# Patient Record
Sex: Male | Born: 1955
Health system: Southern US, Community
[De-identification: ages and names within clinical notes are randomized; demographics above are authoritative.]

## PROBLEM LIST (undated history)

## (undated) DIAGNOSIS — E119 Type 2 diabetes mellitus without complications: Secondary | ICD-10-CM

## (undated) DIAGNOSIS — F419 Anxiety disorder, unspecified: Secondary | ICD-10-CM

## (undated) DIAGNOSIS — Z860100 Personal history of colon polyps, unspecified: Secondary | ICD-10-CM

## (undated) DIAGNOSIS — M1990 Unspecified osteoarthritis, unspecified site: Secondary | ICD-10-CM

## (undated) HISTORY — DX: Personal history of colon polyps, unspecified: Z86.0100

## (undated) HISTORY — DX: Personal history of colonic polyps: Z86.010

---

## 2012-08-23 ENCOUNTER — Ambulatory Visit: Payer: Self-pay | Admitting: Family Medicine

## 2013-07-09 ENCOUNTER — Ambulatory Visit: Payer: Self-pay | Admitting: Family Medicine

## 2014-09-26 ENCOUNTER — Encounter: Payer: Self-pay | Admitting: *Deleted

## 2014-10-09 NOTE — Discharge Instructions (Signed)

## 2014-10-10 ENCOUNTER — Ambulatory Visit: Payer: BLUE CROSS/BLUE SHIELD | Admitting: Anesthesiology

## 2014-10-10 ENCOUNTER — Ambulatory Visit
Admission: RE | Admit: 2014-10-10 | Discharge: 2014-10-10 | Disposition: A | Discharge status: Home / Self Care | Payer: BLUE CROSS/BLUE SHIELD | Source: Ambulatory Visit | Attending: Gastroenterology | Admitting: Gastroenterology

## 2014-10-10 ENCOUNTER — Other Ambulatory Visit: Payer: Self-pay | Admitting: Gastroenterology

## 2014-10-10 ENCOUNTER — Encounter
Admission: RE | Disposition: A | Discharge status: Home / Self Care | Payer: Self-pay | Source: Ambulatory Visit | Attending: Gastroenterology

## 2014-10-10 DIAGNOSIS — F419 Anxiety disorder, unspecified: Secondary | ICD-10-CM | POA: Diagnosis not present

## 2014-10-10 DIAGNOSIS — M19049 Primary osteoarthritis, unspecified hand: Secondary | ICD-10-CM | POA: Insufficient documentation

## 2014-10-10 DIAGNOSIS — K640 First degree hemorrhoids: Secondary | ICD-10-CM | POA: Diagnosis not present

## 2014-10-10 DIAGNOSIS — K621 Rectal polyp: Secondary | ICD-10-CM | POA: Insufficient documentation

## 2014-10-10 DIAGNOSIS — F17200 Nicotine dependence, unspecified, uncomplicated: Secondary | ICD-10-CM | POA: Diagnosis not present

## 2014-10-10 DIAGNOSIS — Z79899 Other long term (current) drug therapy: Secondary | ICD-10-CM | POA: Insufficient documentation

## 2014-10-10 DIAGNOSIS — Z86010 Personal history of colonic polyps: Secondary | ICD-10-CM | POA: Diagnosis not present

## 2014-10-10 HISTORY — DX: Anxiety disorder, unspecified: F41.9

## 2014-10-10 HISTORY — PX: COLONOSCOPY: SHX5424

## 2014-10-10 HISTORY — DX: Unspecified osteoarthritis, unspecified site: M19.90

## 2014-10-10 HISTORY — PX: POLYPECTOMY: SHX5525

## 2014-10-10 SURGERY — COLONOSCOPY
Anesthesia: Monitor Anesthesia Care | Wound class: Contaminated

## 2014-10-10 MED ORDER — LIDOCAINE HCL (CARDIAC) 20 MG/ML IV SOLN
INTRAVENOUS | Status: DC | PRN
  Administered 2014-10-10: 50 mg via INTRAVENOUS

## 2014-10-10 MED ORDER — PROPOFOL 10 MG/ML IV BOLUS
INTRAVENOUS | Status: DC | PRN
  Administered 2014-10-10: 50 mg via INTRAVENOUS
  Administered 2014-10-10: 30 mg via INTRAVENOUS
  Administered 2014-10-10 (×2): 50 mg via INTRAVENOUS
  Administered 2014-10-10: 150 mg via INTRAVENOUS
  Administered 2014-10-10: 20 mg via INTRAVENOUS

## 2014-10-10 MED ORDER — LACTATED RINGERS IV SOLN
INTRAVENOUS | Status: DC
  Administered 2014-10-10 (×2): via INTRAVENOUS

## 2014-10-10 MED ORDER — STERILE WATER FOR IRRIGATION IR SOLN
Status: DC | PRN
  Administered 2014-10-10: 08:00:00

## 2014-10-10 SURGICAL SUPPLY — 28 items

## 2014-10-10 NOTE — Anesthesia Postprocedure Evaluation (Signed)
  Anesthesia Post-op Note  Patient: Daniel Snyder  Procedure(s) Performed: Procedure(s) with comments: COLONOSCOPY (N/A) - PT WANTS EARLY POLYPECTOMY  Anesthesia type:MAC  Patient location: PACU  Post pain: Pain level controlled  Post assessment: Post-op Vital signs reviewed, Patient's Cardiovascular Status Stable, Respiratory Function Stable, Patent Airway and No signs of Nausea or vomiting  Post vital signs: Reviewed and stable  Last Vitals:  Filed Vitals:   10/10/14 0830  BP: 122/82  Pulse: 82  Temp:   Resp: 21    Level of consciousness: awake, alert  and patient cooperative  Complications: No apparent anesthesia complications

## 2014-10-10 NOTE — Anesthesia Procedure Notes (Signed)
Procedure Name: MAC Performed by: Josanne Boerema Pre-anesthesia Checklist: Patient identified, Emergency Drugs available, Suction available, Patient being monitored and Timeout performed Patient Re-evaluated:Patient Re-evaluated prior to inductionOxygen Delivery Method: Nasal cannula Placement Confirmation: positive ETCO2 and breath sounds checked- equal and bilateral     

## 2014-10-10 NOTE — Anesthesia Preprocedure Evaluation (Signed)
Anesthesia Evaluation  Patient identified by MRN, date of birth, ID band Patient awake    Reviewed: Allergy & Precautions, NPO status , Patient's Chart, lab work & pertinent test results  Airway Mallampati: II  TM Distance: >3 FB Neck ROM: Full    Dental no notable dental hx.    Pulmonary neg pulmonary ROS, Current Smoker,  breath sounds clear to auscultation  Pulmonary exam normal       Cardiovascular negative cardio ROS Normal cardiovascular examRhythm:Regular Rate:Normal     Neuro/Psych Anxiety negative neurological ROS  negative psych ROS   GI/Hepatic negative GI ROS, Neg liver ROS,   Endo/Other  negative endocrine ROS  Renal/GU negative Renal ROS  negative genitourinary   Musculoskeletal negative musculoskeletal ROS (+) Arthritis -,   Abdominal   Peds negative pediatric ROS (+)  Hematology negative hematology ROS (+)   Anesthesia Other Findings   Reproductive/Obstetrics negative OB ROS                             Anesthesia Physical Anesthesia Plan  ASA: II  Anesthesia Plan: MAC   Post-op Pain Management:    Induction: Intravenous  Airway Management Planned: Natural Airway  Additional Equipment:   Intra-op Plan:   Post-operative Plan: Extubation in OR  Informed Consent: I have reviewed the patients History and Physical, chart, labs and discussed the procedure including the risks, benefits and alternatives for the proposed anesthesia with the patient or authorized representative who has indicated his/her understanding and acceptance.   Dental advisory given  Plan Discussed with: CRNA  Anesthesia Plan Comments:         Anesthesia Quick Evaluation

## 2014-10-10 NOTE — Op Note (Signed)
[facility] Patient Name: Daniel Snyder Procedure Date: 10/10/2014 7:54 AM MRN: [id number] Account #: [id number] Date of Birth: 12-02-1955 Admit Type: Outpatient Age: 59 Room: [facility] OR ROOM 01 Gender: Male Note Status: Finalized Procedure:         Colonoscopy Indications:       High risk colon cancer surveillance: Personal history of                     colonic polyps Providers:         [name], MD Referring MD:      Shelbie Ammons. Sherryll Burger (Referring MD) Medicines:         Propofol per Anesthesia Complications:     No immediate complications. Procedure:         Pre-Anesthesia Assessment:                    - Prior to the procedure, a History and Physical was                     performed, and patient medications and allergies were                     reviewed. The patient's tolerance of previous anesthesia                     was also reviewed. The risks and benefits of the procedure                     and the sedation options and risks were discussed with the                     patient. All questions were answered, and informed consent                     was obtained. Prior Anticoagulants: The patient has taken                     no previous anticoagulant or antiplatelet agents. ASA                     Grade Assessment: II - A patient with mild systemic                     disease. After reviewing the risks and benefits, the                     patient was deemed in satisfactory condition to undergo                     the procedure.                    After obtaining informed consent, the colonoscope was                     passed under direct vision. Throughout the procedure, the                     patient's blood pressure, pulse, and oxygen saturations                     were monitored continuously. The was introduced through  the anus and advanced to the the cecum, identified by                     appendiceal orifice and  ileocecal valve. The colonoscopy                     was performed without difficulty. The patient tolerated                     the procedure well. The quality of the bowel preparation                     was good. Findings:      The perianal and digital rectal examinations were normal.      Four sessile polyps were found in the rectum. The polyps were 2 to 3 mm       in size. These polyps were removed with a cold biopsy forceps. Resection       and retrieval were complete.      Non-bleeding internal hemorrhoids were found during retroflexion. The       hemorrhoids were Grade I (internal hemorrhoids that do not prolapse). Impression:        - Four 2 to 3 mm polyps in the rectum. Resected and                     retrieved.                    - Non-bleeding internal hemorrhoids. Recommendation:    - Await pathology results.                    - Repeat colonoscopy in 5 years for surveillance. Procedure Code(s): --- Professional ---                    [phone], Colonoscopy, flexible; with biopsy, single or                     multiple Diagnosis Code(s): --- Professional ---                    Z86.010, Personal history of colonic polyps                    K62.1, Rectal polyp CPT copyright 2014 American Medical Association. All rights reserved. The codes documented in this report are preliminary and upon coder review may  be revised to meet current compliance requirements. [name], MD 10/10/2014 8:15:27 AM This report has been signed electronically. Number of Addenda: 0 Note Initiated On: 10/10/2014 7:54 AM Scope Withdrawal Time: 0 hours 7 minutes 57 seconds  Total Procedure Duration: 0 hours 10 minutes 43 seconds       [facility]

## 2014-10-10 NOTE — H&P (Signed)
  [facility]  [address] [place], [place] [address] Phone: [phone] Fax : [phone]  Primary Care Physician:  No PCP Per Patient Primary Gastroenterologist:  Dr. Servando Snare  Pre-Procedure History & Physical: HPI:  Daniel Snyder is a 59 y.o. male is here for an colonoscopy.   Past Medical History  Diagnosis Date  . Arthritis     fingers  . Anxiety     History reviewed. No pertinent past surgical history.  Prior to Admission medications   Medication Sig Start Date End Date Taking? Authorizing Provider  ALPRAZolam Prudy Feeler) 0.5 MG tablet Take 0.5 mg by mouth as needed for anxiety.   Yes Historical Provider, MD  Cyanocobalamin (VITAMIN B12 PO) Take by mouth.   Yes Historical Provider, MD    Allergies as of 09/13/2014  . (Not on File)    History reviewed. No pertinent family history.  History   Social History  . Marital Status: Married    Spouse Name: N/A  . Number of Children: N/A  . Years of Education: N/A   Occupational History  . Not on file.   Social History Main Topics  . Smoking status: Current Every Day Smoker -- 0.50 packs/day for 20 years  . Smokeless tobacco: Not on file  . Alcohol Use: 2.4 oz/week    4 Cans of beer per week  . Drug Use: Not on file  . Sexual Activity: Not on file   Other Topics Concern  . Not on file   Social History Narrative  . No narrative on file    Review of Systems: See HPI, otherwise negative ROS  Physical Exam: BP 144/93 mmHg  Pulse 89  Temp(Src) 98.4 F (36.9 C) (Temporal)  Resp 16  Ht 5\' 9"  (1.753 m)  Wt 236 lb (107.049 kg)  BMI 34.84 kg/m2  SpO2 95% General:   Alert,  pleasant and cooperative in NAD Head:  Normocephalic and atraumatic. Neck:  Supple; no masses or thyromegaly. Lungs:  Clear throughout to auscultation.    Heart:  Regular rate and rhythm. Abdomen:  Soft, nontender and nondistended. Normal bowel sounds, without guarding, and without rebound.   Neurologic:  Alert and   oriented x4;  grossly normal neurologically.  Impression/Plan: Daniel Snyder is here for an colonoscopy to be performed for histroy of colon polyps  Risks, benefits, limitations, and alternatives regarding  colonoscopy have been reviewed with the patient.  Questions have been answered.  All parties agreeable.   Darlina Rumpf, MD  10/10/2014, 7:50 AM

## 2014-10-10 NOTE — Transfer of Care (Signed)
Immediate Anesthesia Transfer of Care Note  Patient: Daniel Snyder  Procedure(s) Performed: Procedure(s) with comments: COLONOSCOPY (N/A) - PT WANTS EARLY POLYPECTOMY  Patient Location: PACU  Anesthesia Type: MAC  Level of Consciousness: awake, alert  and patient cooperative  Airway and Oxygen Therapy: Patient Spontanous Breathing and Patient connected to supplemental oxygen  Post-op Assessment: Post-op Vital signs reviewed, Patient's Cardiovascular Status Stable, Respiratory Function Stable, Patent Airway and No signs of Nausea or vomiting  Post-op Vital Signs: Reviewed and stable  Complications: No apparent anesthesia complications

## 2014-10-11 ENCOUNTER — Encounter: Payer: Self-pay | Admitting: Gastroenterology

## 2014-10-22 ENCOUNTER — Encounter: Payer: Self-pay | Admitting: Gastroenterology

## 2015-08-27 ENCOUNTER — Ambulatory Visit (INDEPENDENT_AMBULATORY_CARE_PROVIDER_SITE_OTHER): Payer: BLUE CROSS/BLUE SHIELD | Admitting: Family Medicine

## 2015-08-27 ENCOUNTER — Encounter: Payer: Self-pay | Admitting: Family Medicine

## 2015-08-27 ENCOUNTER — Ambulatory Visit
Admission: RE | Admit: 2015-08-27 | Discharge: 2015-08-27 | Disposition: A | Discharge status: Home / Self Care | Payer: BLUE CROSS/BLUE SHIELD | Source: Ambulatory Visit | Attending: Family Medicine | Admitting: Family Medicine

## 2015-08-27 VITALS — BP 118/68 | HR 106 | Temp 99.4°F | Resp 18 | Ht 69.0 in | Wt 236.2 lb

## 2015-08-27 DIAGNOSIS — M25561 Pain in right knee: Secondary | ICD-10-CM | POA: Insufficient documentation

## 2015-08-27 DIAGNOSIS — M7989 Other specified soft tissue disorders: Secondary | ICD-10-CM | POA: Diagnosis not present

## 2015-08-27 DIAGNOSIS — F419 Anxiety disorder, unspecified: Secondary | ICD-10-CM

## 2015-08-27 DIAGNOSIS — M179 Osteoarthritis of knee, unspecified: Secondary | ICD-10-CM | POA: Diagnosis not present

## 2015-08-27 DIAGNOSIS — R208 Other disturbances of skin sensation: Secondary | ICD-10-CM | POA: Diagnosis not present

## 2015-08-27 DIAGNOSIS — R200 Anesthesia of skin: Secondary | ICD-10-CM

## 2015-08-27 DIAGNOSIS — M25421 Effusion, right elbow: Secondary | ICD-10-CM

## 2015-08-27 MED ORDER — ALPRAZOLAM 0.5 MG PO TABS: 0.5000 mg | ORAL_TABLET | Freq: Two times a day (BID) | ORAL | Status: DC | PRN

## 2015-08-27 MED ORDER — ALPRAZOLAM 0.5 MG PO TBDP: 0.5000 mg | ORAL_TABLET | Freq: Every day | ORAL | Status: DC | PRN

## 2015-08-27 NOTE — Progress Notes (Signed)
Name: Daniel FilaDavid K Snyder   MRN: [id number]    DOB: [birth year]   Date:08/27/2015       Progress Note  Subjective  Chief Complaint  Chief Complaint  Patient presents with  . Knee Pain    right knee and ankle  . Bursitis    in right elbow    Anxiety Presents for follow-up visit. The problem has been unchanged. Symptoms include excessive worry and insomnia. The quality of sleep is poor.   His past medical history is significant for anxiety/panic attacks. Past treatments include benzodiazephines.    Pt. Presents for right knee pain and swelling, intermittent, worse on certain days. Pain is worse with activity, relieved with rest and stretching. Feels his knee 'knots up' at times.  R. Elbow pain and swelling, started 2-3 months ago, pain went away but swelling persists, has become puffy. Patient wants it evaluated.  Past Medical History  Diagnosis Date  . Arthritis     fingers  . Anxiety     Past Surgical History  Procedure Laterality Date  . Colonoscopy N/A 10/10/2014    Procedure: COLONOSCOPY;  Surgeon: Midge Miniumarren Wohl, MD;  [facility];  Service: Gastroenterology;  Laterality: N/A;  PT WANTS EARLY  . Polypectomy  10/10/2014    Procedure: POLYPECTOMY;  Surgeon: Midge Miniumarren Wohl, MD;  [facility];  Service: Gastroenterology;;    No family history on file.  Social History   Social History  . Marital Status: Married    Spouse Name: N/A  . Number of Children: N/A  . Years of Education: N/A   Occupational History  . Not on file.   Social History Main Topics  . Smoking status: Current Every Day Smoker -- 0.50 packs/day for 20 years  . Smokeless tobacco: Not on file  . Alcohol Use: 2.4 oz/week    4 Cans of beer per week  . Drug Use: Not on file  . Sexual Activity: Not on file   Other Topics Concern  . Not on file   Social History Narrative    No current outpatient prescriptions on file.  No Known Allergies   Review of Systems   Constitutional: Negative for fever and chills.  Musculoskeletal: Positive for joint pain.  Psychiatric/Behavioral: The patient has insomnia.     Objective  Filed Vitals:   08/27/15 1521  BP: 118/68  Pulse: 106  Temp: 99.4 F (37.4 C)  Resp: 18  Height: 5\' 9"  (1.753 m)  Weight: 236 lb 4 oz (107.162 kg)  SpO2: 97%    Physical Exam  Constitutional: He is oriented to person, place, and time and well-developed, well-nourished, and in no distress.  Musculoskeletal:       Right elbow: He exhibits effusion. He exhibits normal range of motion. No tenderness found.       Right knee: He exhibits no swelling and no erythema. No tenderness found.       Right foot: There is no tenderness, no swelling, normal capillary refill and no deformity.  Boggy area of fluid accumulation in the right elbow, non tender to palpation, non erythematous, normal ROM of right elbow.  Neurological: He is alert and oriented to person, place, and time.  Nursing note and vitals reviewed.    Assessment & Plan  1. Swelling of right elbow joint  - DG Elbow Complete Right; Future - Ambulatory referral to Orthopedic Surgery  2. Right knee pain  - CBC with Differential - Comprehensive Metabolic Panel (CMET) - Uric acid -  DG Knee Complete 4 Views Right; Future - Ambulatory referral to Orthopedic Surgery  3. Numbness in feet We will rule out B12 deficiency has the cause for intermittent paresthesias in right foot. - B12  4. Anxiety Start on Xanax 0.5 mg 1 tablet twice daily for anxiety and insomnia. - ALPRAZolam (XANAX) 0.5 MG tablet; Take 1 tablet (0.5 mg total) by mouth 2 (two) times daily as needed for anxiety.  Dispense: 60 tablet; Refill: 0   Destyne Goodreau Asad A. Fay[facility] 08/27/2015 3:37 PM

## 2015-09-08 DIAGNOSIS — M179 Osteoarthritis of knee, unspecified: Secondary | ICD-10-CM | POA: Insufficient documentation

## 2015-09-08 DIAGNOSIS — M7020 Olecranon bursitis, unspecified elbow: Secondary | ICD-10-CM | POA: Insufficient documentation

## 2015-09-08 DIAGNOSIS — M1710 Unilateral primary osteoarthritis, unspecified knee: Secondary | ICD-10-CM | POA: Insufficient documentation

## 2015-09-08 DIAGNOSIS — M1711 Unilateral primary osteoarthritis, right knee: Secondary | ICD-10-CM | POA: Diagnosis not present

## 2015-09-30 DIAGNOSIS — M7021 Olecranon bursitis, right elbow: Secondary | ICD-10-CM | POA: Diagnosis not present

## 2016-03-09 ENCOUNTER — Encounter: Payer: Self-pay | Admitting: Family Medicine

## 2016-03-09 ENCOUNTER — Ambulatory Visit (INDEPENDENT_AMBULATORY_CARE_PROVIDER_SITE_OTHER): Payer: BLUE CROSS/BLUE SHIELD | Admitting: Family Medicine

## 2016-03-09 DIAGNOSIS — F419 Anxiety disorder, unspecified: Secondary | ICD-10-CM | POA: Diagnosis not present

## 2016-03-09 DIAGNOSIS — L03011 Cellulitis of right finger: Secondary | ICD-10-CM | POA: Diagnosis not present

## 2016-03-09 MED ORDER — MUPIROCIN CALCIUM 2 % EX CREA: 1.0000 "application " | TOPICAL_CREAM | Freq: Two times a day (BID) | CUTANEOUS | 0 refills | Status: DC

## 2016-03-09 MED ORDER — CEPHALEXIN 500 MG PO CAPS: 500.0000 mg | ORAL_CAPSULE | Freq: Three times a day (TID) | ORAL | 0 refills | Status: DC

## 2016-03-09 MED ORDER — ALPRAZOLAM 0.5 MG PO TABS: 0.5000 mg | ORAL_TABLET | Freq: Two times a day (BID) | ORAL | 0 refills | Status: DC | PRN

## 2016-03-09 NOTE — Progress Notes (Signed)
Name: Daniel FilaDavid K Sebo   MRN: [id number]    DOB: 1955/11/04   Date:03/09/2016       Progress Note  Subjective  Chief Complaint  Chief Complaint  Patient presents with  . Hand Injury    Cuts on right hand, index and middle finger pt states infection may have taken place on fingers x1 month    HPI  Pt. Presents for evaluation of two separate lesions on the right hand, first one on right hand index finger, started off a 'black spot' on the finger, slowly got worse and now has redness, peeling, some discharge, and is painful. He has been applying peroxide and Neosporin to the area. The second lesion is on the right middle finger, palmar aspect, started off at the same time, lesion has now turned black and is extending proximally down the finger. It is not draining but intensely painful. He has applied Peroxide and Neosporin to the lesion which did not help. No fever, chills, fatigue, or other constitutional symptoms  Past Medical History:  Diagnosis Date  . Anxiety   . Arthritis    fingers    Past Surgical History:  Procedure Laterality Date  . COLONOSCOPY N/A 10/10/2014   Procedure: COLONOSCOPY;  Surgeon: Midge Miniumarren Wohl, MD;  [facility];  Service: Gastroenterology;  Laterality: N/A;  PT WANTS EARLY  . POLYPECTOMY  10/10/2014   Procedure: POLYPECTOMY;  Surgeon: Midge Miniumarren Wohl, MD;  [facility];  Service: Gastroenterology;;    History reviewed. No pertinent family history.  Social History   Social History  . Marital status: Married    Spouse name: N/A  . Number of children: N/A  . Years of education: N/A   Occupational History  . Not on file.   Social History Main Topics  . Smoking status: Current Every Day Smoker    Packs/day: 0.50    Years: 20.00  . Smokeless tobacco: Never Used  . Alcohol use 2.4 oz/week    4 Cans of beer per week  . Drug use: Unknown  . Sexual activity: Not on file   Other Topics Concern  . Not on file   Social  History Narrative  . No narrative on file     Current Outpatient Prescriptions:  .  ALPRAZolam (XANAX) 0.5 MG tablet, Take 1 tablet (0.5 mg total) by mouth 2 (two) times daily as needed for anxiety., Disp: 60 tablet, Rfl: 0  No Known Allergies   Review of Systems  Constitutional: Negative for chills, fever and malaise/fatigue.    Objective  Vitals:   03/09/16 1403  BP: 120/72  Pulse: (!) 101  Resp: 18  Temp: 98.6 F (37 C)  TempSrc: Oral  SpO2: 96%  Weight: 240 lb 3.2 oz (109 kg)  Height: 5\' 9"  (1.753 m)    Physical Exam  Constitutional: He is oriented to person, place, and time and well-developed, well-nourished, and in no distress.  HENT:  Head: Normocephalic and atraumatic.  Musculoskeletal:       Hands: 1: erythematous tender fluid filled lesion on the right index finger, mildly tender to palpation.   2: dark black colored erythematous intensely tender lesion on the right middle finger, extending proximally down the digit, a small amount of purulent material was self-expressed by the patient.  Neurological: He is alert and oriented to person, place, and time.  Psychiatric: Mood, memory, affect and judgment normal.  Nursing note and vitals reviewed.    Assessment & Plan  1. Paronychia of finger  of right hand Advised to soak hand in chlorhexidine solution, started on oral and topical antimicrobial therapy - cephALEXin (KEFLEX) 500 MG capsule; Take 1 capsule (500 mg total) by mouth 3 (three) times daily.  Dispense: 30 capsule; Refill: 0 - mupirocin cream (BACTROBAN) 2 %; Apply 1 application topically 2 (two) times daily.  Dispense: 15 g; Refill: 0  2. Anxiety Stable and responsive to alprazolam when needed. Refills provided - ALPRAZolam (XANAX) 0.5 MG tablet; Take 1 tablet (0.5 mg total) by mouth 2 (two) times daily as needed for anxiety.  Dispense: 60 tablet; Refill: 0  Lucyann Romano Asad A. Faylene KurtzShah [facility] 03/09/2016 2:11  PM

## 2016-03-31 ENCOUNTER — Encounter: Payer: Self-pay | Admitting: Family Medicine

## 2016-03-31 ENCOUNTER — Ambulatory Visit (INDEPENDENT_AMBULATORY_CARE_PROVIDER_SITE_OTHER): Payer: BLUE CROSS/BLUE SHIELD | Admitting: Family Medicine

## 2016-03-31 VITALS — BP 140/70 | HR 97 | Temp 98.3°F | Resp 18 | Ht 69.0 in | Wt 241.1 lb

## 2016-03-31 DIAGNOSIS — J069 Acute upper respiratory infection, unspecified: Secondary | ICD-10-CM | POA: Diagnosis not present

## 2016-03-31 MED ORDER — HYDROCOD POLST-CPM POLST ER 10-8 MG/5ML PO SUER
5.0000 mL | Freq: Every evening | ORAL | 0 refills | Status: DC | PRN
Start: 2016-03-31 — End: 2016-05-05

## 2016-03-31 MED ORDER — AZITHROMYCIN 250 MG PO TABS: ORAL_TABLET | ORAL | 0 refills | Status: DC

## 2016-03-31 NOTE — Progress Notes (Signed)
Name: Daniel FilaDavid K Snyder   MRN: [id number]    DOB: [birth year]   Date:03/31/2016       Progress Note  Subjective  Chief Complaint  Chief Complaint  Patient presents with  . URI    cough, congested for 1 week  . Blister    on tongue for 1 week    URI   This is a new problem. The current episode started 1 to 4 weeks ago (10 days ago). The problem has been gradually improving. There has been no fever (initially had high fever with symptom onset, now resolved.). Associated symptoms include congestion and coughing. Pertinent negatives include no sinus pain. He has tried decongestant (Nyquil night time cough) for the symptoms. The treatment provided no relief.     Past Medical History:  Diagnosis Date  . Anxiety   . Arthritis    fingers    Past Surgical History:  Procedure Laterality Date  . COLONOSCOPY N/A 10/10/2014   Procedure: COLONOSCOPY;  Surgeon: Midge Miniumarren Wohl, MD;  [facility];  Service: Gastroenterology;  Laterality: N/A;  PT WANTS EARLY  . POLYPECTOMY  10/10/2014   Procedure: POLYPECTOMY;  Surgeon: Midge Miniumarren Wohl, MD;  [facility];  Service: Gastroenterology;;    No family history on file.  Social History   Social History  . Marital status: Married    Spouse name: N/A  . Number of children: N/A  . Years of education: N/A   Occupational History  . Not on file.   Social History Main Topics  . Smoking status: Current Every Day Smoker    Packs/day: 0.50    Years: 20.00  . Smokeless tobacco: Never Used  . Alcohol use 2.4 oz/week    4 Cans of beer per week  . Drug use: Unknown  . Sexual activity: Not on file   Other Topics Concern  . Not on file   Social History Narrative  . No narrative on file     Current Outpatient Prescriptions:  .  ALPRAZolam (XANAX) 0.5 MG tablet, Take 1 tablet (0.5 mg total) by mouth 2 (two) times daily as needed for anxiety., Disp: 60 tablet, Rfl: 0  No Known Allergies   Review of Systems  HENT:  Positive for congestion. Negative for sinus pain.   Respiratory: Positive for cough.    Please see history of present illness for complete discussion of review of systems  Objective  Vitals:   03/31/16 1119  BP: 140/70  Pulse: 97  Resp: 18  Temp: 98.3 F (36.8 C)  TempSrc: Oral  SpO2: 97%  Weight: 241 lb 1.6 oz (109.4 kg)  Height: 5\' 9"  (1.753 m)    Physical Exam  Constitutional: He is well-developed, well-nourished, and in no distress.  HENT:  Right Ear: Tympanic membrane and ear canal normal.  Left Ear: Tympanic membrane and ear canal normal.  Mouth/Throat: Posterior oropharyngeal erythema present. No oropharyngeal exudate or posterior oropharyngeal edema.    Ulcerated lesion on the left side of tongue, no drainage, appears to be a fever blister  Cardiovascular: Normal rate, regular rhythm and normal heart sounds.   No murmur heard. Pulmonary/Chest: Effort normal and breath sounds normal. He has no wheezes.  Nursing note and vitals reviewed.     Assessment & Plan  1. URI with cough and congestion Started on azithromycin and Tussionex for relief of coughing, oral lesion is likely a blister caused by the acute URI and should resolve. Reassured - azithromycin (ZITHROMAX) 250 MG tablet; 2  tabs po day 1, then 1 tab po q day x 4 days  Dispense: 6 tablet; Refill: 0 - chlorpheniramine-HYDROcodone (TUSSIONEX PENNKINETIC ER) 10-8 MG/5ML SUER; Take 5 mLs by mouth at bedtime as needed for cough.  Dispense: 50 mL; Refill: 0   Aslin Farinas Asad A. Faylene KurtzShah [facility] 03/31/2016 11:37 AM

## 2016-04-13 ENCOUNTER — Other Ambulatory Visit: Payer: Self-pay | Admitting: Family Medicine

## 2016-04-13 DIAGNOSIS — F419 Anxiety disorder, unspecified: Secondary | ICD-10-CM

## 2016-05-05 ENCOUNTER — Encounter: Payer: Self-pay | Admitting: Family Medicine

## 2016-05-05 ENCOUNTER — Ambulatory Visit (INDEPENDENT_AMBULATORY_CARE_PROVIDER_SITE_OTHER): Payer: BLUE CROSS/BLUE SHIELD | Admitting: Family Medicine

## 2016-05-05 DIAGNOSIS — Z0000 Encounter for general adult medical examination without abnormal findings: Secondary | ICD-10-CM | POA: Diagnosis not present

## 2016-05-05 DIAGNOSIS — E785 Hyperlipidemia, unspecified: Secondary | ICD-10-CM

## 2016-05-05 DIAGNOSIS — E1165 Type 2 diabetes mellitus with hyperglycemia: Secondary | ICD-10-CM | POA: Diagnosis not present

## 2016-05-05 DIAGNOSIS — E1169 Type 2 diabetes mellitus with other specified complication: Secondary | ICD-10-CM | POA: Insufficient documentation

## 2016-05-05 DIAGNOSIS — IMO0001 Reserved for inherently not codable concepts without codable children: Secondary | ICD-10-CM

## 2016-05-05 LAB — COMPLETE METABOLIC PANEL WITH GFR
ALT: 10 U/L (ref 9–46)
AST: 15 U/L (ref 10–35)
Albumin: 4.1 g/dL (ref 3.6–5.1)
Alkaline Phosphatase: 69 U/L (ref 40–115)
BUN: 7 mg/dL (ref 7–25)
CO2: 27 mmol/L (ref 20–31)
Calcium: 9.4 mg/dL (ref 8.6–10.3)
Chloride: 103 mmol/L (ref 98–110)
Creat: 0.85 mg/dL (ref 0.70–1.33)
GFR, Est African American: >89 mL/min (ref >=60)
GFR, Est Non African American: >89 mL/min (ref >=60)
Glucose, Bld: 201 mg/dL — ABNORMAL HIGH (ref 65–99)
Potassium: 4.5 mmol/L (ref 3.5–5.3)
Sodium: 136 mmol/L (ref 135–146)
Total Bilirubin: 0.8 mg/dL (ref 0.2–1.2)
Total Protein: 7.5 g/dL (ref 6.1–8.1)

## 2016-05-05 LAB — CBC WITH DIFFERENTIAL/PLATELET
Basophils Absolute: 0 cells/uL (ref 0–200)
Basophils Relative: 0 %
Eosinophils Absolute: 68 cells/uL (ref 15–500)
Eosinophils Relative: 2 %
HCT: 36.6 % — ABNORMAL LOW (ref 38.5–50.0)
Hemoglobin: 12.5 g/dL — ABNORMAL LOW (ref 13.2–17.1)
Lymphocytes Relative: 36 %
Lymphs Abs: 1224 cells/uL (ref 850–3900)
MCH: 30.4 pg (ref 27.0–33.0)
MCHC: 34.2 g/dL (ref 32.0–36.0)
MCV: 89.1 fL (ref 80.0–100.0)
MPV: 9.5 fL (ref 7.5–12.5)
Monocytes Absolute: 68 cells/uL — ABNORMAL LOW (ref 200–950)
Monocytes Relative: 2 %
Neutro Abs: 2040 cells/uL (ref 1500–7800)
Neutrophils Relative %: 60 %
Platelets: 234 10*3/uL (ref 140–400)
RBC: 4.11 MIL/uL — ABNORMAL LOW (ref 4.20–5.80)
RDW: 14.4 % (ref 11.0–15.0)
WBC: 3.4 10*3/uL — ABNORMAL LOW (ref 3.8–10.8)

## 2016-05-05 LAB — LIPID PANEL
Cholesterol: 234 mg/dL — ABNORMAL HIGH (ref <200)
HDL: 37 mg/dL — ABNORMAL LOW (ref >40)
LDL Cholesterol: 165 mg/dL — ABNORMAL HIGH (ref <100)
Total CHOL/HDL Ratio: 6.3 Ratio — ABNORMAL HIGH (ref <5.0)
Triglycerides: 161 mg/dL — ABNORMAL HIGH (ref <150)
VLDL: 32 mg/dL — ABNORMAL HIGH (ref <30)

## 2016-05-05 LAB — POCT GLYCOSYLATED HEMOGLOBIN (HGB A1C): Hemoglobin A1C: 7.7

## 2016-05-05 LAB — TSH: TSH: 1.37 mIU/L (ref 0.40–4.50)

## 2016-05-05 LAB — PSA: PSA: 0.1 ng/mL (ref <=4.0)

## 2016-05-05 MED ORDER — METFORMIN HCL 500 MG PO TABS: 500.0000 mg | ORAL_TABLET | Freq: Two times a day (BID) | ORAL | 0 refills | Status: DC

## 2016-05-05 NOTE — Progress Notes (Signed)
Name: Daniel FilaDavid K Snyder   MRN: [id number]    DOB: June 29, 1955   Date:05/05/2016       Progress Note  Subjective  Chief Complaint  Chief Complaint  Patient presents with  . Annual Exam    HPI  Pt. Presents for Complete Physical Exam. Last colonoscopy was June 2016, never had PSA or rectal exam for prostate.   Past Medical History:  Diagnosis Date  . Anxiety   . Arthritis    fingers    Past Surgical History:  Procedure Laterality Date  . COLONOSCOPY N/A 10/10/2014   Procedure: COLONOSCOPY;  Surgeon: Midge Miniumarren Wohl, MD;  [facility];  Service: Gastroenterology;  Laterality: N/A;  PT WANTS EARLY  . POLYPECTOMY  10/10/2014   Procedure: POLYPECTOMY;  Surgeon: Midge Miniumarren Wohl, MD;  [facility];  Service: Gastroenterology;;    No family history on file.  Social History   Social History  . Marital status: Married    Spouse name: N/A  . Number of children: N/A  . Years of education: N/A   Occupational History  . Not on file.   Social History Main Topics  . Smoking status: Current Every Day Smoker    Packs/day: 0.50    Years: 20.00  . Smokeless tobacco: Never Used  . Alcohol use 2.4 oz/week    4 Cans of beer per week  . Drug use: Unknown  . Sexual activity: Not on file   Other Topics Concern  . Not on file   Social History Narrative  . No narrative on file     Current Outpatient Prescriptions:  .  ALPRAZolam (XANAX) 0.5 MG tablet, TAKE 1 TABLET BY MOUTH TWICE A DAY AS NEEDED FOR ANXIETY, Disp: 60 tablet, Rfl: 0 .  meloxicam (MOBIC) 15 MG tablet, , Disp: , Rfl: 1  No Known Allergies   Review of Systems  Constitutional: Negative for chills, fever, malaise/fatigue and weight loss.  HENT: Positive for sore throat. Negative for congestion and nosebleeds.   Eyes: Negative for blurred vision and double vision.  Respiratory: Positive for cough (especially at night). Negative for sputum production and shortness of breath.   Cardiovascular:  Negative for chest pain and leg swelling.  Gastrointestinal: Negative for blood in stool, constipation and diarrhea.  Genitourinary: Negative for dysuria and hematuria.  Musculoskeletal: Positive for joint pain. Negative for back pain and neck pain.  Neurological: Negative for dizziness and headaches.  Psychiatric/Behavioral: Negative for depression. The patient is nervous/anxious and has insomnia.     Objective  Vitals:   05/05/16 0855  BP: 120/78  Pulse: 92  Resp: 18  Temp: 98.3 F (36.8 C)  TempSrc: Oral  SpO2: 97%  Weight: 238 lb 6.4 oz (108.1 kg)  Height: 5\' 9"  (1.753 m)    Physical Exam  Constitutional: He is oriented to person, place, and time and well-developed, well-nourished, and in no distress.  HENT:  Head: Normocephalic and atraumatic.  Cardiovascular: Normal rate, regular rhythm and normal heart sounds.   No murmur heard. Pulmonary/Chest: Effort normal and breath sounds normal. He has no wheezes.  Abdominal: Soft. Bowel sounds are normal. There is no tenderness. There is no rebound.  Genitourinary: Rectum normal. Rectal exam shows no external hemorrhoid. Prostate is not enlarged and not tender.  Neurological: He is alert and oriented to person, place, and time.  Psychiatric: Mood, memory, affect and judgment normal.  Nursing note and vitals reviewed.      Assessment & Plan  1. Annual physical  exam Obtain age-appropriate laboratory screenings, 12-lead EKG is normal sinus rhythm. - Lipid Profile - COMPLETE METABOLIC PANEL WITH GFR - CBC with Differential - TSH - Vitamin D (25 hydroxy) - PSA - POCT HgB A1C - EKG 12-Lead  2. Uncontrolled type 2 diabetes mellitus without complication, without long-term current use of insulin (HCC) A1c 7.7 percent, consistent with newly diagnosed uncontrolled diabetes, will start on metformin 500 mg twice a day, advised on dietary and lifestyle changes including decreasing concentrated sweets, trying to increase physical  activity, recheck A1c in 3 month - metFORMIN (GLUCOPHAGE) 500 MG tablet; Take 1 tablet (500 mg total) by mouth 2 (two) times daily with a meal.  Dispense: 180 tablet; Refill: 0   [name] Asad A[facility] 05/05/2016 8:59 AM

## 2016-05-06 LAB — VITAMIN D 25 HYDROXY (VIT D DEFICIENCY, FRACTURES): Vit D, 25-Hydroxy: 16 ng/mL — ABNORMAL LOW (ref 30–100)

## 2016-05-07 ENCOUNTER — Telehealth: Payer: Self-pay

## 2016-05-07 MED ORDER — ROSUVASTATIN CALCIUM 10 MG PO TABS: 10.0000 mg | ORAL_TABLET | Freq: Every day | ORAL | 0 refills | Status: DC

## 2016-05-07 MED ORDER — VITAMIN D (ERGOCALCIFEROL) 1.25 MG (50000 UNIT) PO CAPS: 50000.0000 [IU] | ORAL_CAPSULE | ORAL | 0 refills | Status: DC

## 2016-05-07 NOTE — Telephone Encounter (Signed)
Patient has been notified of lab results and prescriptions for vitamin D3 50,000 units take 1 capsule once a week for 12 weeks and rosuvastatin 10 mg daily at bedtime has been sent to [facility] [address] [address] per Dr. Sherryll BurgerShah, patient has been notified and verbalized understanding

## 2016-06-14 ENCOUNTER — Telehealth: Payer: Self-pay | Admitting: Family Medicine

## 2016-06-14 DIAGNOSIS — F419 Anxiety disorder, unspecified: Secondary | ICD-10-CM

## 2016-06-14 MED ORDER — ALPRAZOLAM 0.5 MG PO TABS: ORAL_TABLET | ORAL | 0 refills | Status: DC

## 2016-06-14 NOTE — Telephone Encounter (Signed)
Prescription for Alprazolam is printed and ready for pickup

## 2016-06-14 NOTE — Telephone Encounter (Signed)
Pt requesting refill on Alprazolam.

## 2016-06-14 NOTE — Telephone Encounter (Signed)
Pt informed

## 2016-07-16 ENCOUNTER — Encounter: Payer: Self-pay | Admitting: Family Medicine

## 2016-07-16 ENCOUNTER — Ambulatory Visit (INDEPENDENT_AMBULATORY_CARE_PROVIDER_SITE_OTHER): Payer: BLUE CROSS/BLUE SHIELD | Admitting: Family Medicine

## 2016-07-16 VITALS — BP 121/73 | HR 95 | Temp 97.8°F | Resp 17 | Ht 69.0 in | Wt 234.3 lb

## 2016-07-16 DIAGNOSIS — F419 Anxiety disorder, unspecified: Secondary | ICD-10-CM

## 2016-07-16 MED ORDER — ALPRAZOLAM 0.5 MG PO TABS: ORAL_TABLET | ORAL | 2 refills | Status: DC

## 2016-07-16 NOTE — Progress Notes (Signed)
Name: Daniel Snyder   MRN: [id number]    DOB: [birth year]   Date:07/16/2016       Progress Note  Subjective  Chief Complaint  Chief Complaint  Patient presents with  . Follow-up    3 mo DM  . Medication Refill    Anxiety  Presents for follow-up visit. The problem has been unchanged. Symptoms include depressed mood, excessive worry, insomnia and nervous/anxious behavior. Patient reports no decreased concentration or panic. The severity of symptoms is moderate and causing significant distress. The quality of sleep is poor.   His past medical history is significant for anxiety/panic attacks. Past treatments include benzodiazephines.     Past Medical History:  Diagnosis Date  . Anxiety   . Arthritis    fingers    Past Surgical History:  Procedure Laterality Date  . COLONOSCOPY N/A 10/10/2014   Procedure: COLONOSCOPY;  Surgeon: Midge Miniumarren Wohl, MD;  [facility];  Service: Gastroenterology;  Laterality: N/A;  PT WANTS EARLY  . POLYPECTOMY  10/10/2014   Procedure: POLYPECTOMY;  Surgeon: Midge Miniumarren Wohl, MD;  [facility];  Service: Gastroenterology;;    History reviewed. No pertinent family history.  Social History   Social History  . Marital status: Married    Spouse name: N/A  . Number of children: N/A  . Years of education: N/A   Occupational History  . Not on file.   Social History Main Topics  . Smoking status: Current Every Day Smoker    Packs/day: 0.50    Years: 20.00  . Smokeless tobacco: Never Used  . Alcohol use 2.4 oz/week    4 Cans of beer per week  . Drug use: Unknown  . Sexual activity: Not on file   Other Topics Concern  . Not on file   Social History Narrative  . No narrative on file     Current Outpatient Prescriptions:  .  ALPRAZolam (XANAX) 0.5 MG tablet, TAKE 1 TABLET BY MOUTH TWICE A DAY AS NEEDED FOR ANXIETY, Disp: 60 tablet, Rfl: 0 .  meloxicam (MOBIC) 15 MG tablet, , Disp: , Rfl: 1 .  metFORMIN (GLUCOPHAGE)  500 MG tablet, Take 1 tablet (500 mg total) by mouth 2 (two) times daily with a meal., Disp: 180 tablet, Rfl: 0 .  rosuvastatin (CRESTOR) 10 MG tablet, Take 1 tablet (10 mg total) by mouth at bedtime., Disp: 90 tablet, Rfl: 0 .  Vitamin D, Ergocalciferol, (DRISDOL) 50000 units CAPS capsule, Take 1 capsule (50,000 Units total) by mouth once a week. For 12 weeks, Disp: 12 capsule, Rfl: 0  No Known Allergies   Review of Systems  Psychiatric/Behavioral: Negative for decreased concentration. The patient is nervous/anxious and has insomnia.      Objective  Vitals:   07/16/16 1013  BP: 121/73  Pulse: 95  Resp: 17  Temp: 97.8 F (36.6 C)  TempSrc: Oral  SpO2: 96%  Weight: 234 lb 4.8 oz (106.3 kg)  Height: 5\' 9"  (1.753 m)    Physical Exam  Constitutional: He is oriented to person, place, and time and well-developed, well-nourished, and in no distress.  Cardiovascular: Normal rate, regular rhythm and normal heart sounds.   No murmur heard. Pulmonary/Chest: Effort normal and breath sounds normal. He has no wheezes.  Musculoskeletal: Normal range of motion.  Neurological: He is alert and oriented to person, place, and time.  Psychiatric: Mood, memory, affect and judgment normal.  Nursing note and vitals reviewed.      Assessment & Plan  1. Anxiety Symptoms of anxiety are stable and controlled on alprazolam taken twice daily as needed. Refills provided - ALPRAZolam (XANAX) 0.5 MG tablet; TAKE 1 TABLET BY MOUTH TWICE A DAY AS NEEDED FOR ANXIETY  Dispense: 60 tablet; Refill: 2   Jakota Manthei Asad A[facility] 07/16/2016 10:23 AM

## 2016-07-16 NOTE — Addendum Note (Signed)
Addended bySherryll Burger: Karol Skarzynski A A on: 07/16/2016 03:44 PM   Modules accepted: Level of Service

## 2016-07-28 ENCOUNTER — Other Ambulatory Visit: Payer: Self-pay | Admitting: Family Medicine

## 2016-07-31 ENCOUNTER — Other Ambulatory Visit: Payer: Self-pay | Admitting: Family Medicine

## 2016-07-31 DIAGNOSIS — E1165 Type 2 diabetes mellitus with hyperglycemia: Principal | ICD-10-CM

## 2016-07-31 DIAGNOSIS — IMO0001 Reserved for inherently not codable concepts without codable children: Secondary | ICD-10-CM

## 2016-08-02 ENCOUNTER — Other Ambulatory Visit: Payer: Self-pay | Admitting: Family Medicine

## 2016-08-03 ENCOUNTER — Other Ambulatory Visit: Payer: Self-pay | Admitting: Family Medicine

## 2016-08-03 ENCOUNTER — Ambulatory Visit: Payer: BLUE CROSS/BLUE SHIELD | Admitting: Family Medicine

## 2016-08-11 ENCOUNTER — Encounter: Payer: Self-pay | Admitting: Family Medicine

## 2016-08-11 ENCOUNTER — Ambulatory Visit (INDEPENDENT_AMBULATORY_CARE_PROVIDER_SITE_OTHER): Payer: BLUE CROSS/BLUE SHIELD | Admitting: Family Medicine

## 2016-08-11 DIAGNOSIS — E559 Vitamin D deficiency, unspecified: Secondary | ICD-10-CM | POA: Diagnosis not present

## 2016-08-11 DIAGNOSIS — E119 Type 2 diabetes mellitus without complications: Secondary | ICD-10-CM | POA: Diagnosis not present

## 2016-08-11 DIAGNOSIS — E785 Hyperlipidemia, unspecified: Secondary | ICD-10-CM | POA: Insufficient documentation

## 2016-08-11 DIAGNOSIS — E782 Mixed hyperlipidemia: Secondary | ICD-10-CM | POA: Diagnosis not present

## 2016-08-11 LAB — GLUCOSE, POCT (MANUAL RESULT ENTRY): POC Glucose: 134 mg/dl — AB (ref 70–99)

## 2016-08-11 LAB — POCT GLYCOSYLATED HEMOGLOBIN (HGB A1C): Hemoglobin A1C: 6.8

## 2016-08-11 MED ORDER — METFORMIN HCL 500 MG PO TABS: 500.0000 mg | ORAL_TABLET | Freq: Two times a day (BID) | ORAL | 0 refills | Status: DC

## 2016-08-11 NOTE — Progress Notes (Signed)
Name: Daniel Snyder   MRN: [id number]    DOB: 1955/10/09   Date:08/11/2016       Progress Note  Subjective  Chief Complaint  Chief Complaint  Patient presents with  . Follow-up    1 mo  . Diabetes  . Medication Refill    Diabetes  He presents for his follow-up diabetic visit. He has type 2 diabetes mellitus. His disease course has been stable. There are no hypoglycemic associated symptoms. Pertinent negatives for diabetes include no foot paresthesias, no polydipsia and no polyuria. His weight is stable. He is following a diabetic diet. Frequency home blood tests: does not check blood glucose at home. An ACE inhibitor/angiotensin II receptor blocker is not being taken.  Hyperlipidemia  This is a chronic problem. The problem is uncontrolled. Recent lipid tests were reviewed and are high. Pertinent negatives include no leg pain, myalgias or shortness of breath. Current antihyperlipidemic treatment includes statins.     Past Medical History:  Diagnosis Date  . Anxiety   . Arthritis    fingers    Past Surgical History:  Procedure Laterality Date  . COLONOSCOPY N/A 10/10/2014   Procedure: COLONOSCOPY;  Surgeon: [name], MD;  Location: [facility];  Service: Gastroenterology;  Laterality: N/A;  PT WANTS EARLY  . POLYPECTOMY  10/10/2014   Procedure: POLYPECTOMY;  Surgeon: [name], MD;  Location: [facility];  Service: Gastroenterology;;    History reviewed. No pertinent family history.  Social History   Social History  . Marital status: Married    Spouse name: N/A  . Number of children: N/A  . Years of education: N/A   Occupational History  . Not on file.   Social History Main Topics  . Smoking status: Current Every Day Smoker    Packs/day: 0.50    Years: 20.00  . Smokeless tobacco: Never Used  . Alcohol use 2.4 oz/week    4 Cans of beer per week  . Drug use: Unknown  . Sexual activity: Not on file   Other Topics Concern  . Not on file    Social History Narrative  . No narrative on file     Current Outpatient Prescriptions:  .  ALPRAZolam (XANAX) 0.5 MG tablet, TAKE 1 TABLET BY MOUTH TWICE A DAY AS NEEDED FOR ANXIETY, Disp: 60 tablet, Rfl: 2 .  meloxicam (MOBIC) 15 MG tablet, , Disp: , Rfl: 1 .  metFORMIN (GLUCOPHAGE) 500 MG tablet, TAKE 1 TABLET (500 MG TOTAL) BY MOUTH 2 (TWO) TIMES DAILY WITH A MEAL., Disp: 180 tablet, Rfl: 0 .  rosuvastatin (CRESTOR) 10 MG tablet, TAKE 1 TABLET (10 MG TOTAL) BY MOUTH AT BEDTIME., Disp: 90 tablet, Rfl: 0 .  Vitamin D, Ergocalciferol, (DRISDOL) 50000 units CAPS capsule, Take 1 capsule (50,000 Units total) by mouth once a week. For 12 weeks (Patient not taking: Reported on 08/11/2016), Disp: 12 capsule, Rfl: 0  No Known Allergies   Review of Systems  Respiratory: Negative for shortness of breath.   Musculoskeletal: Negative for myalgias.  Endo/Heme/Allergies: Negative for polydipsia.     Objective  Vitals:   08/11/16 0826  BP: 121/70  Pulse: (!) 105  Resp: 17  Temp: 99.1 F (37.3 C)  TempSrc: Oral  SpO2: 97%  Weight: 232 lb 6.4 oz (105.4 kg)  Height:  (1.753 m)    Physical Exam  Constitutional: He is oriented to person, place, and time and well-developed, well-nourished, and in no distress.  HENT:  Head: Normocephalic and  atraumatic.  Cardiovascular: Normal rate, regular rhythm and normal heart sounds.   No murmur heard. Pulmonary/Chest: Effort normal and breath sounds normal. He has no wheezes. He has no rhonchi.  Abdominal: Soft. Bowel sounds are normal. There is no tenderness. There is no rebound.  Neurological: He is alert and oriented to person, place, and time.  Psychiatric: Mood, memory, affect and judgment normal.  Nursing note and vitals reviewed.      Assessment & Plan  1. Controlled type 2 diabetes mellitus without complication, without long-term current use of insulin (HCC) A1c is well controlled at 6.8%, showing compliant with dietary  changes for diabetes, referred to ophthalmology for eye exam and obtain urine microalbumin. - metFORMIN (GLUCOPHAGE) 500 MG tablet; Take 1 tablet (500 mg total) by mouth 2 (two) times daily with a meal.  Dispense: 180 tablet; Refill: 0 - Urine Microalbumin w/creat. ratio - Ambulatory referral to Ophthalmology - POCT HgB A1C - POCT Glucose (CBG)  2. Mixed hyperlipidemia Started on statin 2 weeks ago, make dietary and lifestyle changes, recheck in 3 months  3. Vitamin D deficiency Finished taking 12 weeks of vitamin D 50,000 units. Recheck today - VITAMIN D 25 Hydroxy (Vit-D Deficiency, Fractures)   Parry Po Asad A[facility] 08/11/2016 8:31 AM

## 2016-08-12 LAB — MICROALBUMIN / CREATININE URINE RATIO
Creatinine, Urine: 351 mg/dL (ref 20–370)
Microalb Creat Ratio: 887 mcg/mg creat — ABNORMAL HIGH (ref <30)
Microalb, Ur: 311.2 mg/dL

## 2016-08-12 LAB — VITAMIN D 25 HYDROXY (VIT D DEFICIENCY, FRACTURES): Vit D, 25-Hydroxy: 33 ng/mL (ref 30–100)

## 2016-08-17 ENCOUNTER — Ambulatory Visit: Payer: BLUE CROSS/BLUE SHIELD | Admitting: Family Medicine

## 2016-08-18 ENCOUNTER — Telehealth: Payer: Self-pay

## 2016-08-18 MED ORDER — LISINOPRIL 5 MG PO TABS: 5.0000 mg | ORAL_TABLET | Freq: Every day | ORAL | 0 refills | Status: DC

## 2016-08-18 NOTE — Telephone Encounter (Signed)
Patient has been notified of lab results and a prescription for lisinopril 5 mg daily has been sent to [facility] per Dr. Sherryll Burger, patient has been notified and verbalized understanding

## 2016-09-06 ENCOUNTER — Ambulatory Visit (INDEPENDENT_AMBULATORY_CARE_PROVIDER_SITE_OTHER): Payer: BLUE CROSS/BLUE SHIELD | Admitting: Family Medicine

## 2016-09-06 ENCOUNTER — Encounter: Payer: Self-pay | Admitting: Family Medicine

## 2016-09-06 VITALS — BP 120/70 | HR 96 | Temp 98.4°F | Resp 16 | Ht 69.0 in | Wt 227.0 lb

## 2016-09-06 DIAGNOSIS — J159 Unspecified bacterial pneumonia: Secondary | ICD-10-CM

## 2016-09-06 DIAGNOSIS — J9801 Acute bronchospasm: Secondary | ICD-10-CM | POA: Diagnosis not present

## 2016-09-06 DIAGNOSIS — R05 Cough: Secondary | ICD-10-CM | POA: Diagnosis not present

## 2016-09-06 DIAGNOSIS — R059 Cough, unspecified: Secondary | ICD-10-CM

## 2016-09-06 MED ORDER — AZITHROMYCIN 250 MG PO TABS: ORAL_TABLET | ORAL | 0 refills | Status: DC

## 2016-09-06 MED ORDER — HYDROCOD POLST-CPM POLST ER 10-8 MG/5ML PO SUER
5.0000 mL | Freq: Every day | ORAL | 0 refills | Status: DC
Start: 2016-09-06 — End: 2016-11-10

## 2016-09-06 MED ORDER — BENZONATATE 100 MG PO CAPS: 100.0000 mg | ORAL_CAPSULE | Freq: Three times a day (TID) | ORAL | 0 refills | Status: DC | PRN

## 2016-09-06 MED ORDER — ALBUTEROL SULFATE HFA 108 (90 BASE) MCG/ACT IN AERS: 2.0000 | INHALATION_SPRAY | Freq: Four times a day (QID) | RESPIRATORY_TRACT | 0 refills | Status: DC | PRN

## 2016-09-06 NOTE — Patient Instructions (Addendum)
Community-Acquired Pneumonia, Adult °Pneumonia is an infection of the lungs. One type of pneumonia can happen while a person is in a hospital. A different type can happen when a person is not in a hospital (community-acquired pneumonia). It is easy for this kind to spread from person to person. It can spread to you if you breathe near an infected person who coughs or sneezes. Some symptoms include: °· A dry cough. °· A wet (productive) cough. °· Fever. °· Sweating. °· Chest pain. °Follow these instructions at home: °· Take over-the-counter and prescription medicines only as told by your doctor. °¨ Only take cough medicine if you are losing sleep. °¨ If you were prescribed an antibiotic medicine, take it as told by your doctor. Do not stop taking the antibiotic even if you start to feel better. °· Sleep with your head and neck raised (elevated). You can do this by putting a few pillows under your head, or you can sleep in a recliner. °· Do not use tobacco products. These include cigarettes, chewing tobacco, and e-cigarettes. If you need help quitting, ask your doctor. °· Drink enough water to keep your pee (urine) clear or pale yellow. °A shot (vaccine) can help prevent pneumonia. Shots are often suggested for: °· People older than 59 years of age. °· People older than 59 years of age: °¨ Who are having cancer treatment. °¨ Who have long-term (chronic) lung disease. °¨ Who have problems with their body's defense system (immune system). °You may also prevent pneumonia if you take these actions: °· Get the flu (influenza) shot every year. °· Go to the dentist as often as told. °· Wash your hands often. If soap and water are not available, use hand sanitizer. °Contact a doctor if: °· You have a fever. °· You lose sleep because your cough medicine does not help. °Get help right away if: °· You are short of breath and it gets worse. °· You have more chest pain. °· Your sickness gets worse. This is very serious if: °¨ You  are an older adult. °¨ Your body's defense system is weak. °· You cough up blood. °This information is not intended to replace advice given to you by your health care provider. Make sure you discuss any questions you have with your health care provider. °Document Released: 09/29/2007 Document Revised: 09/18/2015 Document Reviewed: 08/07/2014 °Elsevier Interactive Patient Education © 2017 [employer] Inc[employer] ° °

## 2016-09-06 NOTE — Progress Notes (Addendum)
Name: Daniel Snyder   MRN: [id number]    DOB: 10/30/1955   Date:09/06/2016       Progress Note  Subjective  Chief Complaint  Chief Complaint  Patient presents with  . Cough    scratchy throat, no energy for 10 days    HPI  Pt presents with 11-12 day history of cough, sore throat, subjective fevers/chills and worsening fatigue. Cough is worse at night when he lays down.  No nasal congestion, sinus pain, ear pain/pressure, shortness of breath, or chest pain. No abdominal pain, NVD, or body aches. Pt is current smoker - 1/2-1ppd depending on the day. Pt is not ready to quit smoking.  Patient Active Problem List   Diagnosis Date Noted  . Hyperlipidemia 08/11/2016  . Vitamin D deficiency 08/11/2016  . Annual physical exam 05/05/2016  . Diabetes mellitus type 2, uncontrolled (HCC) 05/05/2016  . URI with cough and congestion 03/31/2016  . Paronychia of finger of right hand 03/09/2016  . Swelling of right elbow joint 08/27/2015  . Right knee pain 08/27/2015  . Numbness in feet 08/27/2015  . Anxiety 08/27/2015  . Hx of colonic polyps   . Rectal polyp     Social History  Substance Use Topics  . Smoking status: Current Every Day Smoker    Packs/day: 0.50    Years: 20.00  . Smokeless tobacco: Never Used  . Alcohol use 2.4 oz/week    4 Cans of beer per week     Current Outpatient Prescriptions:  .  ALPRAZolam (XANAX) 0.5 MG tablet, TAKE 1 TABLET BY MOUTH TWICE A DAY AS NEEDED FOR ANXIETY, Disp: 60 tablet, Rfl: 2 .  lisinopril (PRINIVIL,ZESTRIL) 5 MG tablet, Take 1 tablet (5 mg total) by mouth daily., Disp: 90 tablet, Rfl: 0 .  meloxicam (MOBIC) 15 MG tablet, , Disp: , Rfl: 1 .  metFORMIN (GLUCOPHAGE) 500 MG tablet, Take 1 tablet (500 mg total) by mouth 2 (two) times daily with a meal., Disp: 180 tablet, Rfl: 0 .  rosuvastatin (CRESTOR) 10 MG tablet, TAKE 1 TABLET (10 MG TOTAL) BY MOUTH AT BEDTIME., Disp: 90 tablet, Rfl: 0 .  Vitamin D, Ergocalciferol, (DRISDOL) 50000 units CAPS  capsule, Take 1 capsule (50,000 Units total) by mouth once a week. For 12 weeks, Disp: 12 capsule, Rfl: 0  No Known Allergies  ROS  Constitutional: Positive for subjective fevers/chills and for fatigue; negative for weight change.  Respiratory: Positive for cough and negative shortness of breath.   HEENT: See HPI Cardiovascular: Negative for chest pain or palpitations.  Gastrointestinal: Negative for abdominal pain, no bowel changes.  Musculoskeletal: Negative for gait problem or joint swelling.  Skin: Negative for rash.  Neurological: Negative for dizziness or headache.  No other specific complaints in a complete review of systems (except as listed in HPI above).  Objective  Vitals:   09/06/16 1102  BP: 120/70  Pulse: 96  Resp: 16  Temp: 98.4 F (36.9 C)  TempSrc: Oral  SpO2: 96%  Weight: 227 lb (103 kg)  Height: 5\' 9"  (1.753 m)    Body mass index is 33.52 kg/m.  Nursing Note and Vital Signs reviewed.  Physical Exam  Constitutional: Patient appears well-developed and well-nourished. Obese No distress.  HEENT: head atraumatic, normocephalic, pupils equal and reactive to light, EOM's intact, TM's without erythema or bulging, no maxillary or frontal sinus pain on palpation, neck supple without lymphadenopathy, oropharynx pink and moist without exudate Cardiovascular: Normal rate, regular rhythm, S1/S2 present.  No murmur  or rub heard. No BLE edema. Pulmonary/Chest: Effort norma, coughing throughout visit without production; Lung sounds diminished in bases with fine crackle to RLL, otherwise clear. No respiratory distress or retractions. Psychiatric: Patient has a normal mood and affect. behavior is normal. Judgment and thought content normal.  Recent Results (from the past 2160 hour(s))  Urine Microalbumin w/creat. ratio     Status: Abnormal   Collection Time: 08/11/16  8:43 AM  Result Value Ref Range   Creatinine, Urine 351 20 - 370 mg/dL    Comment: Result repeated  and verified. Result confirmed by automatic dilution.    Microalb, Ur 311.2 Not estab mg/dL    Comment: Result repeated and verified. Result confirmed by automatic dilution.    Microalb Creat Ratio 887 (H) <30 mcg/mg creat    Comment: The ADA has defined abnormalities in albumin excretion as follows:           Category           Result                            (mcg/mg creatinine)                 Normal:    <30       Microalbuminuria:    30 - 299   Clinical albuminuria:    > or = 300   The ADA recommends that at least two of three specimens collected within a 3 - 6 month period be abnormal before considering a patient to be within a diagnostic category.     POCT HgB A1C     Status: Abnormal   Collection Time: 08/11/16  8:48 AM  Result Value Ref Range   Hemoglobin A1C 6.8   POCT Glucose (CBG)     Status: Abnormal   Collection Time: 08/11/16  8:48 AM  Result Value Ref Range   POC Glucose 134 (A) 70 - 99 mg/dl  VITAMIN D 25 Hydroxy (Vit-D Deficiency, Fractures)     Status: None   Collection Time: 08/11/16  8:54 AM  Result Value Ref Range   Vit D, 25-Hydroxy 33 30 - 100 ng/mL    Comment: Vitamin D Status           25-OH Vitamin D        Deficiency                <20 ng/mL        Insufficiency         20 - 29 ng/mL        Optimal             > or = 30 ng/mL   For 25-OH Vitamin D testing on patients on D2-supplementation and patients for whom quantitation of D2 and D3 fractions is required, the QuestAssureD 25-OH VIT D, (D2,D3), LC/MS/MS is recommended: order code 40981 (patients > 2 yrs).      Assessment & Plan  1. Community acquired bacterial pneumonia - azithromycin (ZITHROMAX) 250 MG tablet; Take 2 pills on day 1, Then 1 pill daily on days 2-5.  Dispense: 6 tablet; Refill: 0  2. Bronchospasm - benzonatate (TESSALON) 100 MG capsule; Take 1 capsule (100 mg total) by mouth 3 (three) times daily as needed for cough.  Dispense: 20 capsule; Refill: 0 -  chlorpheniramine-HYDROcodone (TUSSIONEX PENNKINETIC ER) 10-8 MG/5ML SUER; Take 5 mLs by mouth at bedtime.  Dispense: 50 mL; Refill: 0 -  albuterol (PROVENTIL HFA;VENTOLIN HFA) 108 (90 Base) MCG/ACT inhaler; Inhale 2 puffs into the lungs every 6 (six) hours as needed for wheezing or shortness of breath.  Dispense: 1 Inhaler; Refill: 0  3. Cough - benzonatate (TESSALON) 100 MG capsule; Take 1 capsule (100 mg total) by mouth 3 (three) times daily as needed for cough.  Dispense: 20 capsule; Refill: 0 - chlorpheniramine-HYDROcodone (TUSSIONEX PENNKINETIC ER) 10-8 MG/5ML SUER; Take 5 mLs by mouth at bedtime.  Dispense: 50 mL; Refill: 0  -Pt in agreement to avoid use of Xanax while taking Tussionex. Discussed risk/benefit of this medication, pt has done well with it in the past.  -Will check Spirometry at next visit. -Follow up in 3-5 days if no improvement. -Will think about quitting smoking. -Red flags and when to present for emergency care or RTC including fever >101.91F, chest pain, shortness of breath, new/worsening/un-resolving symptoms, reviewed with patient at time of visit. Follow up and care instructions discussed and provided in AVS. -Reviewed Health Maintenance: Going to Eye Exam on September 29 2016, declines HIV/Hep C screening, declines immunizations.  I have reviewed this encounter including the documentation in this note and/or discussed this patient with the Deboraha Sprang, FNP, NP-C. I am certifying that I agree with the content of this note as supervising physician.  Alba Cory, MD [facility] 09/06/2016, 5:22 PM

## 2016-10-29 ENCOUNTER — Other Ambulatory Visit: Payer: Self-pay | Admitting: Family Medicine

## 2016-11-01 NOTE — Telephone Encounter (Signed)
I don't see a recheck lipid panel or SGPT since statin was started I'll refill, but please order a fasting lipid panel and SGPT (ALT) and ask patient to come by for fasting blood work in the next week Thank you

## 2016-11-03 ENCOUNTER — Other Ambulatory Visit: Payer: Self-pay | Admitting: Family Medicine

## 2016-11-10 ENCOUNTER — Encounter: Payer: Self-pay | Admitting: Family Medicine

## 2016-11-10 ENCOUNTER — Ambulatory Visit (INDEPENDENT_AMBULATORY_CARE_PROVIDER_SITE_OTHER): Payer: BLUE CROSS/BLUE SHIELD | Admitting: Family Medicine

## 2016-11-10 VITALS — BP 120/74 | HR 88 | Temp 97.6°F | Resp 17 | Ht 69.0 in | Wt 244.3 lb

## 2016-11-10 DIAGNOSIS — F419 Anxiety disorder, unspecified: Secondary | ICD-10-CM | POA: Diagnosis not present

## 2016-11-10 DIAGNOSIS — E119 Type 2 diabetes mellitus without complications: Secondary | ICD-10-CM

## 2016-11-10 DIAGNOSIS — E782 Mixed hyperlipidemia: Secondary | ICD-10-CM

## 2016-11-10 LAB — LIPID PANEL
Cholesterol: 203 mg/dL — ABNORMAL HIGH (ref <200)
HDL: 39 mg/dL — ABNORMAL LOW (ref >40)
LDL Cholesterol: 132 mg/dL — ABNORMAL HIGH (ref <100)
Total CHOL/HDL Ratio: 5.2 Ratio — ABNORMAL HIGH (ref <5.0)
Triglycerides: 158 mg/dL — ABNORMAL HIGH (ref <150)
VLDL: 32 mg/dL — ABNORMAL HIGH (ref <30)

## 2016-11-10 LAB — POCT GLYCOSYLATED HEMOGLOBIN (HGB A1C): Hemoglobin A1C: 5.4

## 2016-11-10 LAB — GLUCOSE, POCT (MANUAL RESULT ENTRY): POC Glucose: 131 mg/dl — AB (ref 70–99)

## 2016-11-10 MED ORDER — ROSUVASTATIN CALCIUM 10 MG PO TABS: 10.0000 mg | ORAL_TABLET | Freq: Every day | ORAL | 0 refills | Status: DC

## 2016-11-10 MED ORDER — ALPRAZOLAM 0.5 MG PO TABS: ORAL_TABLET | ORAL | 2 refills | Status: DC

## 2016-11-10 NOTE — Progress Notes (Signed)
Name: Daniel Snyder   MRN: [id number]    DOB: 1955/04/30   Date:11/10/2016       Progress Note  Subjective  Chief Complaint  Chief Complaint  Patient presents with  . Follow-up    3 mo  . Medication Refill    Diabetes  He presents for his follow-up diabetic visit. He has type 2 diabetes mellitus. His disease course has been stable. Hypoglycemia symptoms include nervousness/anxiousness. Pertinent negatives for diabetes include no chest pain, no fatigue, no foot paresthesias, no polydipsia and no polyuria. His weight is stable. He is following a diabetic and generally healthy diet. He monitors blood glucose at home 1-2 x per week. His lunch blood glucose range is generally 140-180 mg/dl. An ACE inhibitor/angiotensin II receptor blocker is being taken.  Hyperlipidemia  This is a chronic problem. The problem is uncontrolled. Recent lipid tests were reviewed and are high. Pertinent negatives include no chest pain, leg pain, myalgias or shortness of breath. Current antihyperlipidemic treatment includes statins.  Anxiety  Presents for follow-up visit. Symptoms include excessive worry, insomnia and nervous/anxious behavior. Patient reports no chest pain, depressed mood or shortness of breath. The severity of symptoms is moderate.      Past Medical History:  Diagnosis Date  . Anxiety   . Arthritis    fingers    Past Surgical History:  Procedure Laterality Date  . COLONOSCOPY N/A 10/10/2014   Procedure: COLONOSCOPY;  Surgeon: [name], MD;  Location: [facility];  Service: Gastroenterology;  Laterality: N/A;  PT WANTS EARLY  . POLYPECTOMY  10/10/2014   Procedure: POLYPECTOMY;  Surgeon: [name], MD;  Location: [facility];  Service: Gastroenterology;;    History reviewed. No pertinent family history.  Social History   Social History  . Marital status: Married    Spouse name: N/A  . Number of children: N/A  . Years of education: N/A   Occupational History  .  Not on file.   Social History Main Topics  . Smoking status: Current Every Day Smoker    Packs/day: 0.50    Years: 20.00  . Smokeless tobacco: Never Used  . Alcohol use 2.4 oz/week    4 Cans of beer per week  . Drug use: Unknown  . Sexual activity: Not on file   Other Topics Concern  . Not on file   Social History Narrative  . No narrative on file     Current Outpatient Prescriptions:  .  albuterol (PROVENTIL HFA;VENTOLIN HFA) 108 (90 Base) MCG/ACT inhaler, Inhale 2 puffs into the lungs every 6 (six) hours as needed for wheezing or shortness of breath., Disp: 1 Inhaler, Rfl: 0 .  ALPRAZolam (XANAX) 0.5 MG tablet, TAKE 1 TABLET BY MOUTH TWICE A DAY AS NEEDED FOR ANXIETY, Disp: 60 tablet, Rfl: 2 .  lisinopril (PRINIVIL,ZESTRIL) 5 MG tablet, Take 1 tablet (5 mg total) by mouth daily., Disp: 90 tablet, Rfl: 0 .  meloxicam (MOBIC) 15 MG tablet, , Disp: , Rfl: 1 .  metFORMIN (GLUCOPHAGE) 500 MG tablet, Take 1 tablet (500 mg total) by mouth 2 (two) times daily with a meal., Disp: 180 tablet, Rfl: 0 .  rosuvastatin (CRESTOR) 10 MG tablet, TAKE 1 TABLET (10 MG TOTAL) BY MOUTH AT BEDTIME., Disp: 30 tablet, Rfl: 0 .  azithromycin (ZITHROMAX) 250 MG tablet, Take 2 pills on day 1, Then 1 pill daily on days 2-5. (Patient not taking: Reported on 11/10/2016), Disp: 6 tablet, Rfl: 0 .  benzonatate (TESSALON) 100 MG  capsule, Take 1 capsule (100 mg total) by mouth 3 (three) times daily as needed for cough. (Patient not taking: Reported on 11/10/2016), Disp: 20 capsule, Rfl: 0 .  chlorpheniramine-HYDROcodone (TUSSIONEX PENNKINETIC ER) 10-8 MG/5ML SUER, Take 5 mLs by mouth at bedtime. (Patient not taking: Reported on 11/10/2016), Disp: 50 mL, Rfl: 0 .  Vitamin D, Ergocalciferol, (DRISDOL) 50000 units CAPS capsule, Take 1 capsule (50,000 Units total) by mouth once a week. For 12 weeks (Patient not taking: Reported on 11/10/2016), Disp: 12 capsule, Rfl: 0  No Known Allergies   Review of Systems   Constitutional: Negative for fatigue.  Respiratory: Negative for shortness of breath.   Cardiovascular: Negative for chest pain.  Musculoskeletal: Negative for myalgias.  Endo/Heme/Allergies: Negative for polydipsia.  Psychiatric/Behavioral: The patient is nervous/anxious and has insomnia.      Objective  Vitals:   11/10/16 0838  BP: 120/74  Pulse: 88  Resp: 17  Temp: 97.6 F (36.4 C)  TempSrc: Oral  SpO2: 96%  Weight: 244 lb 4.8 oz (110.8 kg)  Height: 5\' 9"  (1.753 m)    Physical Exam  Constitutional: He is oriented to person, place, and time and well-developed, well-nourished, and in no distress.  HENT:  Head: Normocephalic and atraumatic.  Cardiovascular: Normal rate, regular rhythm and normal heart sounds.   No murmur heard. Pulmonary/Chest: Effort normal and breath sounds normal. He has no wheezes. He has no rhonchi.  Abdominal: Soft. Bowel sounds are normal. There is no tenderness. There is no rebound.  Neurological: He is alert and oriented to person, place, and time.  Psychiatric: Mood, memory, affect and judgment normal.  Nursing note and vitals reviewed.      Recent Results (from the past 2160 hour(s))  POCT HgB A1C     Status: Normal   Collection Time: 11/10/16  8:47 AM  Result Value Ref Range   Hemoglobin A1C 5.4   POCT Glucose (CBG)     Status: Abnormal   Collection Time: 11/10/16  8:47 AM  Result Value Ref Range   POC Glucose 131 (A) 70 - 99 mg/dl     Assessment & Plan  1. Type 2 diabetes mellitus without complication, without long-term current use of insulin (HCC) Well-controlled diabetes with A1c of 5.4%, no change in pharmacotherapy for now - POCT HgB A1C - POCT Glucose (CBG)  2. Anxiety Stable and responsive to alprazolam taken twice daily as needed, patient compliant with controlled substances agreement. Refills provided - ALPRAZolam (XANAX) 0.5 MG tablet; TAKE 1 TABLET BY MOUTH TWICE A DAY AS NEEDED FOR ANXIETY  Dispense: 60 tablet;  Refill: 2  3. Mixed hyperlipidemia Repeat FLP continue on statin - rosuvastatin (CRESTOR) 10 MG tablet; Take 1 tablet (10 mg total) by mouth at bedtime.  Dispense: 90 tablet; Refill: 0 - Lipid panel   Mileidy Atkin Asad A. Faylene KurtzShah [facility] 11/10/2016 8:56 AM

## 2016-11-12 ENCOUNTER — Telehealth: Payer: Self-pay

## 2016-11-12 MED ORDER — ROSUVASTATIN CALCIUM 20 MG PO TABS: 20.0000 mg | ORAL_TABLET | Freq: Every day | ORAL | 0 refills | Status: DC

## 2016-11-12 NOTE — Telephone Encounter (Signed)
Patient has been notified of lab results and a there has been a change in therapy his rosuvastatin has been increased from 10 mg to 20 mg and a prescription for rosuvastatin 20 mg at bedtime has been sent to [facility] [address] per Dr. Sherryll BurgerShah, patient has been notified

## 2016-11-14 ENCOUNTER — Other Ambulatory Visit: Payer: Self-pay | Admitting: Family Medicine

## 2017-02-04 ENCOUNTER — Other Ambulatory Visit: Payer: Self-pay | Admitting: Family Medicine

## 2017-02-04 DIAGNOSIS — IMO0001 Reserved for inherently not codable concepts without codable children: Secondary | ICD-10-CM

## 2017-02-04 DIAGNOSIS — E1165 Type 2 diabetes mellitus with hyperglycemia: Principal | ICD-10-CM

## 2017-02-07 ENCOUNTER — Other Ambulatory Visit: Payer: Self-pay | Admitting: Family Medicine

## 2017-02-11 ENCOUNTER — Ambulatory Visit (INDEPENDENT_AMBULATORY_CARE_PROVIDER_SITE_OTHER): Payer: BLUE CROSS/BLUE SHIELD | Admitting: Family Medicine

## 2017-02-11 ENCOUNTER — Encounter: Payer: Self-pay | Admitting: Family Medicine

## 2017-02-11 VITALS — BP 122/68 | HR 93 | Temp 97.6°F | Resp 16 | Ht 69.0 in | Wt 252.0 lb

## 2017-02-11 DIAGNOSIS — F419 Anxiety disorder, unspecified: Secondary | ICD-10-CM

## 2017-02-11 DIAGNOSIS — E782 Mixed hyperlipidemia: Secondary | ICD-10-CM | POA: Diagnosis not present

## 2017-02-11 DIAGNOSIS — E1165 Type 2 diabetes mellitus with hyperglycemia: Secondary | ICD-10-CM

## 2017-02-11 LAB — POCT GLYCOSYLATED HEMOGLOBIN (HGB A1C): Hemoglobin A1C: 7.4

## 2017-02-11 LAB — GLUCOSE, POCT (MANUAL RESULT ENTRY): POC Glucose: 252 mg/dl — AB (ref 70–99)

## 2017-02-11 MED ORDER — ALPRAZOLAM 0.5 MG PO TABS: ORAL_TABLET | ORAL | 2 refills | Status: DC

## 2017-02-11 MED ORDER — METFORMIN HCL 500 MG PO TABS: 1000.0000 mg | ORAL_TABLET | Freq: Two times a day (BID) | ORAL | 0 refills | Status: DC

## 2017-02-11 NOTE — Progress Notes (Signed)
The following letter was provided to Daniel Snyder during their visit today: ---------------------------------------  Dear valued [facility] Patient,  I am writing to share that as of June 10, 2017, I will no longer be seeing patients at [facility]. While it has been my privilege to care for you as a physician, I have decided to [place] outside [place]  to pursue other opportunities.  The staff at [facility] has been supportive of my decision and are supportive of any patients who have been under my care.  They will be happy to provide care to you and your family.  The office staff will do everything they can to ensure a seamless transition of care at [facility].  However if you are on any controlled substance medications (i.e. Pain medication or benzodiazepines), they will no longer be able to refill those medications, but we will be more than happy to refer you to a specialist.  [facility] will also assist you with the transfer of medical records should you wish to seek care elsewhere.  If you have any questions about your future care, you may call the office at [phone].  I have enjoyed getting to know my patients here and I wish you the very best.  Sincerely,  Velta AddisonSyed Asad [name], MD  ---------------------------------------  A written copy of this letter was given to the patient.  The patient verbalizes understanding of the letter, and does not know request referral to specialist or to new primary care provider.  This decision has been conveyed to Dr. Sherryll BurgerShah, who will place any appropriate referrals during today's visit.

## 2017-02-11 NOTE — Progress Notes (Signed)
Name: Daniel Snyder   MRN: [id number]    DOB: 1955/10/17   Date:02/11/2017       Progress Note  Subjective  Chief Complaint  Chief Complaint  Patient presents with  . Follow-up  . Medication Refill    xanxa, metformin and strips    Diabetes  He presents for his follow-up diabetic visit. He has type 2 diabetes mellitus. His disease course has been stable. Hypoglycemia symptoms include nervousness/anxiousness. Pertinent negatives for diabetes include no chest pain, no fatigue, no foot paresthesias, no polydipsia and no polyuria. (Sometimes his feet get cold, no numbness) Pertinent negatives for diabetic complications include no CVA or peripheral neuropathy. Current diabetic treatment includes oral agent (monotherapy). His weight is stable. He is following a diabetic and generally healthy diet. He monitors blood glucose at home 1-2 x per week. His dinner blood glucose range is generally 140-180 mg/dl. An ACE inhibitor/angiotensin II receptor blocker is being taken.  Hyperlipidemia  This is a chronic problem. The problem is uncontrolled. Recent lipid tests were reviewed and are high. Pertinent negatives include no chest pain, leg pain, myalgias or shortness of breath. Current antihyperlipidemic treatment includes statins.  Anxiety  Presents for follow-up visit. Symptoms include excessive worry, insomnia and nervous/anxious behavior. Patient reports no chest pain, depressed mood or shortness of breath. The severity of symptoms is moderate.      Past Medical History:  Diagnosis Date  . Anxiety   . Arthritis    fingers    Past Surgical History:  Procedure Laterality Date  . COLONOSCOPY N/A 10/10/2014   Procedure: COLONOSCOPY;  Surgeon: Midge Miniumarren Wohl, MD;  [facility];  Service: Gastroenterology;  Laterality: N/A;  PT WANTS EARLY  . POLYPECTOMY  10/10/2014   Procedure: POLYPECTOMY;  Surgeon: Midge Miniumarren Wohl, MD;  [facility];  Service: Gastroenterology;;     History reviewed. No pertinent family history.  Social History   Social History  . Marital status: Married    Spouse name: N/A  . Number of children: N/A  . Years of education: N/A   Occupational History  . Not on file.   Social History Main Topics  . Smoking status: Current Every Day Smoker    Packs/day: 0.50    Years: 20.00  . Smokeless tobacco: Never Used  . Alcohol use 2.4 oz/week    4 Cans of beer per week  . Drug use: No  . Sexual activity: No   Other Topics Concern  . Not on file   Social History Narrative  . No narrative on file     Current Outpatient Prescriptions:  .  albuterol (PROVENTIL HFA;VENTOLIN HFA) 108 (90 Base) MCG/ACT inhaler, Inhale 2 puffs into the lungs every 6 (six) hours as needed for wheezing or shortness of breath., Disp: 1 Inhaler, Rfl: 0 .  ALPRAZolam (XANAX) 0.5 MG tablet, TAKE 1 TABLET BY MOUTH TWICE A DAY AS NEEDED FOR ANXIETY, Disp: 60 tablet, Rfl: 2 .  lisinopril (PRINIVIL,ZESTRIL) 5 MG tablet, TAKE 1 TABLET BY MOUTH EVERY DAY, Disp: 90 tablet, Rfl: 0 .  meloxicam (MOBIC) 15 MG tablet, , Disp: , Rfl: 1 .  metFORMIN (GLUCOPHAGE) 500 MG tablet, Take 1 tablet (500 mg total) by mouth 2 (two) times daily with a meal., Disp: 180 tablet, Rfl: 0 .  rosuvastatin (CRESTOR) 20 MG tablet, TAKE 1 TABLET BY MOUTH EVERYDAY AT BEDTIME, Disp: 90 tablet, Rfl: 0 .  metFORMIN (GLUCOPHAGE) 500 MG tablet, TAKE 1 TABLET (500 MG TOTAL) BY MOUTH 2 (  TWO) TIMES DAILY WITH A MEAL. (Patient not taking: Reported on 02/11/2017), Disp: 180 tablet, Rfl: 0 .  Vitamin D, Ergocalciferol, (DRISDOL) 50000 units CAPS capsule, Take 1 capsule (50,000 Units total) by mouth once a week. For 12 weeks (Patient not taking: Reported on 11/10/2016), Disp: 12 capsule, Rfl: 0  No Known Allergies   Review of Systems  Constitutional: Negative for fatigue.  Respiratory: Negative for shortness of breath.   Cardiovascular: Negative for chest pain.  Musculoskeletal: Negative for  myalgias.  Endo/Heme/Allergies: Negative for polydipsia.  Psychiatric/Behavioral: The patient is nervous/anxious and has insomnia.       Objective  Vitals:   02/11/17 0854  BP: 122/68  Pulse: 93  Resp: 16  Temp: 97.6 F (36.4 C)  TempSrc: Oral  SpO2: 96%  Weight: 252 lb (114.3 kg)  Height: 5\' 9"  (1.753 m)    Physical Exam  Constitutional: He is oriented to person, place, and time and well-developed, well-nourished, and in no distress.  HENT:  Head: Normocephalic and atraumatic.  Cardiovascular: Normal rate, regular rhythm and normal heart sounds.   No murmur heard. Pulmonary/Chest: Effort normal and breath sounds normal. He has no wheezes. He has no rhonchi.  Abdominal: Soft. Bowel sounds are normal. There is no tenderness. There is no rebound.  Musculoskeletal: He exhibits no edema.  Neurological: He is alert and oriented to person, place, and time.  Psychiatric: Mood, memory, affect and judgment normal.  Nursing note and vitals reviewed.   Recent Results (from the past 2160 hour(s))  POCT HgB A1C     Status: Abnormal   Collection Time: 02/11/17  9:01 AM  Result Value Ref Range   Hemoglobin A1C 7.4   POCT Glucose (CBG)     Status: Abnormal   Collection Time: 02/11/17  9:01 AM  Result Value Ref Range   POC Glucose 252 (A) 70 - 99 mg/dl     Assessment & Plan  1. Uncontrolled type 2 diabetes mellitus with hyperglycemia (HCC) Point-of-care A1c 7.4%, poorly controlled diabetes. We'll increase metformin to 1000 mg twice a day, advise and dietary lifestyle change - POCT HgB A1C - POCT Glucose (CBG) - metFORMIN (GLUCOPHAGE) 500 MG tablet; Take 2 tablets (1,000 mg total) by mouth 2 (two) times daily with a meal.  Dispense: 180 tablet; Refill: 0  2. Anxiety Symptoms of anxiety are stable on alprazolam taken twice daily as needed, explained that after my departure from the practice, he will need to be referred to a psychiatrist for continued management of anxiety and he  verbalized agreement. - ALPRAZolam (XANAX) 0.5 MG tablet; TAKE 1 TABLET BY MOUTH TWICE A DAY AS NEEDED FOR ANXIETY  Dispense: 60 tablet; Refill: 2  3. Mixed hyperlipidemia Obtain FLP and adjust statin as appropriate - Lipid panel    Porsche Noguchi Asad A. Faylene Kurtz [facility] 02/11/2017 9:09 AM

## 2017-02-18 DIAGNOSIS — E782 Mixed hyperlipidemia: Secondary | ICD-10-CM | POA: Diagnosis not present

## 2017-02-18 LAB — LIPID PANEL
Cholesterol: 123 mg/dL (ref <200)
HDL: 35 mg/dL — ABNORMAL LOW (ref >40)
LDL Cholesterol (Calc): 65 mg/dL (calc)
Non-HDL Cholesterol (Calc): 88 mg/dL (calc) (ref <130)
Total CHOL/HDL Ratio: 3.5 (calc) (ref <5.0)
Triglycerides: 147 mg/dL (ref <150)

## 2017-02-22 ENCOUNTER — Other Ambulatory Visit: Payer: Self-pay

## 2017-02-22 DIAGNOSIS — E1165 Type 2 diabetes mellitus with hyperglycemia: Secondary | ICD-10-CM

## 2017-02-22 MED ORDER — GLUCOSE BLOOD VI STRP: ORAL_STRIP | 0 refills | Status: DC

## 2017-02-22 NOTE — Telephone Encounter (Signed)
Pt states he was giving a sample meter kit from you and now he need a refill on the strips. It's been added on the med list.

## 2017-04-21 ENCOUNTER — Encounter: Payer: Self-pay | Admitting: Family Medicine

## 2017-04-21 ENCOUNTER — Ambulatory Visit: Payer: BLUE CROSS/BLUE SHIELD | Admitting: Family Medicine

## 2017-04-21 VITALS — BP 128/58 | HR 93 | Temp 98.3°F | Resp 18 | Wt 238.6 lb

## 2017-04-21 DIAGNOSIS — J0100 Acute maxillary sinusitis, unspecified: Secondary | ICD-10-CM | POA: Diagnosis not present

## 2017-04-21 DIAGNOSIS — R05 Cough: Secondary | ICD-10-CM

## 2017-04-21 DIAGNOSIS — R059 Cough, unspecified: Secondary | ICD-10-CM

## 2017-04-21 MED ORDER — GUAIFENESIN-CODEINE 100-10 MG/5ML PO SYRP: 10.0000 mL | ORAL_SOLUTION | Freq: Three times a day (TID) | ORAL | 0 refills | Status: DC | PRN

## 2017-04-21 MED ORDER — FLUTICASONE PROPIONATE 50 MCG/ACT NA SUSP: 2.0000 | Freq: Every day | NASAL | 0 refills | Status: DC

## 2017-04-21 MED ORDER — AZITHROMYCIN 250 MG PO TABS: ORAL_TABLET | ORAL | 0 refills | Status: DC

## 2017-04-21 NOTE — Progress Notes (Signed)
Name: Daniel Snyder   MRN: [id number]    DOB: [birth year]   Date:04/21/2017       Progress Note  Subjective  Chief Complaint  Chief Complaint  Patient presents with  . Cough    for about a month   . head pressure    for about a month   . hot and cold chills    off and on for three weeks  . Diarrhea    off and non for 2 weeks, last spell was before coming here pt states he is eating the same   . Fatigue    no energy at times, sweating at night; pt denies any fever he stated he didnt feel warm but temp was never tooken, and sore throat  . Mouth Lesions    Cough  This is a new problem. The current episode started more than 1 month ago. The problem has been unchanged. The cough is productive of sputum. Associated symptoms include chills and nasal congestion. Pertinent negatives include no ear congestion, ear pain, fever or headaches. The symptoms are aggravated by cold air. He has tried OTC cough suppressant (Mucinex extra strength) for the symptoms.     Past Medical History:  Diagnosis Date  . Anxiety   . Arthritis    fingers    Past Surgical History:  Procedure Laterality Date  . COLONOSCOPY N/A 10/10/2014   Procedure: COLONOSCOPY;  Surgeon: Midge Miniumarren Wohl, MD;  [facility];  Service: Gastroenterology;  Laterality: N/A;  PT WANTS EARLY  . POLYPECTOMY  10/10/2014   Procedure: POLYPECTOMY;  Surgeon: Midge Miniumarren Wohl, MD;  [facility];  Service: Gastroenterology;;    No family history on file.  Social History   Socioeconomic History  . Marital status: Married    Spouse name: Not on file  . Number of children: Not on file  . Years of education: Not on file  . Highest education level: Not on file  Social Needs  . Financial resource strain: Not on file  . Food insecurity - worry: Not on file  . Food insecurity - inability: Not on file  . Transportation needs - medical: Not on file  . Transportation needs - non-medical: Not on file   Occupational History  . Not on file  Tobacco Use  . Smoking status: Current Every Day Smoker    Packs/day: 0.50    Years: 20.00    Pack years: 10.00  . Smokeless tobacco: Never Used  Substance and Sexual Activity  . Alcohol use: Yes    Alcohol/week: 2.4 oz    Types: 4 Cans of beer per week  . Drug use: No  . Sexual activity: No  Other Topics Concern  . Not on file  Social History Narrative  . Not on file     Current Outpatient Medications:  .  albuterol (PROVENTIL HFA;VENTOLIN HFA) 108 (90 Base) MCG/ACT inhaler, Inhale 2 puffs into the lungs every 6 (six) hours as needed for wheezing or shortness of breath., Disp: 1 Inhaler, Rfl: 0 .  ALPRAZolam (XANAX) 0.5 MG tablet, TAKE 1 TABLET BY MOUTH TWICE A DAY AS NEEDED FOR ANXIETY, Disp: 60 tablet, Rfl: 2 .  glucose blood test strip, Use as instructed to check blood glucose once daily, Disp: 100 each, Rfl: 0 .  lisinopril (PRINIVIL,ZESTRIL) 5 MG tablet, TAKE 1 TABLET BY MOUTH EVERY DAY, Disp: 90 tablet, Rfl: 0 .  meloxicam (MOBIC) 15 MG tablet, , Disp: , Rfl: 1 .  metFORMIN (GLUCOPHAGE)  500 MG tablet, Take 2 tablets (1,000 mg total) by mouth 2 (two) times daily with a meal., Disp: 180 tablet, Rfl: 0 .  rosuvastatin (CRESTOR) 20 MG tablet, TAKE 1 TABLET BY MOUTH EVERYDAY AT BEDTIME, Disp: 90 tablet, Rfl: 0 .  metFORMIN (GLUCOPHAGE) 500 MG tablet, TAKE 1 TABLET (500 MG TOTAL) BY MOUTH 2 (TWO) TIMES DAILY WITH A MEAL. (Patient not taking: Reported on 04/21/2017), Disp: 180 tablet, Rfl: 0 .  Vitamin D, Ergocalciferol, (DRISDOL) 50000 units CAPS capsule, Take 1 capsule (50,000 Units total) by mouth once a week. For 12 weeks (Patient not taking: Reported on 11/10/2016), Disp: 12 capsule, Rfl: 0  No Known Allergies   Review of Systems  Constitutional: Positive for chills. Negative for fever.  HENT: Negative for ear pain.   Respiratory: Positive for cough.   Neurological: Negative for headaches.     Objective  Vitals:   04/21/17 1140   BP: (!) 128/58  Pulse: 93  Resp: 18  Temp: 98.3 F (36.8 C)  TempSrc: Oral  SpO2: 97%  Weight: 238 lb 9.6 oz (108.2 kg)    Physical Exam  Constitutional: He is oriented to person, place, and time and well-developed, well-nourished, and in no distress.  HENT:  Head: Normocephalic and atraumatic.  Nose: Right sinus exhibits maxillary sinus tenderness. Left sinus exhibits maxillary sinus tenderness.  Mouth/Throat: No posterior oropharyngeal edema or posterior oropharyngeal erythema.  Left nasal mucosal inflammation, turbinates hypertrophied.  Cardiovascular: Normal rate, regular rhythm and normal heart sounds.  No murmur heard. Pulmonary/Chest: Effort normal and breath sounds normal. No respiratory distress. He has no decreased breath sounds. He has no wheezes. He has no rhonchi.  Neurological: He is alert and oriented to person, place, and time.  Nursing note and vitals reviewed.      Recent Results (from the past 2160 hour(s))  POCT HgB A1C     Status: Abnormal   Collection Time: 02/11/17  9:01 AM  Result Value Ref Range   Hemoglobin A1C 7.4   POCT Glucose (CBG)     Status: Abnormal   Collection Time: 02/11/17  9:01 AM  Result Value Ref Range   POC Glucose 252 (A) 70 - 99 mg/dl  Lipid panel     Status: Abnormal   Collection Time: 02/18/17  8:10 AM  Result Value Ref Range   Cholesterol 123 <200 mg/dL   HDL 35 (L) >16 mg/dL   Triglycerides 109 <604 mg/dL   LDL Cholesterol (Calc) 65 mg/dL (calc)    Comment: Reference range: <100 . Desirable range <100 mg/dL for primary prevention;   <70 mg/dL for patients with CHD or diabetic patients  with > or = 2 CHD risk factors. [name] LDL-C is now calculated using the Martin-Hopkins  calculation, which is a validated novel method providing  better accuracy than the Friedewald equation in the  estimation of LDL-C.  Horald Pollen et al. Lenox Ahr. 5409;811(91): 2061-2068  (http://education.[employer].com/faq/FAQ164)    Total CHOL/HDL  Ratio 3.5 <5.0 (calc)   Non-HDL Cholesterol (Calc) 88 <478 mg/dL (calc)    Comment: For patients with diabetes plus 1 major ASCVD risk  factor, treating to a non-HDL-C goal of <100 mg/dL  (LDL-C of <29 mg/dL) is considered a therapeutic  option.      Assessment & Plan  1. Subacute maxillary sinusitis Suspect sinusitis and postnasal drainage causing symptoms of coughing, start on azithromycin and Flonase given the subacute nature, reassess if no clinical improvement - azithromycin (ZITHROMAX) 250 MG tablet; 2 tabs po  day 1, then 1 tab po q day x 4 days  Dispense: 6 tablet; Refill: 0 - fluticasone (FLONASE) 50 MCG/ACT nasal spray; Place 2 sprays into both nostrils daily.  Dispense: 16 g; Refill: 0  2. Cough As above, will start on Cheratussin, obtain chest x-ray if no clinical improvement - guaiFENesin-codeine (CHERATUSSIN AC) 100-10 MG/5ML syrup; Take 10 mLs by mouth 3 (three) times daily as needed for cough.  Dispense: 150 mL; Refill: 0   Daniel Snyder A. Faylene KurtzShah [facility] 04/21/2017 11:56 AM

## 2017-05-13 ENCOUNTER — Other Ambulatory Visit: Payer: Self-pay

## 2017-05-13 DIAGNOSIS — J0100 Acute maxillary sinusitis, unspecified: Secondary | ICD-10-CM

## 2017-05-16 ENCOUNTER — Ambulatory Visit: Payer: BLUE CROSS/BLUE SHIELD | Admitting: Family Medicine

## 2017-05-17 ENCOUNTER — Other Ambulatory Visit: Payer: Self-pay | Admitting: Family Medicine

## 2017-05-17 ENCOUNTER — Ambulatory Visit: Payer: BLUE CROSS/BLUE SHIELD | Admitting: Family Medicine

## 2017-05-17 DIAGNOSIS — E1165 Type 2 diabetes mellitus with hyperglycemia: Secondary | ICD-10-CM

## 2017-05-18 ENCOUNTER — Other Ambulatory Visit: Payer: Self-pay | Admitting: Family Medicine

## 2017-05-18 DIAGNOSIS — J0100 Acute maxillary sinusitis, unspecified: Secondary | ICD-10-CM

## 2017-05-23 ENCOUNTER — Other Ambulatory Visit: Payer: Self-pay | Admitting: Family Medicine

## 2017-05-23 DIAGNOSIS — E1165 Type 2 diabetes mellitus with hyperglycemia: Secondary | ICD-10-CM

## 2017-05-24 ENCOUNTER — Encounter: Payer: Self-pay | Admitting: Family Medicine

## 2017-05-24 ENCOUNTER — Ambulatory Visit: Payer: BLUE CROSS/BLUE SHIELD | Admitting: Family Medicine

## 2017-05-24 VITALS — BP 112/56 | HR 87 | Temp 98.6°F | Resp 18 | Wt 238.5 lb

## 2017-05-24 DIAGNOSIS — E7800 Pure hypercholesterolemia, unspecified: Secondary | ICD-10-CM | POA: Diagnosis not present

## 2017-05-24 DIAGNOSIS — R05 Cough: Secondary | ICD-10-CM | POA: Diagnosis not present

## 2017-05-24 DIAGNOSIS — E119 Type 2 diabetes mellitus without complications: Secondary | ICD-10-CM

## 2017-05-24 DIAGNOSIS — F419 Anxiety disorder, unspecified: Secondary | ICD-10-CM | POA: Diagnosis not present

## 2017-05-24 DIAGNOSIS — R059 Cough, unspecified: Secondary | ICD-10-CM

## 2017-05-24 LAB — POCT GLYCOSYLATED HEMOGLOBIN (HGB A1C): Hemoglobin A1C: 5.8

## 2017-05-24 MED ORDER — ALPRAZOLAM 0.5 MG PO TABS: ORAL_TABLET | ORAL | 2 refills | Status: DC

## 2017-05-24 MED ORDER — METFORMIN HCL 500 MG PO TABS: 500.0000 mg | ORAL_TABLET | Freq: Two times a day (BID) | ORAL | 0 refills | Status: DC

## 2017-05-24 MED ORDER — HYDROCOD POLST-CPM POLST ER 10-8 MG/5ML PO SUER
5.0000 mL | Freq: Two times a day (BID) | ORAL | 0 refills | Status: AC | PRN
Start: 2017-05-24 — End: 2017-05-29

## 2017-05-24 MED ORDER — ROSUVASTATIN CALCIUM 20 MG PO TABS: ORAL_TABLET | ORAL | 0 refills | Status: DC

## 2017-05-24 MED ORDER — LISINOPRIL 5 MG PO TABS: 5.0000 mg | ORAL_TABLET | Freq: Every day | ORAL | 0 refills | Status: DC

## 2017-05-24 NOTE — Progress Notes (Signed)
Name: Daniel Snyder   MRN: [id number]    DOB: [birth year]   Date:05/24/2017       Progress Note  Subjective  Chief Complaint  Chief Complaint  Patient presents with  . Hyperlipidemia  . Hypertension    Pt denies any issues   . Diabetes  . Medication Refill    Diabetes  He presents for his follow-up diabetic visit. He has type 2 diabetes mellitus. His disease course has been improving. There are no hypoglycemic associated symptoms. Pertinent negatives for hypoglycemia include no dizziness. Pertinent negatives for diabetes include no fatigue, no foot paresthesias, no polydipsia and no polyuria. There are no hypoglycemic complications. Symptoms are stable. Pertinent negatives for diabetic complications include no heart disease or peripheral neuropathy. Current diabetic treatment includes oral agent (monotherapy) and diet. He is following a generally healthy diet. He rarely participates in exercise. He monitors blood glucose at home 1-2 x per week. His breakfast blood glucose range is generally 110-130 mg/dl. An ACE inhibitor/angiotensin II receptor blocker is being taken. Eye exam is current.  Hyperlipidemia  This is a chronic problem. The problem is controlled. Recent lipid tests were reviewed and are normal. Pertinent negatives include no leg pain, myalgias or shortness of breath. Current antihyperlipidemic treatment includes statins.  Cough  This is a new problem. The current episode started 1 to 4 weeks ago (3 weeks ago.). The cough is productive of sputum. Pertinent negatives include no chills, fever, myalgias, sore throat or shortness of breath. He has tried prescription cough suppressant and OTC cough suppressant (Nyquil, Cheratussin did not help) for the symptoms. His past medical history is significant for bronchitis. There is no history of COPD or pneumonia.      Past Medical History:  Diagnosis Date  . Anxiety   . Arthritis    fingers    Past Surgical History:  Procedure  Laterality Date  . COLONOSCOPY N/A 10/10/2014   Procedure: COLONOSCOPY;  Surgeon: [name], MD;  Location: [facility];  Service: Gastroenterology;  Laterality: N/A;  PT WANTS EARLY  . POLYPECTOMY  10/10/2014   Procedure: POLYPECTOMY;  Surgeon: [name], MD;  Location: [facility];  Service: Gastroenterology;;    Family History  Problem Relation Age of Onset  . COPD Mother   . Cancer Father   . Emphysema Father   . Diabetes Sister   . Heart attack Brother   . Heart disease Paternal Grandfather   . Heart attack Paternal Grandfather     Social History   Socioeconomic History  . Marital status: Married    Spouse name: Not on file  . Number of children: Not on file  . Years of education: Not on file  . Highest education level: Not on file  Social Needs  . Financial resource strain: Not on file  . Food insecurity - worry: Not on file  . Food insecurity - inability: Not on file  . Transportation needs - medical: Not on file  . Transportation needs - non-medical: Not on file  Occupational History  . Not on file  Tobacco Use  . Smoking status: Current Every Day Smoker    Packs/day: 0.50    Years: 20.00    Pack years: 10.00  . Smokeless tobacco: Never Used  Substance and Sexual Activity  . Alcohol use: Yes    Alcohol/week: 2.4 oz    Types: 4 Cans of beer per week  . Drug use: No  . Sexual activity: No  Other Topics Concern  .  Not on file  Social History Narrative  . Not on file     Current Outpatient Medications:  .  albuterol (PROVENTIL HFA;VENTOLIN HFA) 108 (90 Base) MCG/ACT inhaler, Inhale 2 puffs into the lungs every 6 (six) hours as needed for wheezing or shortness of breath., Disp: 1 Inhaler, Rfl: 0 .  ALPRAZolam (XANAX) 0.5 MG tablet, TAKE 1 TABLET BY MOUTH TWICE A DAY AS NEEDED FOR ANXIETY, Disp: 60 tablet, Rfl: 2 .  CONTOUR NEXT TEST test strip, USE AS INSTRUCTED TO CHECK BLOOD GLUCOSE ONCE DAILY*VERIFY METER WITH PT, NO ANSWER  10/30*NOTCOVERED, Disp: 90 each, Rfl: 2 .  fluticasone (FLONASE) 50 MCG/ACT nasal spray, SPRAY 2 SPRAYS INTO EACH NOSTRIL EVERY DAY, Disp: 16 g, Rfl: 0 .  guaiFENesin-codeine (CHERATUSSIN AC) 100-10 MG/5ML syrup, Take 10 mLs by mouth 3 (three) times daily as needed for cough., Disp: 150 mL, Rfl: 0 .  lisinopril (PRINIVIL,ZESTRIL) 5 MG tablet, TAKE 1 TABLET BY MOUTH EVERY DAY, Disp: 90 tablet, Rfl: 0 .  meloxicam (MOBIC) 15 MG tablet, , Disp: , Rfl: 1 .  metFORMIN (GLUCOPHAGE) 500 MG tablet, TAKE 1 TABLET (500 MG TOTAL) BY MOUTH 2 (TWO) TIMES DAILY WITH A MEAL., Disp: 180 tablet, Rfl: 0 .  rosuvastatin (CRESTOR) 20 MG tablet, TAKE 1 TABLET BY MOUTH EVERYDAY AT BEDTIME, Disp: 90 tablet, Rfl: 0 .  azithromycin (ZITHROMAX) 250 MG tablet, 2 tabs po day 1, then 1 tab po q day x 4 days (Patient not taking: Reported on 05/24/2017), Disp: 6 tablet, Rfl: 0 .  metFORMIN (GLUCOPHAGE) 500 MG tablet, Take 2 tablets (1,000 mg total) by mouth 2 (two) times daily with a meal. (Patient not taking: Reported on 05/24/2017), Disp: 180 tablet, Rfl: 0 .  Vitamin D, Ergocalciferol, (DRISDOL) 50000 units CAPS capsule, Take 1 capsule (50,000 Units total) by mouth once a week. For 12 weeks (Patient not taking: Reported on 05/24/2017), Disp: 12 capsule, Rfl: 0  No Known Allergies   Review of Systems  Constitutional: Negative for chills, fatigue and fever.  HENT: Negative for sore throat.   Respiratory: Positive for cough. Negative for shortness of breath.   Musculoskeletal: Negative for myalgias.  Neurological: Negative for dizziness.  Endo/Heme/Allergies: Negative for polydipsia.    Objective  Vitals:   05/24/17 1057  BP: (!) 112/56  Pulse: 87  Resp: 18  Temp: 98.6 F (37 C)  TempSrc: Oral  SpO2: 95%  Weight: 238 lb 8 oz (108.2 kg)    Physical Exam  Constitutional: He is oriented to person, place, and time and well-developed, well-nourished, and in no distress.  HENT:  Head: Normocephalic and atraumatic.   Nose: Right sinus exhibits no maxillary sinus tenderness. Left sinus exhibits no maxillary sinus tenderness.  Mouth/Throat: Posterior oropharyngeal erythema present.  Cardiovascular: Normal rate, regular rhythm and normal heart sounds.  No murmur heard. Pulmonary/Chest: Effort normal and breath sounds normal. He has no wheezes.  Abdominal: Soft. Bowel sounds are normal. There is no tenderness.  Neurological: He is alert and oriented to person, place, and time.  Nursing note and vitals reviewed.     Recent Results (from the past 2160 hour(s))  POCT HgB A1C     Status: Normal   Collection Time: 05/24/17 11:14 AM  Result Value Ref Range   Hemoglobin A1C 5.8      Assessment & Plan  1. Controlled type 2 diabetes mellitus without complication, without long-term current use of insulin (HCC) With an A1c of 5.8%, advised patient to take metformin 1  tablet daily, continue with dietary and lifestyle changes, lisinopril for kidney protection - POCT HgB A1C - lisinopril (PRINIVIL,ZESTRIL) 5 MG tablet; Take 1 tablet (5 mg total) by mouth daily.  Dispense: 90 tablet; Refill: 0 - metFORMIN (GLUCOPHAGE) 500 MG tablet; Take 1 tablet (500 mg total) by mouth 2 (two) times daily with a meal.  Dispense: 180 tablet; Refill: 0  2. Cough Coughing likely because of drainage, not resolved with Cheratussin, start on Tussionex - chlorpheniramine-HYDROcodone (TUSSIONEX PENNKINETIC ER) 10-8 MG/5ML SUER; Take 5 mLs by mouth every 12 (twelve) hours as needed for up to 5 days for cough.  Dispense: 50 mL; Refill: 0  3. Anxiety Symptoms of anxiety are stable on alprazolam taken twice daily as needed - ALPRAZolam (XANAX) 0.5 MG tablet; TAKE 1 TABLET BY MOUTH TWICE A DAY AS NEEDED FOR ANXIETY  Dispense: 60 tablet; Refill: 2  4. Pure hypercholesterolemia  - rosuvastatin (CRESTOR) 20 MG tablet; TAKE 1 TABLET BY MOUTH EVERYDAY AT BEDTIME  Dispense: 90 tablet; Refill: 0 - Lipid panel   Jakya Dovidio Asad A.  Faylene KurtzShah [facility] 05/24/2017 11:14 AM

## 2017-06-04 IMAGING — CR DG KNEE COMPLETE 4+V*R*
1 series · 4 of 4 positions shown · non-contrast
Comparison: None.

CLINICAL DATA: Right knee pain and swelling for 2 years. No known
injury. History of arthritis.

EXAM:
RIGHT KNEE - COMPLETE 4+ VIEW

[Series 1: dg knee complete 4 views right · 0.14mm/px · 4 of 4 slices shown]
[im 1/4]
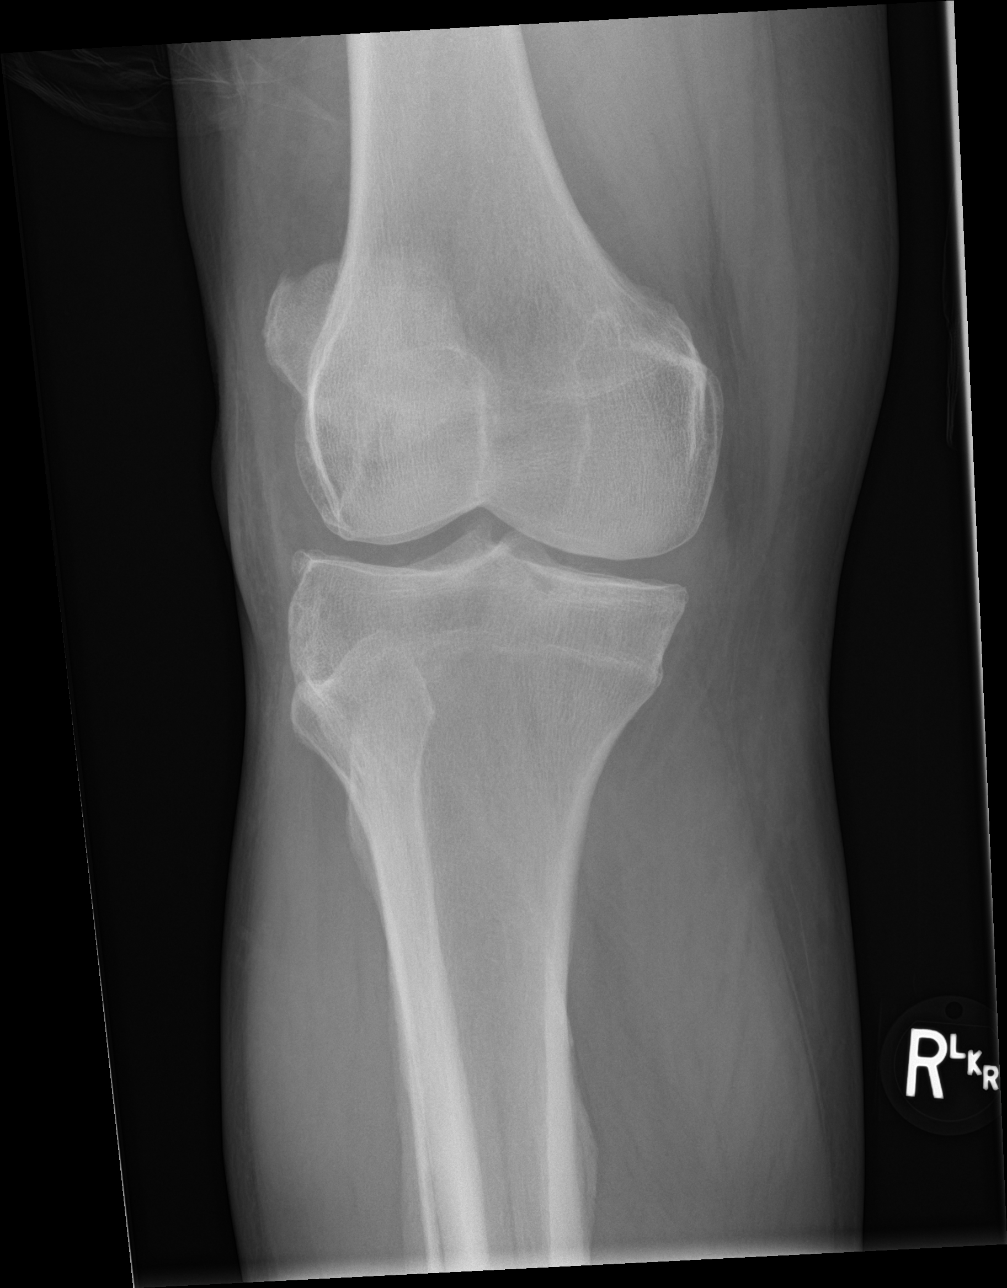
[im 2/4]
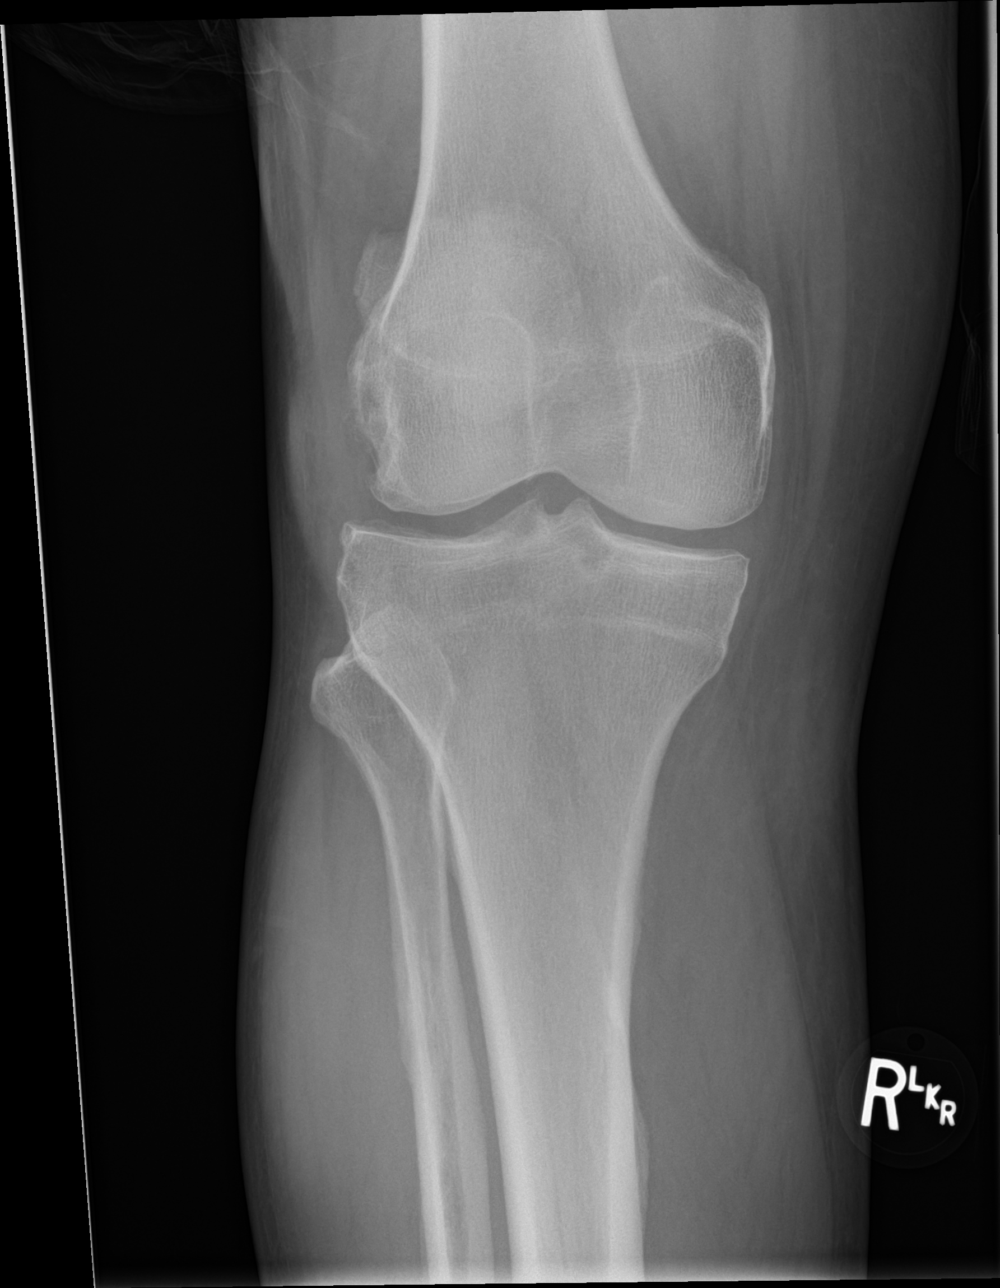
[im 3/4]
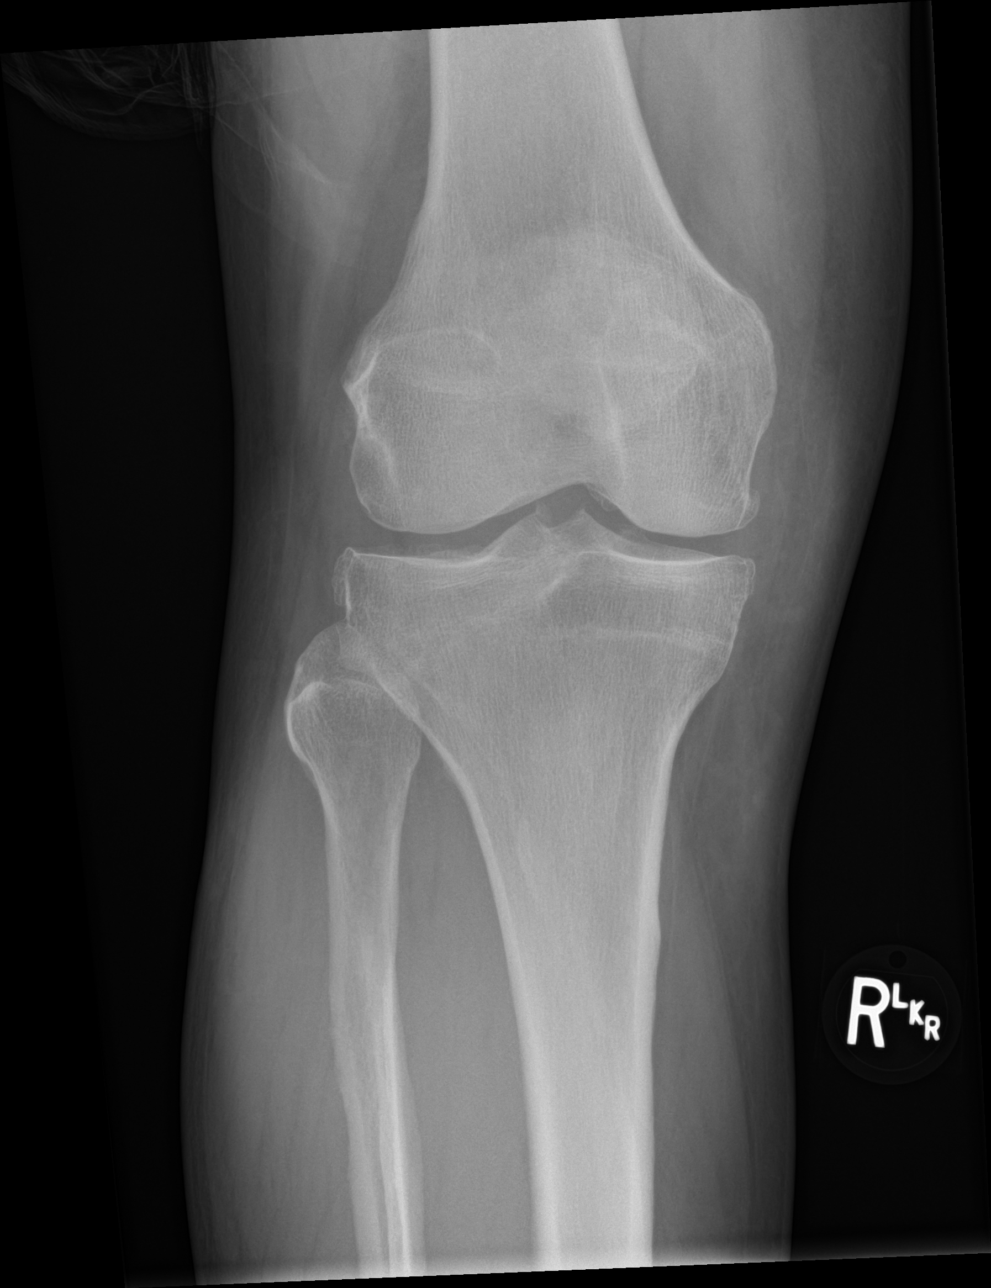
[im 4/4]
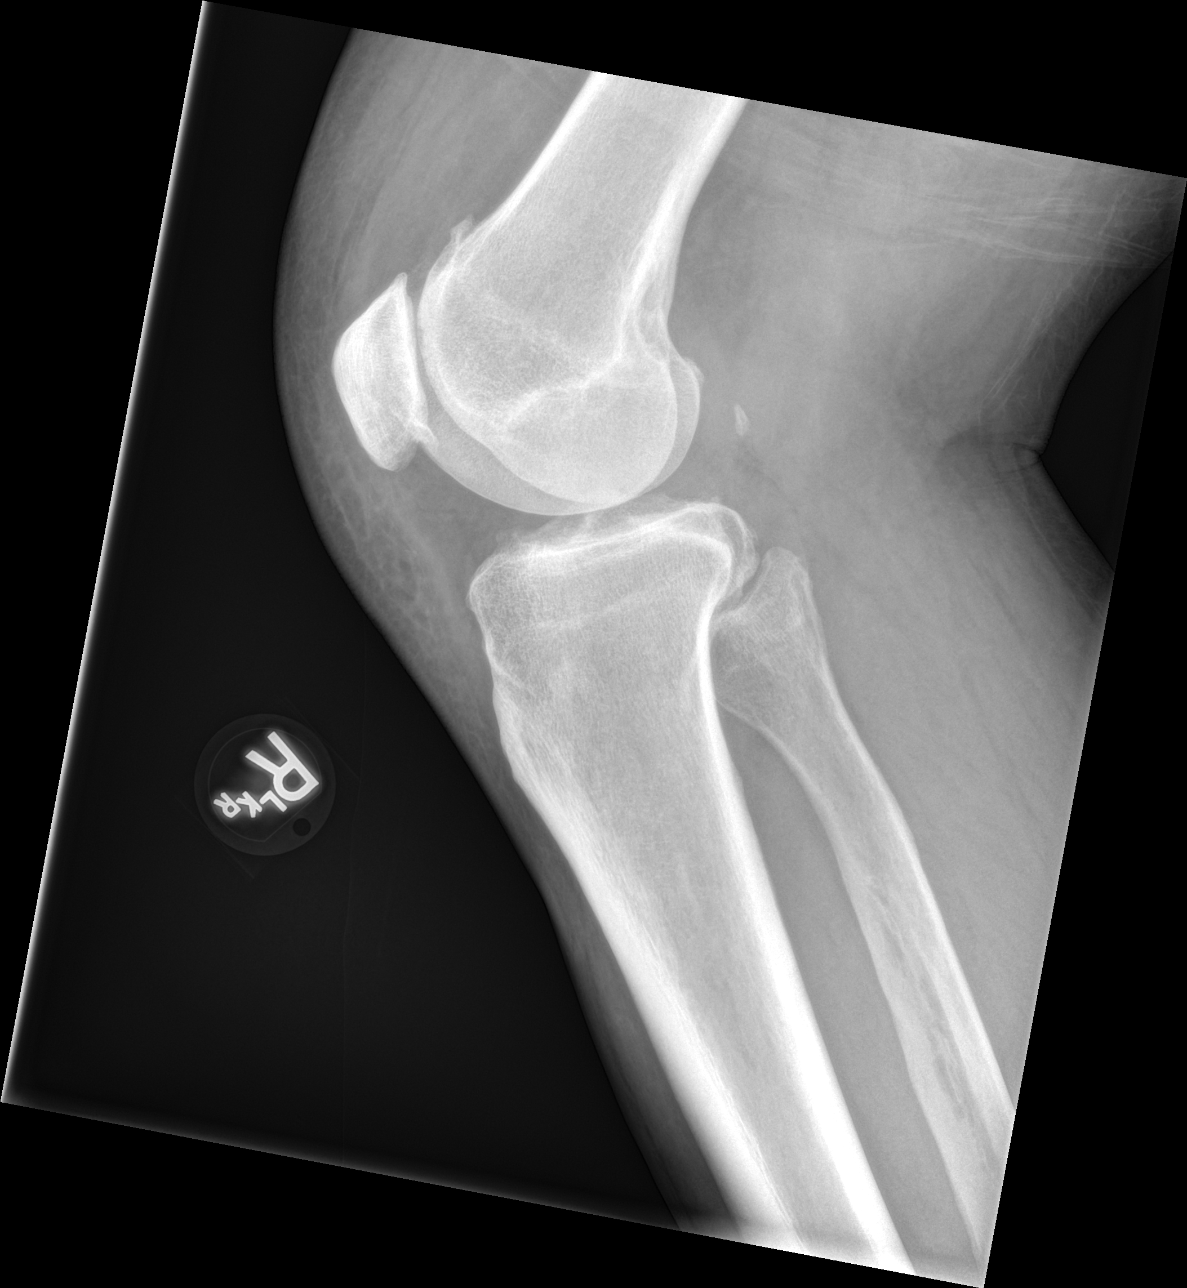

[4 of 4 positions shown; findings below may reference images not displayed]

FINDINGS: The mineralization and alignment are normal. There is no evidence of
acute fracture or dislocation. Mild tricompartmental degenerative
changes are most advanced within the patellofemoral compartment. No
significant joint effusion is seen although there is anterior
subcutaneous edema. No evidence of loose body.
IMPRESSION: Tricompartmental degenerative changes, most advanced in the
patellofemoral compartment. Anterior subcutaneous edema without
apparent significant joint effusion.

## 2017-06-04 IMAGING — CR DG ELBOW COMPLETE 3+V*R*
1 series · 4 of 4 positions shown · non-contrast
Comparison: None.

CLINICAL DATA: Elbow swelling for 4 months. No acute injury.
History of arthritis.

EXAM:
RIGHT ELBOW - COMPLETE 3+ VIEW

[Series 1: dg elbow complete right · 0.14mm/px · 4 of 4 slices shown]
[im 1/4]
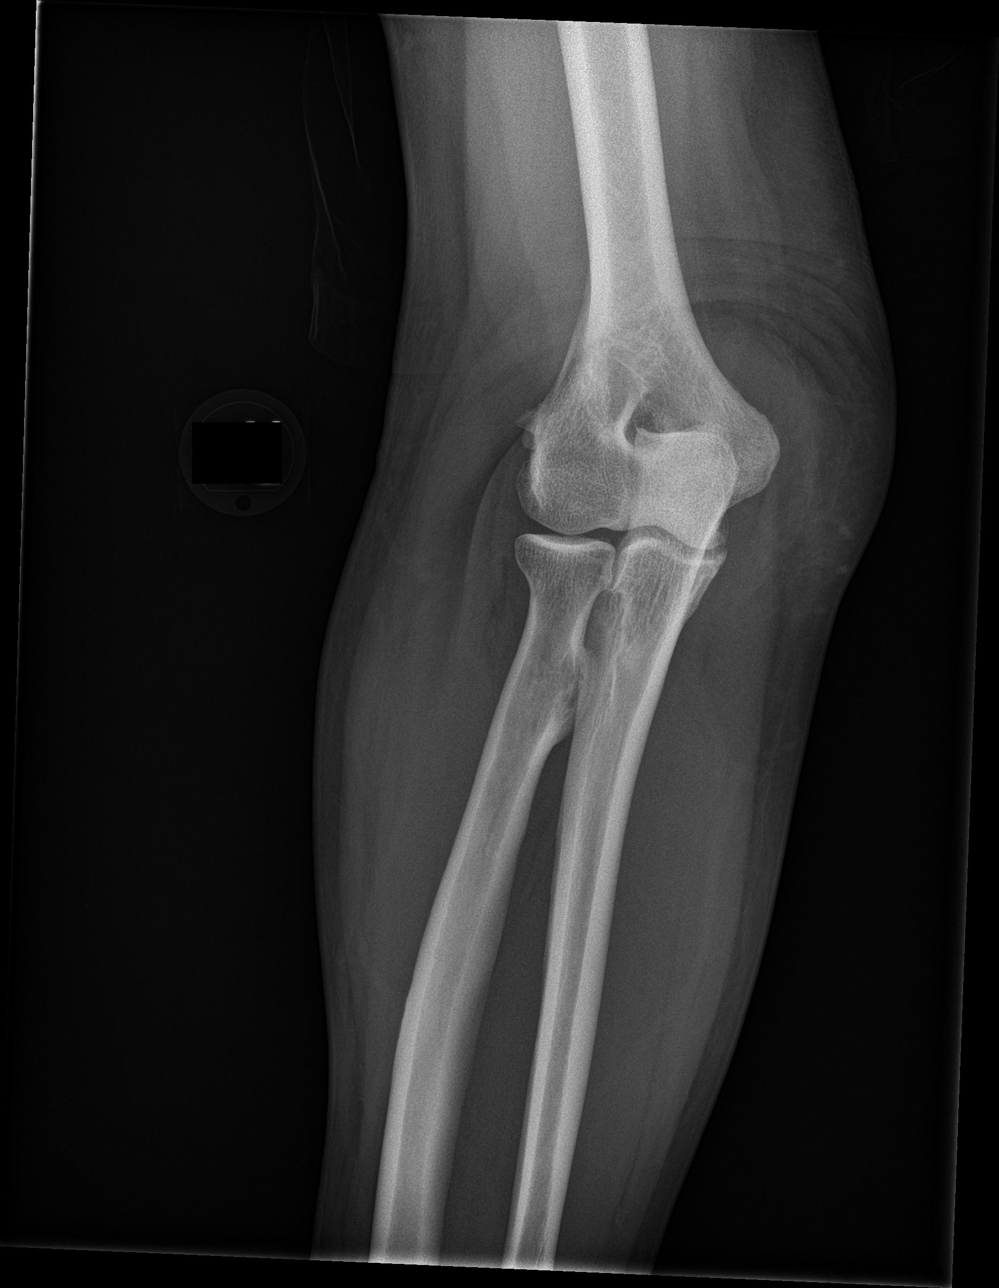
[im 2/4]
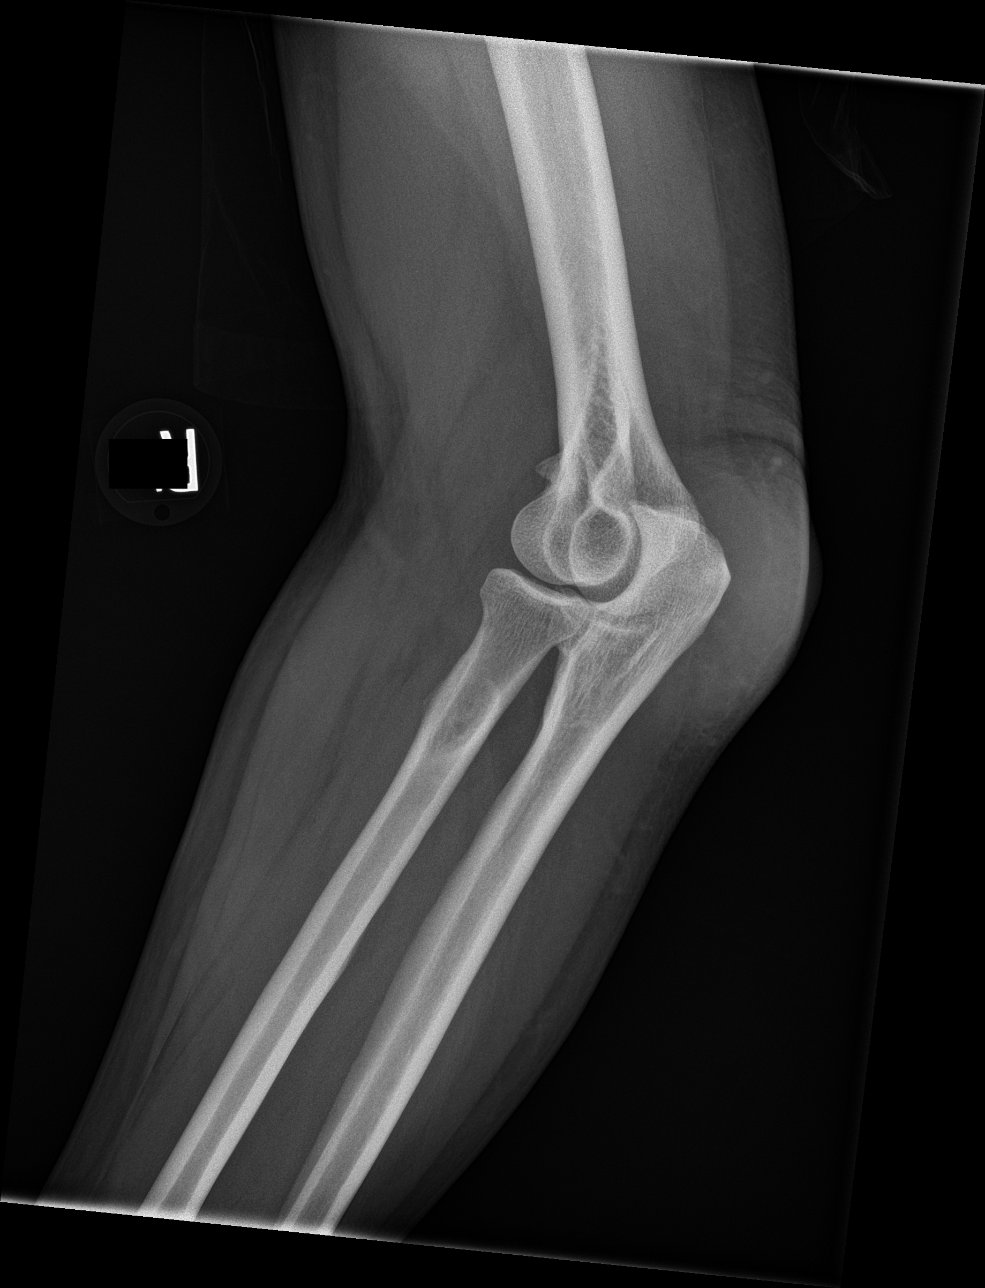
[im 3/4]
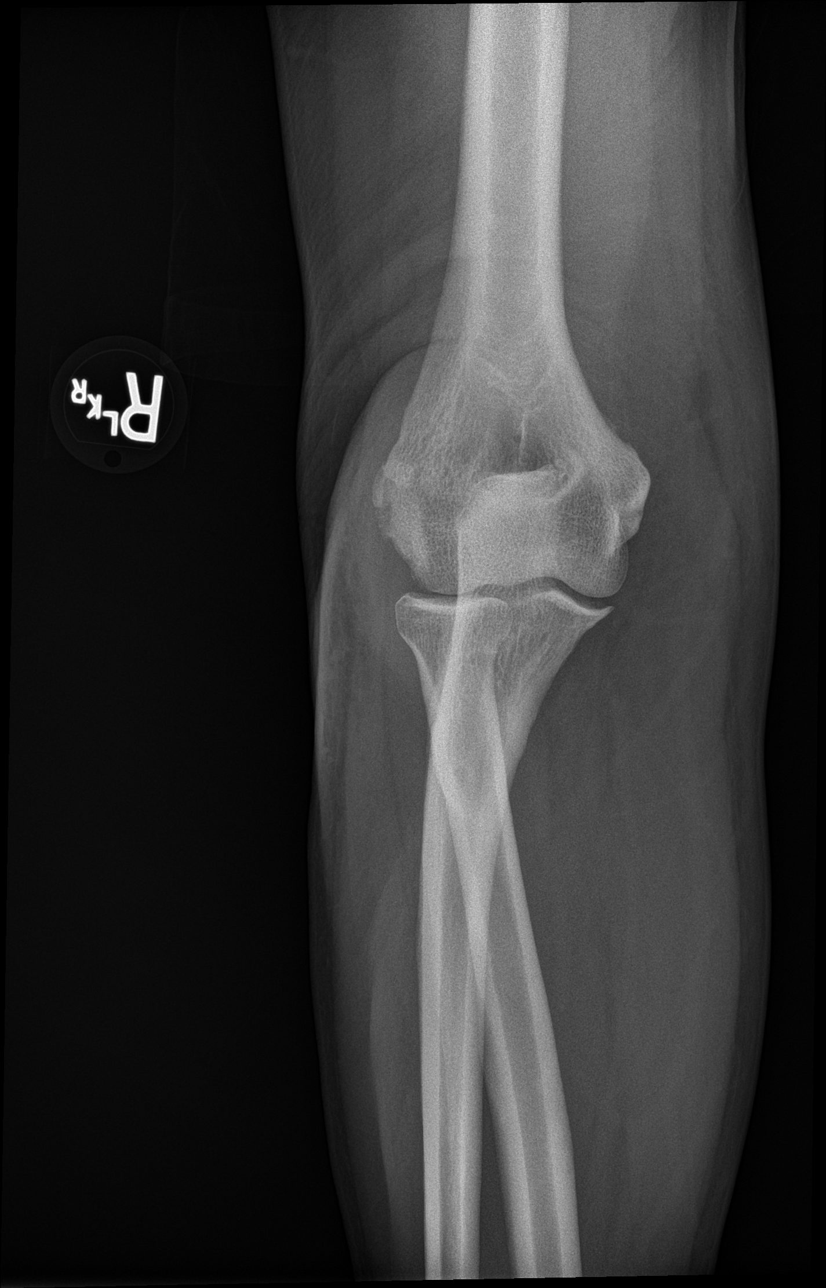
[im 4/4]
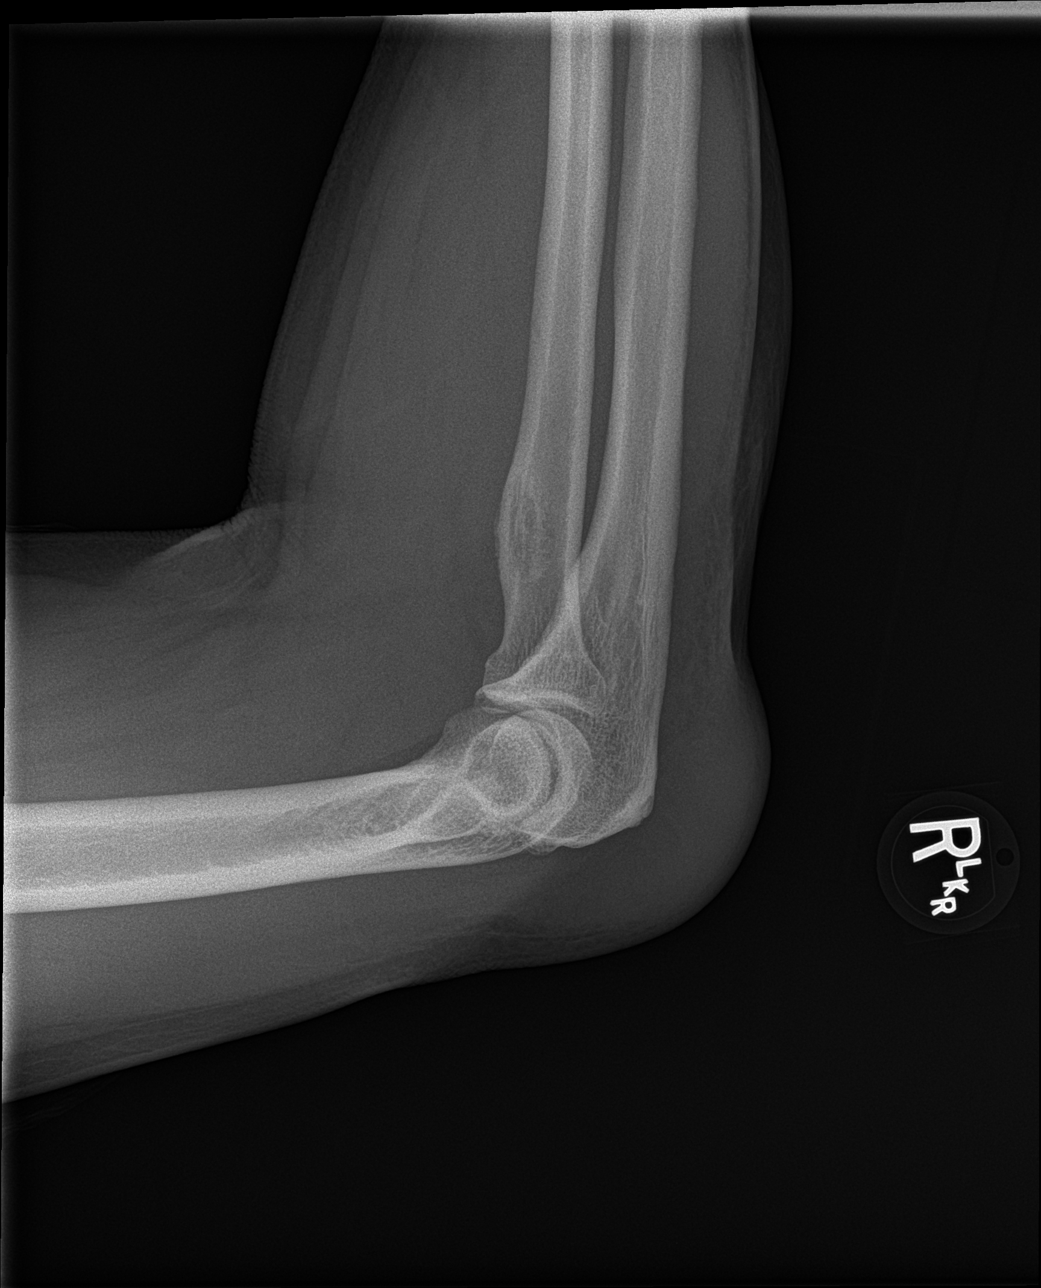

[4 of 4 positions shown; findings below may reference images not displayed]

FINDINGS: The mineralization and alignment are normal. There is no evidence of
acute fracture or dislocation. There is mild spurring of the medial
humeral epicondyle. The joint spaces are maintained. There is no
significant joint effusion. There is prominent soft tissue swelling
over the olecranon process without associated soft tissue
calcification or osseous erosion.
IMPRESSION: Prominent soft tissue swelling over the olecranon process most
consistent with olecranon bursitis ; consider gout. No acute osseous
findings.

## 2017-06-21 ENCOUNTER — Other Ambulatory Visit: Payer: Self-pay | Admitting: Family Medicine

## 2017-06-21 DIAGNOSIS — J0100 Acute maxillary sinusitis, unspecified: Secondary | ICD-10-CM

## 2017-06-21 NOTE — Progress Notes (Signed)
Received fax request from [facility] for flonase - is patient still taking or did he stop after being treated for sinus infection?  Please find out and let me know if refill is needed and I will provide.

## 2017-06-23 NOTE — Progress Notes (Signed)
Called patient. NA.

## 2017-07-18 ENCOUNTER — Telehealth: Payer: Self-pay | Admitting: Family Medicine

## 2017-07-18 NOTE — Telephone Encounter (Signed)
Medication review shows that Dr. Sherryll BurgerShah has not been the provider prescribing Meloxicam.  Please have pt contact whomever the provider is that did prescribe this for refills.

## 2017-07-18 NOTE — Telephone Encounter (Signed)
Copied from CRM [phone]. Topic: Quick Communication - Rx Refill/Question >> Jul 18, 2017 11:09 AM Alexander BergeronBarksdale, Harvey B wrote: Medication: meloxicam (MOBIC) 15 MG tablet [[id number]][[id number]]  Has the patient contacted their pharmacy? No. (Agent: If no, request that the patient contact the pharmacy for the refill.) Preferred Pharmacy (with phone number or street name): [facility] Agent: Please be advised that RX refills may take up to 3 business days. We ask that you follow-up with your pharmacy.

## 2017-07-19 NOTE — Telephone Encounter (Signed)
Documentation reviewed, will discuss at pt's appointment in April 2019

## 2017-07-19 NOTE — Telephone Encounter (Signed)
Do not need refills at this time, has appointment on April 29 an will discuss at that time. He had refills on file but due to Dr. Sherryll BurgerShah writing then and he is know longer in the state they will not fill

## 2017-08-16 ENCOUNTER — Encounter: Payer: Self-pay | Admitting: Nurse Practitioner

## 2017-08-16 ENCOUNTER — Ambulatory Visit: Payer: BLUE CROSS/BLUE SHIELD | Admitting: Nurse Practitioner

## 2017-08-16 VITALS — BP 132/74 | HR 96 | Temp 98.0°F | Resp 18 | Ht 69.0 in | Wt 236.4 lb

## 2017-08-16 DIAGNOSIS — M25561 Pain in right knee: Secondary | ICD-10-CM | POA: Diagnosis not present

## 2017-08-16 DIAGNOSIS — R200 Anesthesia of skin: Secondary | ICD-10-CM | POA: Diagnosis not present

## 2017-08-16 DIAGNOSIS — F419 Anxiety disorder, unspecified: Secondary | ICD-10-CM

## 2017-08-16 DIAGNOSIS — R351 Nocturia: Secondary | ICD-10-CM

## 2017-08-16 DIAGNOSIS — G479 Sleep disorder, unspecified: Secondary | ICD-10-CM

## 2017-08-16 DIAGNOSIS — Z5181 Encounter for therapeutic drug level monitoring: Secondary | ICD-10-CM

## 2017-08-16 DIAGNOSIS — E7800 Pure hypercholesterolemia, unspecified: Secondary | ICD-10-CM

## 2017-08-16 DIAGNOSIS — G8929 Other chronic pain: Secondary | ICD-10-CM | POA: Diagnosis not present

## 2017-08-16 DIAGNOSIS — R202 Paresthesia of skin: Secondary | ICD-10-CM

## 2017-08-16 DIAGNOSIS — E782 Mixed hyperlipidemia: Secondary | ICD-10-CM | POA: Diagnosis not present

## 2017-08-16 DIAGNOSIS — E1165 Type 2 diabetes mellitus with hyperglycemia: Secondary | ICD-10-CM | POA: Diagnosis not present

## 2017-08-16 DIAGNOSIS — E1159 Type 2 diabetes mellitus with other circulatory complications: Secondary | ICD-10-CM

## 2017-08-16 DIAGNOSIS — Z125 Encounter for screening for malignant neoplasm of prostate: Secondary | ICD-10-CM

## 2017-08-16 DIAGNOSIS — E559 Vitamin D deficiency, unspecified: Secondary | ICD-10-CM

## 2017-08-16 LAB — COMPLETE METABOLIC PANEL WITH GFR
AG Ratio: 1.2 (calc) (ref 1.0–2.5)
ALT: 9 U/L (ref 9–46)
AST: 12 U/L (ref 10–35)
Albumin: 3.8 g/dL (ref 3.6–5.1)
Alkaline phosphatase (APISO): 69 U/L (ref 40–115)
BUN: 16 mg/dL (ref 7–25)
CO2: 27 mmol/L (ref 20–32)
Calcium: 9.6 mg/dL (ref 8.6–10.3)
Chloride: 105 mmol/L (ref 98–110)
Creat: 0.81 mg/dL (ref 0.70–1.33)
GFR, Est African American: 113 mL/min/{1.73_m2} (ref >=60)
GFR, Est Non African American: 97 mL/min/{1.73_m2} (ref >=60)
Globulin: 3.3 g/dL (calc) (ref 1.9–3.7)
Glucose, Bld: 159 mg/dL — ABNORMAL HIGH (ref 65–99)
Potassium: 4.9 mmol/L (ref 3.5–5.3)
Sodium: 138 mmol/L (ref 135–146)
Total Bilirubin: 0.4 mg/dL (ref 0.2–1.2)
Total Protein: 7.1 g/dL (ref 6.1–8.1)

## 2017-08-16 LAB — LIPID PANEL
Cholesterol: 217 mg/dL — ABNORMAL HIGH (ref <200)
HDL: 47 mg/dL (ref >40)
LDL Cholesterol (Calc): 145 mg/dL (calc) — ABNORMAL HIGH
Non-HDL Cholesterol (Calc): 170 mg/dL (calc) — ABNORMAL HIGH (ref <130)
Total CHOL/HDL Ratio: 4.6 (calc) (ref <5.0)
Triglycerides: 125 mg/dL (ref <150)

## 2017-08-16 LAB — PSA: PSA: 0.2 ng/mL (ref <=4.0)

## 2017-08-16 MED ORDER — LISINOPRIL 5 MG PO TABS
5.0000 mg | ORAL_TABLET | Freq: Every day | ORAL | 0 refills | Status: DC
Start: 2017-08-16 — End: 2017-11-11

## 2017-08-16 MED ORDER — MELOXICAM 15 MG PO TABS: 15.0000 mg | ORAL_TABLET | Freq: Every day | ORAL | 2 refills | Status: AC

## 2017-08-16 MED ORDER — METFORMIN HCL 500 MG PO TABS: 500.0000 mg | ORAL_TABLET | Freq: Two times a day (BID) | ORAL | 0 refills | Status: DC

## 2017-08-16 MED ORDER — HYDROXYZINE PAMOATE 25 MG PO CAPS: 25.0000 mg | ORAL_CAPSULE | Freq: Three times a day (TID) | ORAL | 0 refills | Status: DC | PRN

## 2017-08-16 MED ORDER — GLUCOSE BLOOD VI STRP: ORAL_STRIP | 2 refills | Status: AC

## 2017-08-16 MED ORDER — ROSUVASTATIN CALCIUM 20 MG PO TABS: ORAL_TABLET | ORAL | 0 refills | Status: DC

## 2017-08-16 NOTE — Progress Notes (Addendum)
Name: Daniel Snyder   MRN: [id number]    DOB: 1955/09/03   Date:08/16/2017       Progress Note  Subjective  Chief Complaint  Chief Complaint  Patient presents with  . Hypertension    medication refills  . Hyperlipidemia  . Diabetes    HPI  Diabetes Mellitus Patient is rx metformin  Takes medications as prescribed with missed no doses a month.  Diet: meals two and half times a day; eats 2 eggs and sausage and hashbrowns and wheat toast in the morning, lunch- beans, salad, cheese burger and fries, meat loaf. Doesn't usually eat dinner- maybe [place] sandwich. Drinks- flavored water- 40 ounces, diet zero sprite. Snacks- chocolate  hasnt checked blood sugars since machine battery went bad.  Denies polyphagia, polydipsia, Endoses polyuria. Does go to the bathroom frequently at nighttime- maybe 4 times on average. Tries not to drink anything after 8pm. Denies hesitation or bladder fullness after urinating avoids caffeine after noon. Pt endorses intermittent tingling in bilateral feet after working. Denies pain, sts feet stay cold. No shob, orthopnea or lbp.   Lab Results  Component Value Date   HGBA1C 5.8 05/24/2017    Hyperlipidemia Patient rx crestor 20mg  nightly Takes medications as prescribed with no missed doses a month.  Diet: 6-7 vegetables a week. Eats out 7 imes times a week. Fried foods day  Denies myalgias  Vitamin D Deficiency   No longer taking vitamin D endorses fatigue  Arthritis in knee Meloxicam takes once daily and tylenol as needed a few times a week maybe.   Sleep disturbance Obstructive Sleep Apnea Screening The patient was screened with the STOP-BANG questionnaire. >3 positive responses is considered a positive screen  Snoring:  Unknown  Tiredness:  Yes Observed Apnea: Unknown  Pressure (HTN):  No BMI >35:  Yes Age > 50:  Yes Neck >17":  No  Gender (male):  Yes  Total:   3/8     Patient Active Problem List   Diagnosis Date Noted  . Hyperlipidemia 08/11/2016  . Vitamin D deficiency 08/11/2016  . Annual physical exam 05/05/2016  . Diabetes mellitus type 2, uncontrolled (HCC) 05/05/2016  . URI with cough and congestion 03/31/2016  . Paronychia of finger of right hand 03/09/2016  . Swelling of right elbow joint 08/27/2015  . Right knee pain 08/27/2015  . Numbness in feet 08/27/2015  . Anxiety 08/27/2015  . Hx of colonic polyps   . Rectal polyp     Past Medical History:  Diagnosis Date  . Anxiety   . Arthritis    fingers    Past Surgical History:  Procedure Laterality Date  . COLONOSCOPY N/A 10/10/2014   Procedure: COLONOSCOPY;  Surgeon: [name], MD;  Location: [facility];  Service: Gastroenterology;  Laterality: N/A;  PT WANTS EARLY  . POLYPECTOMY  10/10/2014   Procedure: POLYPECTOMY;  Surgeon: [name], MD;  Location: [facility];  Service: Gastroenterology;;    Social History   Tobacco Use  . Smoking status: Current Every Day Smoker    Packs/day: 0.50    Years: 20.00    Pack years: 10.00  . Smokeless tobacco: Never Used  Substance Use Topics  . Alcohol use: Yes    Alcohol/week: 2.4 oz    Types: 4 Cans of beer per week     Current Outpatient Medications:  .  ALPRAZolam (XANAX) 0.5 MG tablet, TAKE 1 TABLET BY MOUTH TWICE A DAY AS NEEDED FOR ANXIETY, Disp: 60 tablet, Rfl: 2 .  CONTOUR NEXT TEST test strip, USE AS INSTRUCTED TO CHECK BLOOD GLUCOSE ONCE DAILY*VERIFY METER WITH PT, NO ANSWER 10/30*NOTCOVERED, Disp: 90 each, Rfl: 2 .  lisinopril (PRINIVIL,ZESTRIL) 5 MG tablet, Take 1 tablet (5 mg total) by mouth daily., Disp: 90 tablet, Rfl: 0 .  metFORMIN (GLUCOPHAGE) 500 MG tablet, Take 1 tablet (500 mg total) by mouth 2 (two) times daily with a meal., Disp: 180 tablet, Rfl: 0 .  rosuvastatin (CRESTOR) 20 MG tablet, TAKE 1 TABLET BY MOUTH EVERYDAY AT BEDTIME, Disp: 90 tablet, Rfl: 0 .  albuterol  (PROVENTIL HFA;VENTOLIN HFA) 108 (90 Base) MCG/ACT inhaler, Inhale 2 puffs into the lungs every 6 (six) hours as needed for wheezing or shortness of breath. (Patient not taking: Reported on 08/16/2017), Disp: 1 Inhaler, Rfl: 0 .  fluticasone (FLONASE) 50 MCG/ACT nasal spray, SPRAY 2 SPRAYS INTO EACH NOSTRIL EVERY DAY (Patient not taking: Reported on 08/16/2017), Disp: 16 g, Rfl: 0 .  meloxicam (MOBIC) 15 MG tablet, , Disp: , Rfl: 1 .  Vitamin D, Ergocalciferol, (DRISDOL) 50000 units CAPS capsule, Take 1 capsule (50,000 Units total) by mouth once a week. For 12 weeks (Patient not taking: Reported on 05/24/2017), Disp: 12 capsule, Rfl: 0  No Known Allergies  ROS  Constitutional: Negative for fever or weight change.  Respiratory: Positive for cough Negative shortness of breath.   Cardiovascular: Negative for chest pain or palpitations.  Gastrointestinal: Negative for abdominal pain, no bowel changes.  Musculoskeletal: Negative for gait problem or joint swelling.  Skin: Negative for rash.  Neurological: Negative for dizziness or headache.  No other specific complaints in a complete review of systems (except as listed in HPI above).  Objective  Vitals:   08/16/17 0814  BP: 132/74  Pulse: 96  Resp: 18  Temp: 98 F (36.7 C)  TempSrc: Oral  SpO2: 97%  Weight: 236 lb 6.4 oz (107.2 kg)  Height: 5\' 9"  (1.753 m)    Body mass index is 34.91 kg/m.  Nursing Note and Vital Signs reviewed.  Physical Exam  Constitutional: Patient appears well-developed and well-nourished. Obese No distress.  HEENT: head atraumatic, normocephalic, pupils equal and reactive to light, TM's without erythema or bulging,  no maxillary or frontal sinus tenderness , neck supple without lymphadenopathy, oropharynx pink and moist without exudate, no nasal discharge Cardiovascular: Normal rate, regular rhythm, S1/S2 present.  No murmur or rub heard.  Pulmonary/Chest: Effort normal and breath sounds clear. No respiratory  distress or retractions. Abdominal: Soft and non-tender, bowel sounds present  Psychiatric: Patient has a normal mood and affect. behavior is normal. Judgment and thought content normal. Diabetic Foot Form - Detailed   Diabetic Foot Exam - detailed Diabetic Foot exam was performed with the following findings:  Yes 08/16/2017  8:46 AM  Visual Foot Exam completed.:  Yes  Pulse Foot Exam completed.:  Yes  Right posterior Tibialias:  Present Left posterior Tibialias:  Present  Right Dorsalis Pedis:  Present Left Dorsalis Pedis:  Present  Semmes-Weinstein Monofilament Test R Site 1-Great Toe:  Neg L Site 1-Great Toe:  Neg        Assessment & Plan   1. Chronic pain of right knee - tylenol, rest, ice   2. Anxiety  - hydrOXYzine (VISTARIL) 25 MG capsule; Take 1 capsule (25 mg total) by mouth 3 (three) times daily as needed.  Dispense: 30 capsule; Refill: 0  3. Vitamin D deficiency  - Vitamin D (25 hydroxy)  4. Medication monitoring encounter  - COMPLETE METABOLIC PANEL  WITH GFR  5. Controlled type 2 diabetes mellitus without complication, without long-term current use of insulin (HCC) -diet  - lisinopril (PRINIVIL,ZESTRIL) 5 MG tablet; Take 1 tablet (5 mg total) by mouth daily.  Dispense: 90 tablet; Refill: 0 - metFORMIN (GLUCOPHAGE) 500 MG tablet; Take 1 tablet (500 mg total) by mouth 2 (two) times daily with a meal.  Dispense: 180 tablet; Refill: 0 - A1C 6. Pure hypercholesterolemia -diet - rosuvastatin (CRESTOR) 20 MG tablet; TAKE 1 TABLET BY MOUTH EVERYDAY AT BEDTIME  Dispense: 90 tablet; Refill: 0  7. Nocturia - sleep apnea app  - PSA  8. Sleep disturbance -sleep apnea app and discussed referral to sleep study   9. Numbness and tingling of foot - discussed vascular referral or gabapentin denies presently    -Red flags and when to present for emergency care or RTC including fever >101.2F, chest pain, shortness of breath, new/worsening/un-resolving symptoms,  reviewed  with patient at time of visit. Follow up and care instructions discussed and provided in AVS.  -------------------------------- I have reviewed this encounter including the documentation in this note and/or discussed this patient with the provider, Sharyon CableElizabeth Zaid Tomes DNP AGNP-C. I am certifying that I agree with the content of this note as supervising physician. Baruch GoutyMelinda Lada, MD [facility] 08/17/2017, 9:26 AM

## 2017-08-16 NOTE — Patient Instructions (Addendum)
-   Take a daily multi-vitamin with Vitamin D in it  - Drink at least 64 ounces of water daily  - let me know if you would like to see a specialists or have any further testing for your tingling  - download app sleephealth to monitor for signs of sleep apnea and we can refer you to sleep study  If you have not heard anything from my staff in a week about any orders/referrals/studies from today, please contact us here to follow-up [phone]   Foods and drinks to limit include  . fried foods and other foods high in saturated fat and trans fat  . foods high in salt, also called sodium  . sweets, such as baked goods, candy, and ice cream  . beverages with added sugars, such as juice, regular soda, and regular sports or energy drinks  Drink water instead of sweetened beverages. Consider using a sugar substitute in your coffee or tea.   Instead, eat carbohydrates from fruit, vegetables, whole grains, beans, and low-fat or nonfat milk. Choose healthy carbohydrates, such as fruit, vegetables, whole grains, beans, and low-fat milk, as part of your diabetes meal plan.

## 2017-08-17 LAB — VITAMIN D 25 HYDROXY (VIT D DEFICIENCY, FRACTURES): Vit D, 25-Hydroxy: 17 ng/mL — ABNORMAL LOW (ref 30–100)

## 2017-08-17 LAB — HEMOGLOBIN A1C
Hgb A1c MFr Bld: 6.7 % of total Hgb — ABNORMAL HIGH (ref <5.7)
Mean Plasma Glucose: 146 (calc)
eAG (mmol/L): 8.1 (calc)

## 2017-08-22 ENCOUNTER — Ambulatory Visit: Payer: BLUE CROSS/BLUE SHIELD | Admitting: Nurse Practitioner

## 2017-11-06 ENCOUNTER — Other Ambulatory Visit: Payer: Self-pay | Admitting: Nurse Practitioner

## 2017-11-06 DIAGNOSIS — E1159 Type 2 diabetes mellitus with other circulatory complications: Secondary | ICD-10-CM

## 2017-11-06 DIAGNOSIS — E7800 Pure hypercholesterolemia, unspecified: Secondary | ICD-10-CM

## 2017-11-11 ENCOUNTER — Other Ambulatory Visit: Payer: Self-pay | Admitting: Nurse Practitioner

## 2017-11-11 DIAGNOSIS — E1159 Type 2 diabetes mellitus with other circulatory complications: Secondary | ICD-10-CM

## 2017-11-15 ENCOUNTER — Telehealth: Payer: Self-pay | Admitting: Nurse Practitioner

## 2017-11-15 ENCOUNTER — Encounter: Payer: Self-pay | Admitting: Nurse Practitioner

## 2017-11-15 ENCOUNTER — Ambulatory Visit: Payer: BLUE CROSS/BLUE SHIELD | Admitting: Nurse Practitioner

## 2017-11-15 VITALS — BP 130/70 | HR 85 | Temp 98.1°F | Resp 16 | Ht 69.0 in | Wt 229.9 lb

## 2017-11-15 DIAGNOSIS — F419 Anxiety disorder, unspecified: Secondary | ICD-10-CM

## 2017-11-15 DIAGNOSIS — R209 Unspecified disturbances of skin sensation: Secondary | ICD-10-CM

## 2017-11-15 DIAGNOSIS — Z1159 Encounter for screening for other viral diseases: Secondary | ICD-10-CM

## 2017-11-15 DIAGNOSIS — E559 Vitamin D deficiency, unspecified: Secondary | ICD-10-CM

## 2017-11-15 DIAGNOSIS — E7800 Pure hypercholesterolemia, unspecified: Secondary | ICD-10-CM

## 2017-11-15 DIAGNOSIS — E1159 Type 2 diabetes mellitus with other circulatory complications: Secondary | ICD-10-CM

## 2017-11-15 DIAGNOSIS — Z23 Encounter for immunization: Secondary | ICD-10-CM

## 2017-11-15 LAB — POCT UA - MICROALBUMIN: Microalbumin Ur, POC: 50 mg/L

## 2017-11-15 LAB — POCT GLYCOSYLATED HEMOGLOBIN (HGB A1C): Hemoglobin A1C: 6.4 % — AB (ref 4.0–5.6)

## 2017-11-15 MED ORDER — ROSUVASTATIN CALCIUM 40 MG PO TABS: ORAL_TABLET | ORAL | 0 refills | Status: DC

## 2017-11-15 MED ORDER — METFORMIN HCL 500 MG PO TABS
500.0000 mg | ORAL_TABLET | Freq: Two times a day (BID) | ORAL | 0 refills | Status: DC
Start: 2017-11-15 — End: 2018-02-05

## 2017-11-15 MED ORDER — BUSPIRONE HCL 5 MG PO TABS: 5.0000 mg | ORAL_TABLET | Freq: Two times a day (BID) | ORAL | 0 refills | Status: DC

## 2017-11-15 MED ORDER — LISINOPRIL 5 MG PO TABS: 5.0000 mg | ORAL_TABLET | Freq: Every day | ORAL | 0 refills | Status: DC

## 2017-11-15 MED ORDER — HYDROXYZINE PAMOATE 25 MG PO CAPS: 25.0000 mg | ORAL_CAPSULE | Freq: Three times a day (TID) | ORAL | 2 refills | Status: DC | PRN

## 2017-11-15 NOTE — Telephone Encounter (Signed)
Discussed case with Dr. Sherie DonLada recommends ABI to look for peripheral vascular disease; we can do this and as mentioned before still offering to refer you to vascular. You should receive a phone call within the next month to set up an appointment for this testing.

## 2017-11-15 NOTE — Progress Notes (Addendum)
Name: Daniel Snyder   MRN: [id number]    DOB: [birth year]   Date:11/15/2017       Progress Note  Chief Complaint  Chief Complaint  Patient presents with  . Diabetes  . Hypertension  . Hyperlipidemia  . Anxiety  . Insomnia    HPI  Diabetes Mellitus Patient is taking Metformin 500 daily. Takes medications as prescribed with 0 missed doses a month.  Diet: eating on the go- has been trying to eat better- less greasy food more fruit, and baked or grilled chicken. Drinks at least 60 ounces of water a day.  Checks blood sugars once a week- states just checks it randomly and last one was 180. He complains of numbness, tingling and cold feeling in his toes that has been going on x 2 months. Denies polyphagia, polydipsia, polyuria.  Takes lisinopril for nephroprotection. Takes medications as prescribed with 6 missed doses a month.  He is compliant with low-salt diet.  Hyperlipidemia Patient rx Crestor 20 mg Takes medications as prescribed with no missed doses a month.  Diet: patient eats out maybe 3 days a week, has been increasing vegetable intake and drinking more water.  Denies myalgias Lab Results  Component Value Date   LDLCALC 145 (H) 08/16/2017    Anxiety He continues to have anxiety and panic attacks. He was taking Xanax and the medication was stopped. He was started on  Vistaril states takes it at least once a day but often twice a day and would like medication refilled. Patient feels work and family cause lots of anxiety- states lots of worrying and feels very irritable  GAD 7 : Generalized Anxiety Score 11/15/2017  Nervous, Anxious, on Edge 2  Control/stop worrying 3  Worry too much - different things 3  Trouble relaxing 3  Restless 3  Easily annoyed or irritable 3  Afraid - awful might happen 2  Total GAD 7 Score 19  Anxiety Difficulty Very difficult    Vitamin D deficiency  Patient endorses fatigue states started taking vitamin D 5,000 units OTC states tries to take it  daily.    Patient Active Problem List   Diagnosis Date Noted  . Hyperlipidemia 08/11/2016  . Vitamin D deficiency 08/11/2016  . Annual physical exam 05/05/2016  . Diabetes mellitus type 2, uncontrolled (HCC) 05/05/2016  . URI with cough and congestion 03/31/2016  . Paronychia of finger of right hand 03/09/2016  . Osteoarthritis of knee 09/08/2015  . Olecranon bursitis 09/08/2015  . Swelling of right elbow joint 08/27/2015  . Right knee pain 08/27/2015  . Numbness in feet 08/27/2015  . Anxiety 08/27/2015  . Hx of colonic polyps   . Rectal polyp     Past Medical History:  Diagnosis Date  . Anxiety   . Arthritis    fingers    Past Surgical History:  Procedure Laterality Date  . COLONOSCOPY N/A 10/10/2014   Procedure: COLONOSCOPY;  Surgeon: [name], MD;  Location: [facility];  Service: Gastroenterology;  Laterality: N/A;  PT WANTS EARLY  . POLYPECTOMY  10/10/2014   Procedure: POLYPECTOMY;  Surgeon: [name], MD;  Location: [facility];  Service: Gastroenterology;;    Social History   Tobacco Use  . Smoking status: Current Every Day Smoker    Packs/day: 0.50    Years: 20.00    Pack years: 10.00  . Smokeless tobacco: Never Used  Substance Use Topics  . Alcohol use: Yes    Alcohol/week: 2.4 oz    Types:  4 Cans of beer per week     Current Outpatient Medications:  .  glucose blood (CONTOUR NEXT TEST) test strip, USE AS INSTRUCTED TO CHECK BLOOD GLUCOSE ONCE DAILY*VERIFY METER WITH PT, NO ANSWER 10/30*NOTCOVERED, Disp: 90 each, Rfl: 2 .  hydrOXYzine (VISTARIL) 25 MG capsule, Take 1 capsule (25 mg total) by mouth 3 (three) times daily as needed., Disp: 30 capsule, Rfl: 0 .  lisinopril (PRINIVIL,ZESTRIL) 5 MG tablet, TAKE 1 TABLET BY MOUTH EVERY DAY, Disp: 90 tablet, Rfl: 0 .  metFORMIN (GLUCOPHAGE) 500 MG tablet, Take 1 tablet (500 mg total) by mouth 2 (two) times daily with a meal for 18 days. Refill till appt, Disp: 36 tablet, Rfl: 0 .   rosuvastatin (CRESTOR) 20 MG tablet, TAKE 1 TABLET BY MOUTH EVERYDAY AT BEDTIME, Disp: 90 tablet, Rfl: 0  No Known Allergies  Review of Systems  Eyes: Negative for blurred vision.  Respiratory: Negative for shortness of breath.   Cardiovascular: Negative for chest pain and leg swelling.  Musculoskeletal: Negative for falls.  Neurological: Positive for tingling.       And numbness and coldness in his toes.  Psychiatric/Behavioral: The patient is nervous/anxious and has insomnia.      No other specific complaints in a complete review of systems (except as listed in HPI above).  Objective  Vitals:   11/15/17 0835  BP: 130/70  Pulse: 85  Resp: 16  Temp: 98.1 F (36.7 C)  TempSrc: Oral  SpO2: 98%  Weight: 229 lb 14.4 oz (104.3 kg)  Height: 5\' 9"  (1.753 m)    Body mass index is 33.95 kg/m.  Nursing Note and Vital Signs reviewed.  Physical Exam  Constitutional: He is oriented to person, place, and time. He appears well-developed and well-nourished.  HENT:  Head: Normocephalic and atraumatic.  Neck: Normal range of motion. Neck supple. No JVD present. No thyromegaly present.  Cardiovascular: Regular rhythm, normal heart sounds and intact distal pulses.  Pulmonary/Chest: Effort normal and breath sounds normal.  Abdominal: Soft. Bowel sounds are normal. There is no tenderness.  Musculoskeletal: Normal range of motion.  Neurological: He is alert and oriented to person, place, and time.  Skin: Skin is warm and dry.  Psychiatric: He has a normal mood and affect. His behavior is normal. Judgment and thought content normal.    No results found for this or any previous visit (from the past 48 hour(s)).  Assessment & Plan  1. Controlled type 2 diabetes mellitus with other circulatory complication, without long-term current use of insulin (HCC) Discussed diet  - POCT HgB A1C - POCT UA - Microalbumin - lisinopril (PRINIVIL,ZESTRIL) 5 MG tablet; Take 1 tablet (5 mg total) by  mouth daily.  Dispense: 90 tablet; Refill: 0 - metFORMIN (GLUCOPHAGE) 500 MG tablet; Take 1 tablet (500 mg total) by mouth 2 (two) times daily with a meal for 18 days. Refill till appt  Dispense: 180 tablet; Refill: 0  2. Pure hypercholesterolemia - Continue to improve diet- patient fasting today will see how labs have improved still increased crestor due to LDL goal of 70 with DM risk factor  - rosuvastatin (CRESTOR) 40 MG tablet; Take one tablet  at night  Dispense: 90 tablet; Refill: 0 - Lipid Profile  3. Anxiety Stop hydroxyzine; do not drink alcohol or grape fruit juice on this new medicine. Follow up in one month for recheck on anxiety  - busPIRone (BUSPAR) 5 MG tablet; Take 1 tablet (5 mg total) by mouth 2 (two) times  daily.  Dispense: 60 tablet; Refill: 0  4. Vitamin D deficiency Continue supplementation  - Vitamin D (25 hydroxy)  5. Need for hepatitis C screening test - Hepatitis C antibody  6. Need for Tdap vaccination - Tdap vaccine greater than or equal to 7yo IM   -Red flags and when to present for emergency care or RTC including fever >101.29F, chest pain, shortness of breath, new/worsening/un-resolving symptoms, reviewed with patient at time of visit. Follow up and care instructions discussed and provided in AVS. -Reviewed Health Maintenance: Patient given release of information to take to his next eye doctor appointment   -------------------------------------------- See phone note about testing for PAD I have reviewed this encounter including the documentation in this note and/or discussed this patient with the provider, Sharyon Cable DNP AGNP-C. I am certifying that I agree with the content of this note as supervising physician. Baruch Gouty, MD [facility] 11/15/2017, 6:13 PM

## 2017-11-15 NOTE — Patient Instructions (Addendum)
- We are switching your anxiety medicine from hydroxyzine to buspirone twice a day. Please avoid grapefruit juice and alcohol when taking this medications. Follow- up in one month to see how this medications is working.  - Recommend drinking at least 64 ounces of water a day, please drink more than that if you are outside or sweating a lot. Avoid sweetened beverages.  - We will Increase your cholesterol medicine to 40mg  oc crestor at night because our goal for your LDL (bad cholesterol) is under 70 because you have diabetes. -Your A1C has improved from 6.7 3 months ago to 6.4 today. This is great work. PPlease continue working on eating a healthier diet, choosing grilled and baked foods over fried foods, including more vegetables and fruits. Make sure to schedule and appointment with your eye doctor.    Steps to Quit Smoking Smoking tobacco can be harmful to your health and can affect almost every organ in your body. Smoking puts you, and those around you, at risk for developing many serious chronic diseases. Quitting smoking is difficult, but it is one of the best things that you can do for your health. It is never too late to quit. What are the benefits of quitting smoking? When you quit smoking, you lower your risk of developing serious diseases and conditions, such as:  Lung cancer or lung disease, such as COPD.  Heart disease.  Stroke.  Heart attack.  Infertility.  Osteoporosis and bone fractures.  Additionally, symptoms such as coughing, wheezing, and shortness of breath may get better when you quit. You may also find that you get sick less often because your body is stronger at fighting off colds and infections. If you are pregnant, quitting smoking can help to reduce your chances of having a baby of low birth weight. How do I get ready to quit? When you decide to quit smoking, create a plan to make sure that you are successful. Before you quit:  Pick a date to quit. Set a date  within the next two weeks to give you time to prepare.  Write down the reasons why you are quitting. Keep this list in places where you will see it often, such as on your bathroom mirror or in your car or wallet.  Identify the people, places, things, and activities that make you want to smoke (triggers) and avoid them. Make sure to take these actions: ? Throw away all cigarettes at home, at work, and in your car. ? Throw away smoking accessories, such as Set designer. ? Clean your car and make sure to empty the ashtray. ? Clean your home, including curtains and carpets.  Tell your family, friends, and coworkers that you are quitting. Support from your loved ones can make quitting easier.  Talk with your health care provider about your options for quitting smoking.  Find out what treatment options are covered by your health insurance.  What strategies can I use to quit smoking? Talk with your healthcare provider about different strategies to quit smoking. Some strategies include:  Quitting smoking altogether instead of gradually lessening how much you smoke over a period of time. Research shows that quitting "cold [place]" is more successful than gradually quitting.  Attending in-person counseling to help you build problem-solving skills. You are more likely to have success in quitting if you attend several counseling sessions. Even short sessions of 10 minutes can be effective.  Finding resources and support systems that can help you to quit smoking and  remain smoke-free after you quit. These resources are most helpful when you use them often. They can include: ? Online chats with a Veterinary surgeoncounselor. ? Telephone quitlines. ? Automotive engineerrinted self-help materials. ? Support groups or group counseling. ? Text messaging programs. ? Mobile phone applications.  Taking medicines to help you quit smoking. (If you are pregnant or breastfeeding, talk with your health care provider first.) Some medicines  contain nicotine and some do not. Both types of medicines help with cravings, but the medicines that include nicotine help to relieve withdrawal symptoms. Your health care provider may recommend: ? Nicotine patches, gum, or lozenges. ? Nicotine inhalers or sprays. ? Non-nicotine medicine that is taken by mouth.  Talk with your health care provider about combining strategies, such as taking medicines while you are also receiving in-person counseling. Using these two strategies together makes you more likely to succeed in quitting than if you used either strategy on its own. If you are pregnant or breastfeeding, talk with your health care provider about finding counseling or other support strategies to quit smoking. Do not take medicine to help you quit smoking unless told to do so by your health care provider. What things can I do to make it easier to quit? Quitting smoking might feel overwhelming at first, but there is a lot that you can do to make it easier. Take these important actions:  Reach out to your family and friends and ask that they support and encourage you during this time. Call telephone quitlines, reach out to support groups, or work with a counselor for support.  Ask people who smoke to avoid smoking around you.  Avoid places that trigger you to smoke, such as bars, parties, or smoke-break areas at work.  Spend time around people who do not smoke.  Lessen stress in your life, because stress can be a smoking trigger for some people. To lessen stress, try: ? Exercising regularly. ? Deep-breathing exercises. ? Yoga. ? Meditating. ? Performing a body scan. This involves closing your eyes, scanning your body from head to toe, and noticing which parts of your body are particularly tense. Purposefully relax the muscles in those areas.  Download or purchase mobile phone or tablet apps (applications) that can help you stick to your quit plan by providing reminders, tips, and  encouragement. There are many free apps, such as QuitGuide from the [employer] developer(Centers for Disease Control and Prevention). You can find other support for quitting smoking (smoking cessation) through smokefree.gov and other websites.  How will I feel when I quit smoking? Within the first 24 hours of quitting smoking, you may start to feel some withdrawal symptoms. These symptoms are usually most noticeable 2-3 days after quitting, but they usually do not last beyond 2-3 weeks. Changes or symptoms that you might experience include:  Mood swings.  Restlessness, anxiety, or irritation.  Difficulty concentrating.  Dizziness.  Strong cravings for sugary foods in addition to nicotine.  Mild weight gain.  Constipation.  Nausea.  Coughing or a sore throat.  Changes in how your medicines work in your body.  A depressed mood.  Difficulty sleeping (insomnia).  After the first 2-3 weeks of quitting, you may start to notice more positive results, such as:  Improved sense of smell and taste.  Decreased coughing and sore throat.  Slower heart rate.  Lower blood pressure.  Clearer skin.  The ability to breathe more easily.  Fewer sick days.  Quitting smoking is very challenging for most people. Do not  get discouraged if you are not successful the first time. Some people need to make many attempts to quit before they achieve long-term success. Do your best to stick to your quit plan, and talk with your health care provider if you have any questions or concerns. This information is not intended to replace advice given to you by your health care provider. Make sure you discuss any questions you have with your health care provider. Document Released: 04/06/2001 Document Revised: 12/09/2015 Document Reviewed: 08/27/2014 Elsevier Interactive Patient Education  2018 [employer].   DASH Eating Plan DASH stands for "Dietary Approaches to Stop Hypertension." The DASH eating plan is a healthy  eating plan that has been shown to reduce high blood pressure (hypertension). It may also reduce your risk for type 2 diabetes, heart disease, and stroke. The DASH eating plan may also help with weight loss. What are tips for following this plan? General guidelines  Avoid eating more than 2,300 mg (milligrams) of salt (sodium) a day. If you have hypertension, you may need to reduce your sodium intake to 1,500 mg a day.  Limit alcohol intake to no more than 1 drink a day for nonpregnant women and 2 drinks a day for men. One drink equals 12 oz of beer, 5 oz of wine, or 1 oz of hard liquor.  Work with your health care provider to maintain a healthy body weight or to lose weight. Ask what an ideal weight is for you.  Get at least 30 minutes of exercise that causes your heart to beat faster (aerobic exercise) most days of the week. Activities may include walking, swimming, or biking.  Work with your health care provider or diet and nutrition specialist (dietitian) to adjust your eating plan to your individual calorie needs. Reading food labels  Check food labels for the amount of sodium per serving. Choose foods with less than 5 percent of the Daily Value of sodium. Generally, foods with less than 300 mg of sodium per serving fit into this eating plan.  To find whole grains, look for the word "whole" as the first word in the ingredient list. Shopping  Buy products labeled as "low-sodium" or "no salt added."  Buy fresh foods. Avoid canned foods and premade or frozen meals. Cooking  Avoid adding salt when cooking. Use salt-free seasonings or herbs instead of table salt or sea salt. Check with your health care provider or pharmacist before using salt substitutes.  Do not fry foods. Cook foods using healthy methods such as baking, boiling, grilling, and broiling instead.  Cook with heart-healthy oils, such as olive, canola, soybean, or sunflower oil. Meal planning   Eat a balanced diet that  includes: ? 5 or more servings of fruits and vegetables each day. At each meal, try to fill half of your plate with fruits and vegetables. ? Up to 6-8 servings of whole grains each day. ? Less than 6 oz of lean meat, poultry, or fish each day. A 3-oz serving of meat is about the same size as a deck of cards. One egg equals 1 oz. ? 2 servings of low-fat dairy each day. ? A serving of nuts, seeds, or beans 5 times each week. ? Heart-healthy fats. Healthy fats called Omega-3 fatty acids are found in foods such as flaxseeds and coldwater fish, like sardines, salmon, and mackerel.  Limit how much you eat of the following: ? Canned or prepackaged foods. ? Food that is high in trans fat, such as fried foods. ?  Food that is high in saturated fat, such as fatty meat. ? Sweets, desserts, sugary drinks, and other foods with added sugar. ? Full-fat dairy products.  Do not salt foods before eating.  Try to eat at least 2 vegetarian meals each week.  Eat more home-cooked food and less restaurant, buffet, and fast food.  When eating at a restaurant, ask that your food be prepared with less salt or no salt, if possible. What foods are recommended? The items listed may not be a complete list. Talk with your dietitian about what dietary choices are best for you. Grains Whole-grain or whole-wheat bread. Whole-grain or whole-wheat pasta. Brown rice. Orpah Cobb. Bulgur. Whole-grain and low-sodium cereals. Pita bread. Low-fat, low-sodium crackers. Whole-wheat flour tortillas. Vegetables Fresh or frozen vegetables (raw, steamed, roasted, or grilled). Low-sodium or reduced-sodium tomato and vegetable juice. Low-sodium or reduced-sodium tomato sauce and tomato paste. Low-sodium or reduced-sodium canned vegetables. Fruits All fresh, dried, or frozen fruit. Canned fruit in natural juice (without added sugar). Meat and other protein foods Skinless chicken or [place]. Ground chicken or [place]. Pork with fat  trimmed off. Fish and seafood. Egg whites. Dried beans, peas, or lentils. Unsalted nuts, nut butters, and seeds. Unsalted canned beans. Lean cuts of beef with fat trimmed off. Low-sodium, lean deli meat. Dairy Low-fat (1%) or fat-free (skim) milk. Fat-free, low-fat, or reduced-fat cheeses. Nonfat, low-sodium ricotta or cottage cheese. Low-fat or nonfat yogurt. Low-fat, low-sodium cheese. Fats and oils Soft margarine without trans fats. Vegetable oil. Low-fat, reduced-fat, or light mayonnaise and salad dressings (reduced-sodium). Canola, safflower, olive, soybean, and sunflower oils. Avocado. Seasoning and other foods Herbs. Spices. Seasoning mixes without salt. Unsalted popcorn and pretzels. Fat-free sweets. What foods are not recommended? The items listed may not be a complete list. Talk with your dietitian about what dietary choices are best for you. Grains Baked goods made with fat, such as croissants, muffins, or some breads. Dry pasta or rice meal packs. Vegetables Creamed or fried vegetables. Vegetables in a cheese sauce. Regular canned vegetables (not low-sodium or reduced-sodium). Regular canned tomato sauce and paste (not low-sodium or reduced-sodium). Regular tomato and vegetable juice (not low-sodium or reduced-sodium). Rosita Fire. Olives. Fruits Canned fruit in a light or heavy syrup. Fried fruit. Fruit in cream or butter sauce. Meat and other protein foods Fatty cuts of meat. Ribs. Fried meat. Tomasa Blase. Sausage. Bologna and other processed lunch meats. Salami. Fatback. Hotdogs. Bratwurst. Salted nuts and seeds. Canned beans with added salt. Canned or smoked fish. Whole eggs or egg yolks. Chicken or [place] with skin. Dairy Whole or 2% milk, cream, and half-and-half. Whole or full-fat cream cheese. Whole-fat or sweetened yogurt. Full-fat cheese. Nondairy creamers. Whipped toppings. Processed cheese and cheese spreads. Fats and oils Butter. Stick margarine. Lard. Shortening. Ghee. Bacon fat.  Tropical oils, such as coconut, palm kernel, or palm oil. Seasoning and other foods Salted popcorn and pretzels. Onion salt, garlic salt, seasoned salt, table salt, and sea salt. Worcestershire sauce. Tartar sauce. Barbecue sauce. Teriyaki sauce. Soy sauce, including reduced-sodium. Steak sauce. Canned and packaged gravies. Fish sauce. Oyster sauce. Cocktail sauce. Horseradish that you find on the shelf. Ketchup. Mustard. Meat flavorings and tenderizers. Bouillon cubes. Hot sauce and Tabasco sauce. Premade or packaged marinades. Premade or packaged taco seasonings. Relishes. Regular salad dressings. Where to find more information:  National Heart, Lung, and Blood Institute: PopSteam.is  American Heart Association: www.heart.org Summary  The DASH eating plan is a healthy eating plan that has been shown to reduce high blood  pressure (hypertension). It may also reduce your risk for type 2 diabetes, heart disease, and stroke.  With the DASH eating plan, you should limit salt (sodium) intake to 2,300 mg a day. If you have hypertension, you may need to reduce your sodium intake to 1,500 mg a day.  When on the DASH eating plan, aim to eat more fresh fruits and vegetables, whole grains, lean proteins, low-fat dairy, and heart-healthy fats.  Work with your health care provider or diet and nutrition specialist (dietitian) to adjust your eating plan to your individual calorie needs. This information is not intended to replace advice given to you by your health care provider. Make sure you discuss any questions you have with your health care provider. Document Released: 04/01/2011 Document Revised: 04/05/2016 Document Reviewed: 04/05/2016 Elsevier Interactive Patient Education  [employer].

## 2017-11-16 LAB — HEPATITIS C ANTIBODY
Hepatitis C Ab: NONREACTIVE
SIGNAL TO CUT-OFF: 0.02 (ref <1.00)

## 2017-11-16 LAB — LIPID PANEL
Cholesterol: 157 mg/dL (ref <200)
HDL: 44 mg/dL (ref >40)
LDL Cholesterol (Calc): 85 mg/dL (calc)
Non-HDL Cholesterol (Calc): 113 mg/dL (calc) (ref <130)
Total CHOL/HDL Ratio: 3.6 (calc) (ref <5.0)
Triglycerides: 180 mg/dL — ABNORMAL HIGH (ref <150)

## 2017-11-16 LAB — VITAMIN D 25 HYDROXY (VIT D DEFICIENCY, FRACTURES): Vit D, 25-Hydroxy: 28 ng/mL — ABNORMAL LOW (ref 30–100)

## 2017-11-16 NOTE — Telephone Encounter (Signed)
Patient called.  Patient aware.  

## 2017-12-07 ENCOUNTER — Other Ambulatory Visit: Payer: Self-pay | Admitting: Nurse Practitioner

## 2017-12-07 DIAGNOSIS — F419 Anxiety disorder, unspecified: Secondary | ICD-10-CM

## 2017-12-12 ENCOUNTER — Other Ambulatory Visit: Payer: Self-pay | Admitting: Nurse Practitioner

## 2017-12-12 DIAGNOSIS — F419 Anxiety disorder, unspecified: Secondary | ICD-10-CM

## 2017-12-16 ENCOUNTER — Encounter: Payer: Self-pay | Admitting: Family Medicine

## 2017-12-16 ENCOUNTER — Ambulatory Visit: Payer: Self-pay | Admitting: Family Medicine

## 2017-12-16 VITALS — BP 118/76 | HR 93 | Temp 98.1°F | Resp 18 | Ht 67.0 in | Wt 234.1 lb

## 2017-12-16 DIAGNOSIS — Z6834 Body mass index (BMI) 34.0-34.9, adult: Secondary | ICD-10-CM | POA: Insufficient documentation

## 2017-12-16 DIAGNOSIS — Z6836 Body mass index (BMI) 36.0-36.9, adult: Secondary | ICD-10-CM

## 2017-12-16 DIAGNOSIS — Z716 Tobacco abuse counseling: Secondary | ICD-10-CM | POA: Insufficient documentation

## 2017-12-16 DIAGNOSIS — F419 Anxiety disorder, unspecified: Secondary | ICD-10-CM

## 2017-12-16 DIAGNOSIS — E6609 Other obesity due to excess calories: Secondary | ICD-10-CM | POA: Insufficient documentation

## 2017-12-16 MED ORDER — BUPROPION HCL ER (XL) 150 MG PO TB24: 150.0000 mg | ORAL_TABLET | Freq: Every day | ORAL | 0 refills | Status: DC

## 2017-12-16 MED ORDER — HYDROXYZINE PAMOATE 25 MG PO CAPS
ORAL_CAPSULE | ORAL | 0 refills | Status: DC
Start: 2017-12-16 — End: 2018-01-26

## 2017-12-16 MED ORDER — BUSPIRONE HCL 5 MG PO TABS: 5.0000 mg | ORAL_TABLET | Freq: Two times a day (BID) | ORAL | 0 refills | Status: DC

## 2017-12-16 NOTE — Progress Notes (Signed)
Name: Jason FilaDavid K Royals   MRN: [id number]    DOB: 09/25/1955   Date:12/16/2017       Progress Note  Subjective  Chief Complaint  Chief Complaint  Patient presents with  . Anxiety    1 month follow up    HPI  Pt presents for follow up on the following:  Anxiety: He was taking Xanx, was switched to hydroxyzine but was taking it 1-2 times daily.  He saw Sharyon CableElizabeth Poulose NP last visit and she started him on buspar - he is only taking 5mg  once daily at this time and reports still taking hydroxyzine once daily. We will increase buspar to BID dosing and encourage only PRN use of hydroxyzine.   Obesity: He has been eating more vegetables and decreasing greasy foods; not drinking alcohol anymore.  Walking 3 miles a day and is doing well.  His girlfriend is helping him and motivating him.  He has gained 5lbs since last visit.  Tobacco Use: Is trying to quit - has cut back significantly - smoking 1/3ppd.  He currently smokes 1/3 packs per day. Cessation Techniques Discussed: removing cigarettes and smoking materials from environment, stress management and support of family/friends I advised patient to quit, and offered support. I spent approximately 10 minutes counseling the patient.  Discussed medication options - will try Wellbutrin  Patient Active Problem List   Diagnosis Date Noted  . Hyperlipidemia 08/11/2016  . Vitamin D deficiency 08/11/2016  . Annual physical exam 05/05/2016  . Diabetes mellitus type 2, uncontrolled (HCC) 05/05/2016  . Paronychia of finger of right hand 03/09/2016  . Osteoarthritis of knee 09/08/2015  . Olecranon bursitis 09/08/2015  . Swelling of right elbow joint 08/27/2015  . Right knee pain 08/27/2015  . Numbness in feet 08/27/2015  . Anxiety 08/27/2015  . Hx of colonic polyps   . Rectal polyp     Past Surgical History:  Procedure Laterality Date  . COLONOSCOPY N/A 10/10/2014   Procedure: COLONOSCOPY;  Surgeon: Midge Miniumarren Wohl, MD;  [facility];  Service: Gastroenterology;  Laterality: N/A;  PT WANTS EARLY  . POLYPECTOMY  10/10/2014   Procedure: POLYPECTOMY;  Surgeon: Midge Miniumarren Wohl, MD;  [facility];  Service: Gastroenterology;;    Family History  Problem Relation Age of Onset  . COPD Mother   . Cancer Father   . Emphysema Father   . Diabetes Sister   . Heart attack Brother   . Heart disease Paternal Grandfather   . Heart attack Paternal Grandfather     Social History   Socioeconomic History  . Marital status: Married    Spouse name: Not on file  . Number of children: Not on file  . Years of education: Not on file  . Highest education level: Not on file  Occupational History  . Not on file  Social Needs  . Financial resource strain: Not on file  . Food insecurity:    Worry: Not on file    Inability: Not on file  . Transportation needs:    Medical: Not on file    Non-medical: Not on file  Tobacco Use  . Smoking status: Current Every Day Smoker    Packs/day: 0.50    Years: 20.00    Pack years: 10.00  . Smokeless tobacco: Never Used  Substance and Sexual Activity  . Alcohol use: Yes    Alcohol/week: 4.0 standard drinks    Types: 4 Cans of beer per week  . Drug use: No  .  Sexual activity: Never  Lifestyle  . Physical activity:    Days per week: Not on file    Minutes per session: Not on file  . Stress: Not on file  Relationships  . Social connections:    Talks on phone: Not on file    Gets together: Not on file    Attends religious service: Not on file    Active member of club or organization: Not on file    Attends meetings of clubs or organizations: Not on file    Relationship status: Not on file  . Intimate partner violence:    Fear of current or ex partner: Not on file    Emotionally abused: Not on file    Physically abused: Not on file    Forced sexual activity: Not on file  Other Topics Concern  . Not on file  Social History Narrative  . Not on file      Current Outpatient Medications:  .  busPIRone (BUSPAR) 5 MG tablet, Take 1 tablet (5 mg total) by mouth 2 (two) times daily., Disp: 60 tablet, Rfl: 0 .  glucose blood (CONTOUR NEXT TEST) test strip, USE AS INSTRUCTED TO CHECK BLOOD GLUCOSE ONCE DAILY*VERIFY METER WITH PT, NO ANSWER 10/30*NOTCOVERED, Disp: 90 each, Rfl: 2 .  lisinopril (PRINIVIL,ZESTRIL) 5 MG tablet, Take 1 tablet (5 mg total) by mouth daily., Disp: 90 tablet, Rfl: 0 .  rosuvastatin (CRESTOR) 40 MG tablet, Take one tablet  at night, Disp: 90 tablet, Rfl: 0 .  hydrOXYzine (VISTARIL) 25 MG capsule, TAKE 1 CAPSULE (25 MG TOTAL) BY MOUTH 3 (THREE) TIMES DAILY AS NEEDED., Disp: , Rfl: 2 .  metFORMIN (GLUCOPHAGE) 500 MG tablet, Take 1 tablet (500 mg total) by mouth 2 (two) times daily with a meal for 18 days. Refill till appt, Disp: 180 tablet, Rfl: 0  No Known Allergies  ROS Constitutional: Negative for fever or weight change.  Respiratory: Positive for dry cough and negative shortness of breath.   Cardiovascular: Negative for chest pain or palpitations.  Gastrointestinal: Negative for abdominal pain, no bowel changes.  Musculoskeletal: Negative for gait problem or joint swelling.  Skin: Negative for rash.  Neurological: Negative for dizziness or headache.  No other specific complaints in a complete review of systems (except as listed in HPI above).  Objective  Vitals:   12/16/17 0756  BP: 118/76  Pulse: 93  Resp: 18  Temp: 98.1 F (36.7 C)  TempSrc: Oral  SpO2: 96%  Weight: 234 lb 1.6 oz (106.2 kg)  Height: 5\' 7"  (1.702 m)   Body mass index is 36.67 kg/m.  Physical Exam Constitutional: Patient appears well-developed and well-nourished. No distress.  HENT: Head: Normocephalic and atraumaticNose: Nose normal. Mouth/Throat: Oropharynx is clear and moist. No oropharyngeal exudate.   Eyes: Conjunctivae and EOM are normal. Pupils are equal, round, and reactive to light. No scleral icterus.  Neck: Normal range of  motion. Neck supple. No JVD present. Cardiovascular: Normal rate, regular rhythm and normal heart sounds.  No murmur heard. No BLE edema. Pulmonary/Chest: Effort normal and breath sounds normal. No respiratory distress. Musculoskeletal: Normal range of motion, no joint effusions. No gross deformities Neurological: he is alert and oriented to person, place, and time. No cranial nerve deficit. Coordination, balance, strength, speech and gait are normal.  Skin: Skin is warm and dry. No rash noted. No erythema.  Psychiatric: Patient has a normal mood and affect. behavior is normal. Judgment and thought content normal.  No results found for  this or any previous visit (from the past 72 hour(s)).   PHQ2/9: Depression screen [facility] 2/9 12/16/2017 11/15/2017 05/24/2017 04/21/2017 02/11/2017  Decreased Interest 1 1 0 0 0  Down, Depressed, Hopeless 0 0 0 0 0  PHQ - 2 Score 1 1 0 0 0  Altered sleeping 1 - - - -  Tired, decreased energy 1 - - - -  Change in appetite 0 - - - -  Feeling bad or failure about yourself  0 - - - -  Trouble concentrating 0 - - - -  Moving slowly or fidgety/restless 0 - - - -  Suicidal thoughts 0 - - - -  Difficult doing work/chores Not difficult at all - - - -    Fall Risk: Fall Risk  12/16/2017 11/15/2017 05/24/2017 04/21/2017 02/11/2017  Falls in the past year? No No No No No   Assessment & Plan  1. Anxiety - Anxiety is improving, but he is still using hydroxyzine at night daily for sleep, advised to take buspar BID instead of once daily and to try to decrease hydroxyzine use. - discussed relaxation techniques, sleep hygiene, and meditation. - busPIRone (BUSPAR) 5 MG tablet; Take 1 tablet (5 mg total) by mouth 2 (two) times daily.  Dispense: 180 tablet; Refill: 0 - Advised Wellbutrin can cause increased anxiety symptoms - if this occurs, he needs to stop medication and call our clinic.  2. Tobacco abuse counseling - Smoking cessation instruction/counseling given:   counseled patient on the dangers of tobacco use, advised patient to stop smoking, and reviewed strategies to maximize success for approximately 10 minutes - buPROPion (WELLBUTRIN XL) 150 MG 24 hr tablet; Take 1 tablet (150 mg total) by mouth daily.  Dispense: 90 tablet; Refill: 0  3. Class 2 severe obesity due to excess calories with serious comorbidity and body mass index (BMI) of 36.0 to 36.9 in adult [facility]) - Discussed importance of 150 minutes of physical activity weekly, eat two servings of fish weekly, eat one serving of tree nuts ( cashews, pistachios, pecans, almonds.[name]) every other day, eat 6 servings of fruit/vegetables daily and drink plenty of water and avoid sweet beverages.  - buPROPion (WELLBUTRIN XL) 150 MG 24 hr tablet; Take 1 tablet (150 mg total) by mouth daily.  Dispense: 90 tablet; Refill: 0  -Reviewed Health Maintenance: Declines flu shot and PNA vaccine

## 2017-12-16 NOTE — Patient Instructions (Addendum)
12 Ways to Curb Anxiety  ?Anxiety is normal human sensation. It is what helped our ancestors survive the pitfalls of the wilderness. Anxiety is defined as experiencing worry or nervousness about an imminent event or something with an uncertain outcome. It is a feeling experienced by most people at some point in their lives. Anxiety can be triggered by a very personal issue, such as the illness of a loved one, or an event of global proportions, such as a refugee crisis. Some of the symptoms of anxiety are:  Feeling restless.  Having a feeling of impending danger.  Increased heart rate.  Rapid breathing. Sweating.  Shaking.  Weakness or feeling tired.  Difficulty concentrating on anything except the current worry.  Insomnia.  Stomach or bowel problems. What can we do about anxiety we may be feeling? There are many techniques to help manage stress and relax. Here are 12 ways you can reduce your anxiety almost immediately: 1. Turn off the constant feed of information. Take a social media sabbatical. Studies have shown that social media directly contributes to social anxiety.  2. Monitor your television viewing habits. Are you watching shows that are also contributing to your anxiety, such as 24-hour news stations? Try watching something else, or better yet, nothing at all. Instead, listen to music, read an inspirational book or practice a hobby. 3. Eat nutritious meals. Also, don't skip meals and keep healthful snacks on hand. Hunger and poor diet contributes to feeling anxious. 4. Sleep. Sleeping on a regular schedule for at least seven to eight hours a night will do wonders for your outlook when you are awake. 5. Exercise. Regular exercise will help rid your body of that anxious energy and help you get more restful sleep. 6. Try deep (diaphragmatic) breathing. Inhale slowly through your nose for five seconds and exhale through your mouth. 7. Practice acceptance and gratitude. When anxiety hits,  accept that there are things out of your control that shouldn't be of immediate concern.  8. Seek out humor. When anxiety strikes, watch a funny video, read jokes or call a friend who makes you laugh. Laughter is healing for our bodies and releases endorphins that are calming. 9. Stay positive. Take the effort to replace negative thoughts with positive ones. Try to see a stressful situation in a positive light. Try to come up with solutions rather than dwelling on the problem. 10. Figure out what triggers your anxiety. Keep a journal and make note of anxious moments and the events surrounding them. This will help you identify triggers you can avoid or even eliminate. 11. Talk to someone. Let a trusted friend, family member or even trained professional know that you are feeling overwhelmed and anxious. Verbalize what you are feeling and why.  12. Volunteer. If your anxiety is triggered by a crisis on a large scale, become an advocate and work to resolve the problem that is causing you unease. Anxiety is often unwelcome and can become overwhelming. If not kept in check, it can become a disorder that could require medical treatment. However, if you take the time to care for yourself and avoid the triggers that make you anxious, you will be able to find moments of relaxation and clarity that make your life much more enjoyable.    Sleep Hygiene Tips 1) Get regular. One of the best ways to train your body to sleep well is to go to bed and get up at more or less the same time every day, even on  weekends and days off! This regular rhythm will make you feel better and will give your body something to work from. 2) Sleep when sleepy. Only try to sleep when you actually feel tired or sleepy, rather than spending too much time awake in bed. 3) Get up & try again. If you haven't been able to get to sleep after about 20 minutes or more, get up and do something calming or boring until you feel sleepy,  then return to bed and try again. Sit quietly on the couch with the lights off (bright light will tell your brain that it is time to wake up), or read something boring like the phone book. Avoid doing anything that is too stimulating or interesting, as this will wake you up even more. 4) Avoid caffeine & nicotine. It is best to avoid consuming any caffeine (in coffee, tea, cola drinks, chocolate, and some medications) or nicotine (cigarettes) for at least 4-6 hours before going to bed. These substances act as stimulants and interfere with the ability to fall asleep 5) Avoid alcohol. It is also best to avoid alcohol for at least 4-6 hours before going to bed. Many people believe that alcohol is relaxing and helps them to get to sleep at first, but it actually interrupts the quality of sleep. 6) Bed is for sleeping. Try not to use your bed for anything other than sleeping and sex, so that your body comes to associate bed with sleep. If you use bed as a place to watch TV, eat, read, work on your laptop, pay bills, and other things, your body will not learn this Connection. 7) No naps. It is best to avoid taking naps during the day, to make sure that you are tired at bedtime. If you can't make it through the day without a nap, make sure it is for less than an hour and before 3pm. 8) Sleep rituals. You can develop your own rituals of things to remind your body that it is time to sleep - some people find it useful to do relaxing stretches or breathing exercises for 15 minutes before bed each night, or sit calmly with a cup of caffeine-free tea. 9) Bathtime. Having a hot bath 1-2 hours before bedtime can be useful, as it will raise your body temperature, causing you to feel sleepy as your body temperature drops again. Research shows that sleepiness is associated with a drop in body temperature. 10) No clock-watching. Many people who struggle with sleep tend to watch the clock too  much. Frequently checking the clock during the night can wake you up (especially if you turn on the light to read the time) and reinforces negative thoughts such as "Oh no, look how late it is, I'll never get to sleep" or "it's so early, I have only slept for 5 hours, this is terrible." 11) Use a sleep diary. This worksheet can be a useful way of making sure you have the right facts about your sleep, rather than making assumptions. Because a diary involves watching the clock (see point 10) it is a good idea to only use it for two weeks to get an idea of what is going and then perhaps two months down the track to see how you are progressing. 12) Exercise. Regular exercise is a good idea to help with good sleep, but try not to do strenuous exercise in the 4 hours before bedtime. Morning walks are a great way to start the day feeling refreshed! 13) Eat right.  A healthy, balanced diet will help you to sleep well, but timing is important. Some people find that a very empty stomach at bedtime is distracting, so it can be useful to have a light snack, but a heavy meal soon before bed can also interrupt sleep. Some people recommend a warm glass of milk, which contains tryptophan, which acts as a natural sleep inducer. 14) The right space. It is very important that your bed and bedroom are quiet and comfortable for sleeping. A cooler room with enough blankets to stay warm is best, and make sure you have curtains or an eyemask to block out early morning light and earplugs if there is noise outside your room. 15) Keep daytime routine the same. Even if you have a bad night sleep and are tired it is important that you try to keep your daytime activities the same as you had planned. That is, don't avoid activities because you feel tired. This can reinforce the insomnia.   Take buspar TWICE a day - morning and night time. Take hydroxyzine as needed.  Please schedule your eye examination.

## 2018-01-09 DIAGNOSIS — H2000 Unspecified acute and subacute iridocyclitis: Secondary | ICD-10-CM | POA: Diagnosis not present

## 2018-01-09 DIAGNOSIS — E113293 Type 2 diabetes mellitus with mild nonproliferative diabetic retinopathy without macular edema, bilateral: Secondary | ICD-10-CM | POA: Diagnosis not present

## 2018-01-16 ENCOUNTER — Other Ambulatory Visit: Payer: Self-pay | Admitting: Ophthalmology

## 2018-01-16 ENCOUNTER — Other Ambulatory Visit
Admission: RE | Admit: 2018-01-16 | Discharge: 2018-01-16 | Disposition: A | Discharge status: Home / Self Care | Payer: BLUE CROSS/BLUE SHIELD | Source: Ambulatory Visit | Attending: Ophthalmology | Admitting: Ophthalmology

## 2018-01-16 DIAGNOSIS — E113293 Type 2 diabetes mellitus with mild nonproliferative diabetic retinopathy without macular edema, bilateral: Secondary | ICD-10-CM | POA: Diagnosis not present

## 2018-01-16 DIAGNOSIS — H2000 Unspecified acute and subacute iridocyclitis: Secondary | ICD-10-CM | POA: Diagnosis not present

## 2018-01-16 DIAGNOSIS — H3582 Retinal ischemia: Secondary | ICD-10-CM | POA: Diagnosis not present

## 2018-01-16 LAB — SEDIMENTATION RATE: Sed Rate: 59 mm/hr — ABNORMAL HIGH (ref 0–20)

## 2018-01-17 DIAGNOSIS — H3582 Retinal ischemia: Secondary | ICD-10-CM | POA: Diagnosis not present

## 2018-01-17 LAB — CBC WITH DIFFERENTIAL/PLATELET
Basophils Absolute: 0.1 10*3/uL (ref 0–0.1)
Basophils Relative: 1 %
Eosinophils Absolute: 0.3 10*3/uL (ref 0–0.7)
Eosinophils Relative: 3 %
HCT: 36.1 % — ABNORMAL LOW (ref 40.0–52.0)
Hemoglobin: 12.9 g/dL — ABNORMAL LOW (ref 13.0–18.0)
Lymphocytes Relative: 21 %
Lymphs Abs: 1.9 10*3/uL (ref 1.0–3.6)
MCH: 33.7 pg (ref 26.0–34.0)
MCHC: 35.9 g/dL (ref 32.0–36.0)
MCV: 94.1 fL (ref 80.0–100.0)
Monocytes Absolute: 0.7 10*3/uL (ref 0.2–1.0)
Monocytes Relative: 8 %
Neutro Abs: 5.9 10*3/uL (ref 1.4–6.5)
Neutrophils Relative %: 67 %
Platelets: 267 10*3/uL (ref 150–440)
RBC: 3.83 MIL/uL — ABNORMAL LOW (ref 4.40–5.90)
RDW: 12.7 % (ref 11.5–14.5)
WBC: 8.8 10*3/uL (ref 3.8–10.6)

## 2018-01-17 LAB — C-REACTIVE PROTEIN: CRP: 1.4 mg/dL — ABNORMAL HIGH (ref <1.0)

## 2018-01-20 ENCOUNTER — Ambulatory Visit
Admission: RE | Admit: 2018-01-20 | Discharge: 2018-01-20 | Disposition: A | Discharge status: Home / Self Care | Payer: BLUE CROSS/BLUE SHIELD | Source: Ambulatory Visit | Attending: Ophthalmology | Admitting: Ophthalmology

## 2018-01-20 DIAGNOSIS — I6523 Occlusion and stenosis of bilateral carotid arteries: Secondary | ICD-10-CM | POA: Diagnosis not present

## 2018-01-20 DIAGNOSIS — H3582 Retinal ischemia: Secondary | ICD-10-CM | POA: Diagnosis not present

## 2018-01-20 DIAGNOSIS — I6522 Occlusion and stenosis of left carotid artery: Secondary | ICD-10-CM | POA: Diagnosis not present

## 2018-01-23 ENCOUNTER — Ambulatory Visit (INDEPENDENT_AMBULATORY_CARE_PROVIDER_SITE_OTHER): Payer: BLUE CROSS/BLUE SHIELD | Admitting: Nurse Practitioner

## 2018-01-23 ENCOUNTER — Ambulatory Visit: Payer: BLUE CROSS/BLUE SHIELD | Admitting: Family Medicine

## 2018-01-23 ENCOUNTER — Encounter: Payer: Self-pay | Admitting: Emergency Medicine

## 2018-01-23 ENCOUNTER — Other Ambulatory Visit (INDEPENDENT_AMBULATORY_CARE_PROVIDER_SITE_OTHER): Payer: Self-pay | Admitting: Nurse Practitioner

## 2018-01-23 ENCOUNTER — Encounter (INDEPENDENT_AMBULATORY_CARE_PROVIDER_SITE_OTHER): Payer: Self-pay | Admitting: Nurse Practitioner

## 2018-01-23 ENCOUNTER — Ambulatory Visit
Admission: RE | Admit: 2018-01-23 | Discharge: 2018-01-23 | Disposition: A | Discharge status: Home / Self Care | Payer: BLUE CROSS/BLUE SHIELD | Source: Ambulatory Visit | Attending: Nurse Practitioner | Admitting: Nurse Practitioner

## 2018-01-23 ENCOUNTER — Telehealth (INDEPENDENT_AMBULATORY_CARE_PROVIDER_SITE_OTHER): Payer: Self-pay | Admitting: Nurse Practitioner

## 2018-01-23 ENCOUNTER — Encounter: Payer: Self-pay | Admitting: Family Medicine

## 2018-01-23 VITALS — BP 130/60 | HR 99 | Temp 98.7°F | Resp 16 | Ht 67.0 in | Wt 236.1 lb

## 2018-01-23 VITALS — BP 126/70 | HR 88 | Resp 16 | Ht 69.0 in | Wt 237.4 lb

## 2018-01-23 DIAGNOSIS — H5462 Unqualified visual loss, left eye, normal vision right eye: Secondary | ICD-10-CM

## 2018-01-23 DIAGNOSIS — I6522 Occlusion and stenosis of left carotid artery: Secondary | ICD-10-CM

## 2018-01-23 DIAGNOSIS — Z23 Encounter for immunization: Secondary | ICD-10-CM

## 2018-01-23 DIAGNOSIS — I6523 Occlusion and stenosis of bilateral carotid arteries: Secondary | ICD-10-CM | POA: Diagnosis not present

## 2018-01-23 DIAGNOSIS — E785 Hyperlipidemia, unspecified: Secondary | ICD-10-CM

## 2018-01-23 DIAGNOSIS — E7800 Pure hypercholesterolemia, unspecified: Secondary | ICD-10-CM

## 2018-01-23 DIAGNOSIS — E1169 Type 2 diabetes mellitus with other specified complication: Secondary | ICD-10-CM

## 2018-01-23 DIAGNOSIS — H3582 Retinal ischemia: Secondary | ICD-10-CM | POA: Diagnosis not present

## 2018-01-23 DIAGNOSIS — Z716 Tobacco abuse counseling: Secondary | ICD-10-CM | POA: Diagnosis not present

## 2018-01-23 DIAGNOSIS — I700 Atherosclerosis of aorta: Secondary | ICD-10-CM | POA: Insufficient documentation

## 2018-01-23 DIAGNOSIS — F17210 Nicotine dependence, cigarettes, uncomplicated: Secondary | ICD-10-CM

## 2018-01-23 HISTORY — DX: Type 2 diabetes mellitus without complications: E11.9

## 2018-01-23 LAB — POCT I-STAT CREATININE: Creatinine, Ser: 1 mg/dL (ref 0.61–1.24)

## 2018-01-23 MED ORDER — IOHEXOL 350 MG/ML SOLN
75.0000 mL | Freq: Once | INTRAVENOUS | Status: AC | PRN
  Administered 2018-01-23: 75 mL via INTRAVENOUS

## 2018-01-23 MED ORDER — NICOTINE 14 MG/24HR TD PT24: 14.0000 mg | MEDICATED_PATCH | Freq: Every day | TRANSDERMAL | 0 refills | Status: DC

## 2018-01-23 NOTE — Patient Instructions (Signed)
Coping with Quitting Smoking Quitting smoking is a physical and mental challenge. You will face cravings, withdrawal symptoms, and temptation. Before quitting, work with your health care provider to make a plan that can help you cope. Preparation can help you quit and keep you from giving in. How can I cope with cravings? Cravings usually last for 5-10 minutes. If you get through it, the craving will pass. Consider taking the following actions to help you cope with cravings:  Keep your mouth busy: ? Chew sugar-free gum. ? Suck on hard candies or a straw. ? Brush your teeth.  Keep your hands and body busy: ? Immediately change to a different activity when you feel a craving. ? Squeeze or play with a ball. ? Do an activity or a hobby, like making bead jewelry, practicing needlepoint, or working with wood. ? Mix up your normal routine. ? Take a short exercise break. Go for a quick walk or run up and down stairs. ? Spend time in public places where smoking is not allowed.  Focus on doing something kind or helpful for someone else.  Call a friend or family member to talk during a craving.  Join a support group.  Call a quit line, such as 1-800-QUIT-NOW.  Talk with your health care provider about medicines that might help you cope with cravings and make quitting easier for you.  How can I deal with withdrawal symptoms? Your body may experience negative effects as it tries to get used to not having nicotine in the system. These effects are called withdrawal symptoms. They may include:  Feeling hungrier than normal.  Trouble concentrating.  Irritability.  Trouble sleeping.  Feeling depressed.  Restlessness and agitation.  Craving a cigarette.  To manage withdrawal symptoms:  Avoid places, people, and activities that trigger your cravings.  Remember why you want to quit.  Get plenty of sleep.  Avoid coffee and other caffeinated drinks. These may worsen some of your  symptoms.  How can I handle social situations? Social situations can be difficult when you are quitting smoking, especially in the first few weeks. To manage this, you can:  Avoid parties, bars, and other social situations where people might be smoking.  Avoid alcohol.  Leave right away if you have the urge to smoke.  Explain to your family and friends that you are quitting smoking. Ask for understanding and support.  Plan activities with friends or family where smoking is not an option.  What are some ways I can cope with stress? Wanting to smoke may cause stress, and stress can make you want to smoke. Find ways to manage your stress. Relaxation techniques can help. For example:  Breathe slowly and deeply, in through your nose and out through your mouth.  Listen to soothing, relaxing music.  Talk with a family member or friend about your stress.  Light a candle.  Soak in a bath or take a shower.  Think about a peaceful place.  What are some ways I can prevent weight gain? Be aware that many people gain weight after they quit smoking. However, not everyone does. To keep from gaining weight, have a plan in place before you quit and stick to the plan after you quit. Your plan should include:  Having healthy snacks. When you have a craving, it may help to: ? Eat plain popcorn, crunchy carrots, celery, or other cut vegetables. ? Chew sugar-free gum.  Changing how you eat: ? Eat small portion sizes at meals. ?   Eat 4-6 small meals throughout the day instead of 1-2 large meals a day. ? Be mindful when you eat. Do not watch television or do other things that might distract you as you eat.  Exercising regularly: ? Make time to exercise each day. If you do not have time for a long workout, do short bouts of exercise for 5-10 minutes several times a day. ? Do some form of strengthening exercise, like weight lifting, and some form of aerobic exercise, like running or  swimming.  Drinking plenty of water or other low-calorie or no-calorie drinks. Drink 6-8 glasses of water daily, or as much as instructed by your health care provider.  Summary  Quitting smoking is a physical and mental challenge. You will face cravings, withdrawal symptoms, and temptation to smoke again. Preparation can help you as you go through these challenges.  You can cope with cravings by keeping your mouth busy (such as by chewing gum), keeping your body and hands busy, and making calls to family, friends, or a helpline for people who want to quit smoking.  You can cope with withdrawal symptoms by avoiding places where people smoke, avoiding drinks with caffeine, and getting plenty of rest.  Ask your health care provider about the different ways to prevent weight gain, avoid stress, and handle social situations. This information is not intended to replace advice given to you by your health care provider. Make sure you discuss any questions you have with your health care provider. Document Released: 04/09/2016 Document Revised: 04/09/2016 Document Reviewed: 04/09/2016 Elsevier Interactive Patient Education  2018 [employer]  

## 2018-01-23 NOTE — Telephone Encounter (Signed)
Currently the patient does not have Daniel Snyder listed as someone who is able to receive medical information regarding his care and diagnosis.  And have the patient will like for [place] to speak directly to her he would need to contact us and let us know.  Otherwise, Mr. Blankenship can share his medical information directly with her once he follows up with [place].

## 2018-01-23 NOTE — Progress Notes (Signed)
Name: Daniel Snyder   MRN: [id number]    DOB: Dec 13, 1955   Date:01/23/2018       Progress Note  Subjective  Chief Complaint  Chief Complaint  Patient presents with  . Results    evaluated by eye doctor on Friday. Unable to see out of left eye. Has concerns about results from eye u/s.    HPI  Pt presents to discussed Carotid US results.  LEFT eye initially was itchy and felt dry, then he lost vision in the eye completely 2 weeks ago.  He went to see [facility] (Dr. Inez Pilgrim). Tuesday had injection into his eye. Friday he had Carotid US. He had Carotid US done Friday 01/20/2018 and it showed the following:  IMPRESSION: 1. Age-indeterminate, though presumably chronic, occlusion of the left internal carotid artery. 2. Minimal amount of right-sided atherosclerotic plaque, not resulting in elevated peak systolic velocities within the right internal carotid artery to suggest a hemodynamically significant stenosis, though note, velocity measurements are unreliable in the setting of a contralateral occlusion. Further evaluation with CTA could be performed as clinically indicated. 3. Antegrade flow demonstrated within the bilateral vertebral arteries. This was made a call report. Electronically Signed   By: Simonne Come M.D.   On: 01/20/2018 16:27  - Seeing Dr. Inez Pilgrim with [facility] over the last 2 weeks.  Unable to view Dr. Skip Estimable notes, however labs are available, and on 01/16/2018 the Sed rate and CRP were both elevated, and CBC showed anemia.  He is taking 60mg  Prednisone daily. - He describes 3 weeks of intermittent weakened grip of bilateral hands (worse on the right); also notes some weak feeling in BLE.  Otherwise he denies numbness/tingling, facial droop, slurred speech, confusion, or abnormal gait. - Taking crestor 40mg  daily; smoking 4 cigarettes a day as of yesterday (was smoking 1ppd before this). Last LDL was below goal. - Has controlled T2DM.  Patient  Active Problem List   Diagnosis Date Noted  . Class 2 severe obesity due to excess calories with serious comorbidity and body mass index (BMI) of 36.0 to 36.9 in adult [facility]) 12/16/2017  . Tobacco abuse counseling 12/16/2017  . Hyperlipidemia 08/11/2016  . Vitamin D deficiency 08/11/2016  . Annual physical exam 05/05/2016  . Diabetes mellitus type 2, uncontrolled (HCC) 05/05/2016  . Paronychia of finger of right hand 03/09/2016  . Osteoarthritis of knee 09/08/2015  . Olecranon bursitis 09/08/2015  . Swelling of right elbow joint 08/27/2015  . Right knee pain 08/27/2015  . Numbness in feet 08/27/2015  . Anxiety 08/27/2015  . Hx of colonic polyps   . Rectal polyp     Past Surgical History:  Procedure Laterality Date  . COLONOSCOPY N/A 10/10/2014   Procedure: COLONOSCOPY;  Surgeon: [name], MD;  Location: [facility];  Service: Gastroenterology;  Laterality: N/A;  PT WANTS EARLY  . POLYPECTOMY  10/10/2014   Procedure: POLYPECTOMY;  Surgeon: [name], MD;  Location: [facility];  Service: Gastroenterology;;    Family History  Problem Relation Age of Onset  . COPD Mother   . Cancer Father   . Emphysema Father   . Diabetes Sister   . Heart attack Brother   . Heart disease Paternal Grandfather   . Heart attack Paternal Grandfather     Social History   Socioeconomic History  . Marital status: Married    Spouse name: Not on file  . Number of children: Not on file  . Years of education: Not  on file  . Highest education level: Not on file  Occupational History  . Not on file  Social Needs  . Financial resource strain: Not on file  . Food insecurity:    Worry: Not on file    Inability: Not on file  . Transportation needs:    Medical: Not on file    Non-medical: Not on file  Tobacco Use  . Smoking status: Current Every Day Smoker    Packs/day: 0.50    Years: 20.00    Pack years: 10.00  . Smokeless tobacco: Never Used  Substance and Sexual Activity    . Alcohol use: Yes    Alcohol/week: 4.0 standard drinks    Types: 4 Cans of beer per week  . Drug use: No  . Sexual activity: Never  Lifestyle  . Physical activity:    Days per week: Not on file    Minutes per session: Not on file  . Stress: Not on file  Relationships  . Social connections:    Talks on phone: Not on file    Gets together: Not on file    Attends religious service: Not on file    Active member of club or organization: Not on file    Attends meetings of clubs or organizations: Not on file    Relationship status: Not on file  . Intimate partner violence:    Fear of current or ex partner: Not on file    Emotionally abused: Not on file    Physically abused: Not on file    Forced sexual activity: Not on file  Other Topics Concern  . Not on file  Social History Narrative  . Not on file     Current Outpatient Medications:  .  buPROPion (WELLBUTRIN XL) 150 MG 24 hr tablet, Take 1 tablet (150 mg total) by mouth daily., Disp: 90 tablet, Rfl: 0 .  busPIRone (BUSPAR) 5 MG tablet, Take 1 tablet (5 mg total) by mouth 2 (two) times daily., Disp: 180 tablet, Rfl: 0 .  DUREZOL 0.05 % EMUL, TAKE 1 DROP(S) IN LEFT EYE EVERY 2 HOURS, Disp: , Rfl: 2 .  glucose blood (CONTOUR NEXT TEST) test strip, USE AS INSTRUCTED TO CHECK BLOOD GLUCOSE ONCE DAILY*VERIFY METER WITH PT, NO ANSWER 10/30*NOTCOVERED, Disp: 90 each, Rfl: 2 .  hydrOXYzine (VISTARIL) 25 MG capsule, TAKE 1 CAPSULE (25 MG TOTAL) BY MOUTH 3 (THREE) TIMES DAILY AS NEEDED., Disp: 45 capsule, Rfl: 0 .  lisinopril (PRINIVIL,ZESTRIL) 5 MG tablet, Take 1 tablet (5 mg total) by mouth daily., Disp: 90 tablet, Rfl: 0 .  predniSONE (DELTASONE) 20 MG tablet, TAKE 3 TABLET(S) BY MOUTH ONCE A DAY, Disp: , Rfl: 1 .  rosuvastatin (CRESTOR) 40 MG tablet, Take one tablet  at night, Disp: 90 tablet, Rfl: 0 .  metFORMIN (GLUCOPHAGE) 500 MG tablet, Take 1 tablet (500 mg total) by mouth 2 (two) times daily with a meal for 18 days. Refill till  appt, Disp: 180 tablet, Rfl: 0  No Known Allergies  I personally reviewed active problem list, medication list, allergies, social history, health maintenance, notes from last encounter, lab results, and imaging with the patient/caregiver today.   ROS Ten systems reviewed and is negative except as mentioned in HPI  Objective  Vitals:   01/23/18 0802  BP: 130/60  Pulse: 99  Resp: 16  Temp: 98.7 F (37.1 C)  TempSrc: Oral  SpO2: 98%  Weight: 236 lb 1.6 oz (107.1 kg)  Height: 5\' 7"  (1.702 m)  Body mass index is 36.98 kg/m.  Physical Exam  Constitutional: Patient appears well-developed and well-nourished. No distress.  HENT: Head: Normocephalic and atraumatic.  Nose: Nose normal. Mouth/Throat: Oropharynx is clear and moist. No oropharyngeal exudate or tonsillar swelling.  Eyes: Conjunctivae and EOM are normal. No scleral icterus.  RIGHT pupil is ERRLA; LEFT pupil is fixed and dilated. Neck: Normal range of motion. Neck supple. No JVD present. No thyromegaly present.  Cardiovascular: Normal rate, regular rhythm and normal heart sounds.  No murmur heard. No BLE edema Pulmonary/Chest: Effort normal and breath sounds normal. No respiratory distress. Musculoskeletal: Normal range of motion, no joint effusions. No gross deformities Neurological: Pt is alert and oriented to person, place, and time. No cranial nerve deficit aside from LEFT eye blindness. Coordination, balance, strength, speech and gait are normal.  Skin: Skin is warm and dry. No rash noted. No erythema.  Psychiatric: Patient has a normal mood and affect. behavior is normal. Judgment and thought content normal.  No results found for this or any previous visit (from the past 72 hour(s)).  PHQ2/9: Depression screen [facility] 2/9 12/16/2017 11/15/2017 05/24/2017 04/21/2017 02/11/2017  Decreased Interest 1 1 0 0 0  Down, Depressed, Hopeless 0 0 0 0 0  PHQ - 2 Score 1 1 0 0 0  Altered sleeping 1 - - - -  Tired, decreased energy  1 - - - -  Change in appetite 0 - - - -  Feeling bad or failure about yourself  0 - - - -  Trouble concentrating 0 - - - -  Moving slowly or fidgety/restless 0 - - - -  Suicidal thoughts 0 - - - -  Difficult doing work/chores Not difficult at all - - - -   Fall Risk: Fall Risk  01/23/2018 12/16/2017 11/15/2017 05/24/2017 04/21/2017  Falls in the past year? Yes No No No No  Number falls in past yr: 2 or more - - - -  Injury with Fall? No - - - -  Risk for fall due to : Impaired vision - - - -   Functional Status Survey: Is the patient deaf or have difficulty hearing?: No Does the patient have difficulty seeing, even when wearing glasses/contacts?: Yes Does the patient have difficulty concentrating, remembering, or making decisions?: No Does the patient have difficulty walking or climbing stairs?: No Does the patient have difficulty dressing or bathing?: No Does the patient have difficulty doing errands alone such as visiting a doctor's office or shopping?: No  Assessment & Plan  1. Left carotid artery occlusion - Last LDL was above goal at 85 (he is diabetic, discussed goal bein <70); continue crestor 40mg  (this was incraesed to 40mg  in July 2019 after he was found to not be at goal) - BP is controlled today at 130/60 - continue lisinopril daily 2. Vision loss of left eye - Spoke directly with Dr. Inez Pilgrim with [facility] - he advises to STOP prednisone as this does not appear to be Giant Cell Arteritis - this is relayed to the patient.  He also states he spoke with Dr. Lorretta Harp Friday 01/20/2018 and that his office should be reaching out to schedule the pt today.  Our office reached out to [facility], and he is now scheduled to see Sheppard Plumber at 1pm today.  Signs and symptoms of stroke are discussed in detail, and he verbalizes understanding of when to present for emergency care.    3. Flu vaccine need - Flu  Vaccine QUAD 36+ mos IM  4. Tobacco abuse counseling -  Smoking cessation instruction/counseling given:  commended patient for quitting and reviewed strategies for preventing relapses  - Continue Wellbutrin. - Patches ordered - I spent approximately 5 minutes discussing smoking cessation with the patient.  He is down to 4 cigarettes a day, encouraged him to quit completely. - nicotine (NICODERM CQ - DOSED IN MG/24 HOURS) 14 mg/24hr patch; Place 1 patch (14 mg total) onto the skin daily.  Dispense: 28 patch; Refill: 0  5. Type 2 diabetes mellitus with hyperlipidemia (HCC) - Last A1C was below goal; taking lisinopril; taking statin medication

## 2018-01-23 NOTE — Telephone Encounter (Signed)
I read the note, and I can see where there has been talks about a CT angio of the head to rule out the Optic artery. Please advise, or call the patient's daughter-in-law back.

## 2018-01-23 NOTE — Telephone Encounter (Signed)
Daniel Snyder is going to call the patient and inform them that she is getting the authorization for him to have the needed procedure, and that they can move forward with everything once the authorization.

## 2018-01-23 NOTE — Progress Notes (Signed)
Subjective:    Patient ID: Daniel Snyder, male    DOB: 1956-02-11, 62 y.o.   MRN: [id number] Chief Complaint  Patient presents with  . New Patient (Initial Visit)    ref Radene Knee for carotid stenosis and blurred vision    HPI  Daniel Snyder is a 62 y.o. male referred to [place] from Dr. Inez Pilgrim from [facility].  Patient states that several weeks ago he began to have a very itchy left eye which progressively became worse.  While it was itchy he would rub the eye and it would have momentary issues of blurred vision, which would go away.  He states that during this time his eye became very swollen and puffy.  He states that the symptoms of blurriness became more consistent until now it is constant.  The patient states that when looking from solely his left eye it is blurry and he is only able to make out shapes and colors.  He states that he has normal peripheral vision however.  The patient underwent a funduscopic examination which revealed poor arterial flow to the left eye.  As per notes from Dr. Sharman Crate, it was noted that the patient has possible ocular ischemic syndrome.  Previously it was thought that the patient had giant cell arteritis, however after a trial of prednisone there has been no change in the patient's status.  The patient currently has asymmetrical pupils, which she states has been consistent for some time.  The patient also reports a throbbing sensation.  And part of the work-up to determine causes the patient underwent a carotid ultrasound, which revealed a possible left ICA occlusion.  There are elevated velocities present in the right internal carotid artery.  The patient denies any fever, chills, nausea, vomiting, diarrhea.  The patient denies any chest pain or shortness of breath.  The patient denies any claudication-like symptoms.  Review of Systems   Review of Systems: Negative Unless Checked Constitutional: [] Weight loss  [] Fever  [] Chills Cardiac:  [] Chest pain   ? Atrial Fibrillation  [] Palpitations   [] Shortness of breath when laying flat   [] Shortness of breath with exertion. Vascular:  [] Pain in legs with walking   [] Pain in legs with standing  [] History of DVT   [] Phlebitis   [] Swelling in legs   [] Varicose veins   [] Non-healing ulcers Pulmonary:   [] Uses home oxygen   [] Productive cough   [] Hemoptysis   [] Wheeze  [] COPD   [] Asthma Neurologic:  [] Dizziness   [] Seizures   [] History of stroke   [] History of TIA  [] Aphasia   [x] Visual changes   [] Weakness or numbness in arm   [] Weakness or numbness in leg Musculoskeletal:   [] Joint swelling   [] Joint pain   [] Low back pain  ? History of Knee Replacement Hematologic:  [] Easy bruising  [] Easy bleeding   [] Hypercoagulable state   [] Anemic Gastrointestinal:  [] Diarrhea   [] Vomiting  [] Gastroesophageal reflux/heartburn   [] Difficulty swallowing. Genitourinary:  [] Chronic kidney disease   [] Difficult urination  [] Anuric   [] Blood in urine Skin:  [] Rashes   [] Ulcers  Psychological:  [] History of anxiety   []  History of major depression  ? Memory Difficulties     Objective:   Physical Exam  BP 126/70 (BP Location: Right Arm)   Pulse 88   Resp 16   Ht 5\' 9"  (1.753 m)   Wt 237 lb 6.4 oz (107.7 kg)   BMI 35.06 kg/m   Past Medical History:  Diagnosis Date  .  Anxiety   . Arthritis    fingers     Gen: WD/WN, NAD, appears older than stated age Head: [place]/AT, No temporalis wasting.  Pupils asymmetrical, left is nonreactive Ear/Nose/Throat: Hearing grossly intact, nares w/o erythema or drainage Eyes: PER, EOMI, sclera nonicteric.  Neck: Supple, no masses.  No JVD.  Pulmonary:  Good air movement, no use of accessory muscles.  Cardiac: RRR Vascular:  No carotid bruits auscultated. Vessel Right Left  Radial Palpable Palpable  Dorsalis Pedis Palpable Palpable  Posterior Tibial Palpable Palpable   Gastrointestinal: soft, non-distended. No guarding/no peritoneal signs.  Musculoskeletal:  M/S 5/5 throughout.  No deformity or atrophy.  Neurologic: Pain and light touch intact in extremities.  Symmetrical.  Speech is fluent. Motor exam as listed above. Psychiatric: Judgment intact, Mood & affect appropriate for pt's clinical situation. Dermatologic: No Venous rashes. No Ulcers Noted.  No changes consistent with cellulitis. Lymph : No Cervical lymphadenopathy, no lichenification or skin changes of chronic lymphedema.   Social History   Socioeconomic History  . Marital status: Married    Spouse name: Not on file  . Number of children: Not on file  . Years of education: Not on file  . Highest education level: Not on file  Occupational History  . Not on file  Social Needs  . Financial resource strain: Not on file  . Food insecurity:    Worry: Not on file    Inability: Not on file  . Transportation needs:    Medical: Not on file    Non-medical: Not on file  Tobacco Use  . Smoking status: Current Every Day Smoker    Packs/day: 0.50    Years: 20.00    Pack years: 10.00  . Smokeless tobacco: Never Used  Substance and Sexual Activity  . Alcohol use: Yes    Alcohol/week: 4.0 standard drinks    Types: 4 Cans of beer per week  . Drug use: No  . Sexual activity: Never  Lifestyle  . Physical activity:    Days per week: Not on file    Minutes per session: Not on file  . Stress: Not on file  Relationships  . Social connections:    Talks on phone: Not on file    Gets together: Not on file    Attends religious service: Not on file    Active member of club or organization: Not on file    Attends meetings of clubs or organizations: Not on file    Relationship status: Not on file  . Intimate partner violence:    Fear of current or ex partner: Not on file    Emotionally abused: Not on file    Physically abused: Not on file    Forced sexual activity: Not on file  Other Topics Concern  . Not on file  Social History Narrative  . Not on file    Past Surgical History:    Procedure Laterality Date  . COLONOSCOPY N/A 10/10/2014   Procedure: COLONOSCOPY;  Surgeon: [name], MD;  Location: [facility];  Service: Gastroenterology;  Laterality: N/A;  PT WANTS EARLY  . POLYPECTOMY  10/10/2014   Procedure: POLYPECTOMY;  Surgeon: [name], MD;  Location: [facility];  Service: Gastroenterology;;    Family History  Problem Relation Age of Onset  . COPD Mother   . Cancer Father   . Emphysema Father   . Diabetes Sister   . Heart attack Brother   . Heart disease Paternal Grandfather   .  Heart attack Paternal Grandfather     No Known Allergies     Assessment & Plan:  1. Carotid artery stenosis, symptomatic, left The carotid ultrasound suggest a occlusion of the left carotid artery, however per the results a CTA is recommended to rule out if there is total occlusion or if there is high-grade stenosis present on the right carotid artery.  Patient should undergo CT angiography of the carotid arteries to define the degree of stenosis of the internal carotid arteries bilaterally and the anatomic suitability for surgery vs. intervention.  If the patient does indeed need surgery cardiac clearance will be required, once cleared the patient will be scheduled for surgery.  The risks, benefits and alternative therapies were reviewed in detail with the patient.  All questions were answered.  The patient agrees to proceed with imaging.  Continue antiplatelet therapy as prescribed. Continue management of CAD, HTN and Hyperlipidemia. Healthy heart diet, encouraged exercise at least 4 times per week.   - CT Angio Neck W/Cm &/Or Wo/Cm; Future  2. Ocular ischemic syndrome Currently the patient has been dealing with this issue for several weeks.  It has left him unable to work and it is also conflicting with daily activities.  It is also affecting things such as his balance.  CT angios of the head is also warranted to determine if there is an embolic event  causing his symptomology, or if the possible left carotid occlusion is extending further into the head possibly causing obstruction near the area of the optic artery.  CT angios of the head is necessary for further decision-making.  - CT ANGIO HEAD W OR WO CONTRAST; Future  3. Type 2 diabetes mellitus with hyperlipidemia (HCC) Continue hypoglycemic medications as already ordered, these medications have been reviewed and there are no changes at this time.  Hgb A1C to be monitored as already arranged by primary service   4. Pure hypercholesterolemia Continue statin as ordered and reviewed, no changes at this time     Current Outpatient Medications on File Prior to Visit  Medication Sig Dispense Refill  . buPROPion (WELLBUTRIN XL) 150 MG 24 hr tablet Take 1 tablet (150 mg total) by mouth daily. 90 tablet 0  . busPIRone (BUSPAR) 5 MG tablet Take 1 tablet (5 mg total) by mouth 2 (two) times daily. 180 tablet 0  . DUREZOL 0.05 % EMUL TAKE 1 DROP(S) IN LEFT EYE EVERY 2 HOURS  2  . glucose blood (CONTOUR NEXT TEST) test strip USE AS INSTRUCTED TO CHECK BLOOD GLUCOSE ONCE DAILY*VERIFY METER WITH PT, NO ANSWER 10/30*NOTCOVERED 90 each 2  . hydrOXYzine (VISTARIL) 25 MG capsule TAKE 1 CAPSULE (25 MG TOTAL) BY MOUTH 3 (THREE) TIMES DAILY AS NEEDED. 45 capsule 0  . lisinopril (PRINIVIL,ZESTRIL) 5 MG tablet Take 1 tablet (5 mg total) by mouth daily. 90 tablet 0  . metFORMIN (GLUCOPHAGE) 500 MG tablet Take 1 tablet (500 mg total) by mouth 2 (two) times daily with a meal for 18 days. Refill till appt 180 tablet 0  . nicotine (NICODERM CQ - DOSED IN MG/24 HOURS) 14 mg/24hr patch Place 1 patch (14 mg total) onto the skin daily. 28 patch 0  . rosuvastatin (CRESTOR) 40 MG tablet Take one tablet  at night 90 tablet 0   No current facility-administered medications on file prior to visit.     There are no Patient Instructions on file for this visit. No follow-ups on file.   Georgiana Spinner, NP

## 2018-01-26 ENCOUNTER — Other Ambulatory Visit: Payer: Self-pay | Admitting: Nurse Practitioner

## 2018-01-26 ENCOUNTER — Encounter (INDEPENDENT_AMBULATORY_CARE_PROVIDER_SITE_OTHER): Payer: Self-pay | Admitting: Vascular Surgery

## 2018-01-26 ENCOUNTER — Ambulatory Visit (INDEPENDENT_AMBULATORY_CARE_PROVIDER_SITE_OTHER): Payer: BLUE CROSS/BLUE SHIELD | Admitting: Vascular Surgery

## 2018-01-26 VITALS — BP 147/78 | HR 74 | Resp 16 | Ht 74.0 in | Wt 236.0 lb

## 2018-01-26 DIAGNOSIS — E785 Hyperlipidemia, unspecified: Secondary | ICD-10-CM

## 2018-01-26 DIAGNOSIS — E1169 Type 2 diabetes mellitus with other specified complication: Secondary | ICD-10-CM

## 2018-01-26 DIAGNOSIS — I6522 Occlusion and stenosis of left carotid artery: Secondary | ICD-10-CM | POA: Diagnosis not present

## 2018-01-26 DIAGNOSIS — M1710 Unilateral primary osteoarthritis, unspecified knee: Secondary | ICD-10-CM

## 2018-01-26 DIAGNOSIS — F419 Anxiety disorder, unspecified: Secondary | ICD-10-CM

## 2018-01-26 DIAGNOSIS — E7800 Pure hypercholesterolemia, unspecified: Secondary | ICD-10-CM

## 2018-01-26 DIAGNOSIS — F17210 Nicotine dependence, cigarettes, uncomplicated: Secondary | ICD-10-CM

## 2018-01-26 NOTE — Progress Notes (Signed)
MRN : [id number]  Daniel Snyder is a 62 y.o. (03-27-1956) male who presents with chief complaint of No chief complaint on file. Daniel Snyder  History of Present Illness:   The patient is seen for follow up evaluation of carotid stenosis status post CT angiogram. CT scan was done 01/23/2018 . Patient reports that the test went well with no problems or complications.   The patient denies interval amaurosis fugax but notes his left eye vision has not improved. There is no recent or interval TIA symptoms or focal motor deficits. There is no prior documented CVA.  The patient not taking enteric-coated aspirin 81 mg or Plavix 75 mg daily.  There is no history of migraine headaches. There is no history of seizures.  The patient has a history of coronary artery disease, no recent episodes of angina or shortness of breath. The patient denies PAD or claudication symptoms. There is a history of hyperlipidemia which is being treated with a statin.    CT angiogram is reviewed by me personally and shows occlusion of the LICA and high grade right siphon disease.  Right proximal ICA shows <50%  No outpatient medications have been marked as taking for the 01/26/18 encounter (Appointment) with Daniel Snyder, Daniel Craver, MD.    Past Medical History:  Diagnosis Date  . Anxiety   . Arthritis    fingers  . Diabetes mellitus without complication [facility])     Past Surgical History:  Procedure Laterality Date  . COLONOSCOPY N/A 10/10/2014   Procedure: COLONOSCOPY;  Surgeon: Daniel Minium, MD;  Location: [facility];  Service: Gastroenterology;  Laterality: N/A;  PT WANTS EARLY  . POLYPECTOMY  10/10/2014   Procedure: POLYPECTOMY;  Surgeon: Daniel Minium, MD;  Location: [facility];  Service: Gastroenterology;;    Social History Social History   Tobacco Use  . Smoking status: Current Every Day Smoker    Packs/day: 0.50    Years: 20.00    Pack years: 10.00  . Smokeless tobacco: Never Used  Substance  Use Topics  . Alcohol use: Yes    Alcohol/week: 4.0 standard drinks    Types: 4 Cans of beer per week  . Drug use: No    Family History Family History  Problem Relation Age of Onset  . COPD Mother   . Cancer Father   . Emphysema Father   . Diabetes Sister   . Heart attack Brother   . Heart disease Paternal Grandfather   . Heart attack Paternal Grandfather     No Known Allergies   REVIEW OF SYSTEMS (Negative unless checked)  Constitutional: [] Weight loss  [] Fever  [] Chills Cardiac: [] Chest pain   [] Chest pressure   [] Palpitations   [] Shortness of breath when laying flat   [] Shortness of breath with exertion. Vascular:  [] Pain in legs with walking   [] Pain in legs at rest  [] History of DVT   [] Phlebitis   [] Swelling in legs   [] Varicose veins   [] Non-healing ulcers Pulmonary:   [] Uses home oxygen   [] Productive cough   [] Hemoptysis   [] Wheeze  [] COPD   [] Asthma Neurologic:  [] Dizziness   [] Seizures   [] History of stroke   [] History of TIA  [] Aphasia   [] Vissual changes   [] Weakness or numbness in arm   [] Weakness or numbness in leg Musculoskeletal:   [] Joint swelling   [] Joint pain   [] Low back pain Hematologic:  [] Easy bruising  [] Easy bleeding   [] Hypercoagulable state   [] Anemic Gastrointestinal:  [] Diarrhea   []   Vomiting  [] Gastroesophageal reflux/heartburn   [] Difficulty swallowing. Genitourinary:  [] Chronic kidney disease   [] Difficult urination  [] Frequent urination   [] Blood in urine Skin:  [] Rashes   [] Ulcers  Psychological:  [] History of anxiety   []  History of major depression.  Physical Examination  There were no vitals filed for this visit. There is no height or weight on file to calculate BMI. Gen: WD/WN, NAD Head: [place]/AT, No temporalis wasting.  Ear/Nose/Throat: Hearing grossly intact, nares w/o erythema or drainage Eyes: PER, EOMI, sclera nonicteric.  Neck: Supple, no large masses.   Pulmonary:  Good air movement, no audible wheezing bilaterally, no use of  accessory muscles.  Cardiac: RRR, no JVD Vascular:  Left carotid bruit Vessel Right Left  Radial Palpable Palpable  Brachial Palpable Palpable  Carotid Palpable Palpable  Gastrointestinal: Non-distended. No guarding/no peritoneal signs.  Musculoskeletal: M/S 5/5 throughout.  No deformity or atrophy.  Neurologic: CN 2-12 intact. Symmetrical.  Speech is fluent. Motor exam as listed above. Psychiatric: Judgment intact, Mood & affect appropriate for pt's clinical situation. Dermatologic: No rashes or ulcers noted.  No changes consistent with cellulitis. Lymph : No lichenification or skin changes of chronic lymphedema.  CBC Lab Results  Component Value Date   WBC 8.8 01/16/2018   HGB 12.9 (L) 01/16/2018   HCT 36.1 (L) 01/16/2018   MCV 94.1 01/16/2018   PLT 267 01/16/2018    BMET    Component Value Date/Time   NA 138 08/16/2017 0928   K 4.9 08/16/2017 0928   CL 105 08/16/2017 0928   CO2 27 08/16/2017 0928   GLUCOSE 159 (H) 08/16/2017 0928   BUN 16 08/16/2017 0928   CREATININE 1.00 01/23/2018 1624   CREATININE 0.81 08/16/2017 0928   CALCIUM 9.6 08/16/2017 0928   GFRNONAA 97 08/16/2017 0928   GFRAA 113 08/16/2017 0928   Estimated Creatinine Clearance: 95 mL/min (by C-G formula based on SCr of 1 mg/dL).  COAG No results found for: INR, PROTIME  Radiology Ct Angio Head W Or Wo Contrast  Addendum Date: 01/23/2018   ADDENDUM REPORT: 01/23/2018 17:10 ADDENDUM: Study discussed by telephone with Dr. Festus Barren on 01/23/2018 at 1705 hours. Electronically Signed   By: Odessa Fleming M.D.   On: 01/23/2018 17:10   Result Date: 01/23/2018 CLINICAL DATA:  62 year old male with 3 weeks of left eye vision loss. Carotid stenosis. Ocular ischemic syndrome. EXAM: CT ANGIOGRAPHY HEAD AND NECK TECHNIQUE: Multidetector CT imaging of the head and neck was performed using the standard protocol during bolus administration of intravenous contrast. Multiplanar CT image reconstructions and MIPs were obtained to  evaluate the vascular anatomy. Carotid stenosis measurements (when applicable) are obtained utilizing NASCET criteria, using the distal internal carotid diameter as the denominator. CONTRAST:  75mL OMNIPAQUE IOHEXOL 350 MG/ML SOLN COMPARISON:  None. FINDINGS: CT HEAD Brain: No midline shift, ventriculomegaly, mass effect, evidence of mass lesion, intracranial hemorrhage or evidence of cortically based acute infarction. No cortical encephalomalacia identified. Mild for age cerebral white matter hypodensity, otherwise gray-white matter differentiation is within normal limits. Calvarium and skull base: Negative. Paranasal sinuses: Clear. Orbits: The orbits soft tissues appear symmetric and normal. Visualized scalp soft tissues are within normal limits. CTA NECK Skeleton: Carious posterior right mandible dentition. No acute osseous abnormality identified. Upper chest: Possible upper lobe centrilobular emphysema. No superior mediastinal lymphadenopathy. Other neck: Negative; no neck mass or lymphadenopathy. Aortic arch: 3 vessel arch configuration. Mild to moderate arch atherosclerosis with soft and calcified plaque. Right carotid system: No brachiocephalic  or right CCA origin stenosis. Soft and calcified plaque at the right ICA origin and bulb resulting in less than 50 % stenosis with respect to the distal vessel. Left carotid system: No left CCA origin stenosis. Patent left CCA to the bifurcation. However, the left ICA is occluded in the bulb just beyond the origin see series 14, image 24. No reconstituted enhancement in the cervical left ICA. Vertebral arteries: No significant proximal right subclavian artery stenosis despite origin plaque. Normal right vertebral artery origin. The right vertebral artery is dominant and patent to the skull base without stenosis. No significant proximal left subclavian artery stenosis despite soft and calcified plaque. Normal left vertebral artery origin. Tortuous left V1 segment. The  left vertebral artery is non dominant although still a relatively normal sized to the skull base. CTA HEAD Posterior circulation: Minimal vertebral V4 segment plaque. Dominant distal right vertebral artery. Normal PICA origins. The left vertebral is somewhat diminutive beyond the PICA. Patent vertebrobasilar junction and basilar artery without stenosis. Normal SCA and left PCA origins. Fetal type right PCA origin. Bilateral PCA branches are within normal limits. Anterior circulation: No enhancement of the left ICA siphon through the proximal cavernous segment. Reconstituted enhancement in the distal cavernous, anterior genu and left supraclinoid ICA segments. The left ophthalmic artery origin is patent and enhancing on series 12, image 185. A small left posterior communicating artery is present and enhancing. The contralateral right ICA siphon is patent with severe calcified atherosclerosis. There is moderate to severe stenosis in the right ICA distal cavernous segment and anterior genu. The right ophthalmic and posterior communicating artery origins remain normal. Patent carotid termini. MCA origins, MCA M1 segments, and MCA bifurcations appear patent and symmetrically enhancing. No MCA stenosis is identified. There is severe stenosis at the left ACA origin. The distal A1 segment has a more normal appearance. The anterior communicating artery is present. There is subtle decreased enhancement of the left A2 segment but otherwise the ACA branches are within normal limits. Venous sinuses: Patent. Anatomic variants: Dominant right vertebral artery. Fetal type right PCA origin. Delayed phase: No abnormal enhancement identified. Review of the MIP images confirms the above findings IMPRESSION: 1. Positive for Left ICA occlusion from the origin through the mid left ICA siphon. The distal Left ICA is reconstituted beginning in the cavernous segment, and the Left Ophthalmic Artery origin remains patent (series 12, image  185). 2. No significant contralateral Right Carotid stenosis in the neck, but up to Severe Right ICA siphon stenosis at the distal cavernous segment due to bulky calcified plaque. 3. Severe stenosis at the left ACA origin although only mildly decreased left A2 enhancement compared to the right. Bilateral MCA enhancement is symmetric and within normal limits. 4. No vertebral artery or posterior circulation stenosis. The Right Vertebral is dominant. 5. Mild for age cerebral white matter changes typically due to small vessel disease. No acute or chronic cerebral cortically based infarct identified. 6.  Aortic Atherosclerosis (ICD10-I70.0). Electronically Signed: By: Odessa Fleming M.D. On: 01/23/2018 17:02   Ct Angio Neck W/cm &/or Wo/cm  Addendum Date: 01/23/2018   ADDENDUM REPORT: 01/23/2018 17:10 ADDENDUM: Study discussed by telephone with Dr. Festus Barren on 01/23/2018 at 1705 hours. Electronically Signed   By: Odessa Fleming M.D.   On: 01/23/2018 17:10   Result Date: 01/23/2018 CLINICAL DATA:  62 year old male with 3 weeks of left eye vision loss. Carotid stenosis. Ocular ischemic syndrome. EXAM: CT ANGIOGRAPHY HEAD AND NECK TECHNIQUE: Multidetector CT imaging of the  head and neck was performed using the standard protocol during bolus administration of intravenous contrast. Multiplanar CT image reconstructions and MIPs were obtained to evaluate the vascular anatomy. Carotid stenosis measurements (when applicable) are obtained utilizing NASCET criteria, using the distal internal carotid diameter as the denominator. CONTRAST:  75mL OMNIPAQUE IOHEXOL 350 MG/ML SOLN COMPARISON:  None. FINDINGS: CT HEAD Brain: No midline shift, ventriculomegaly, mass effect, evidence of mass lesion, intracranial hemorrhage or evidence of cortically based acute infarction. No cortical encephalomalacia identified. Mild for age cerebral white matter hypodensity, otherwise gray-white matter differentiation is within normal limits. Calvarium and skull  base: Negative. Paranasal sinuses: Clear. Orbits: The orbits soft tissues appear symmetric and normal. Visualized scalp soft tissues are within normal limits. CTA NECK Skeleton: Carious posterior right mandible dentition. No acute osseous abnormality identified. Upper chest: Possible upper lobe centrilobular emphysema. No superior mediastinal lymphadenopathy. Other neck: Negative; no neck mass or lymphadenopathy. Aortic arch: 3 vessel arch configuration. Mild to moderate arch atherosclerosis with soft and calcified plaque. Right carotid system: No brachiocephalic or right CCA origin stenosis. Soft and calcified plaque at the right ICA origin and bulb resulting in less than 50 % stenosis with respect to the distal vessel. Left carotid system: No left CCA origin stenosis. Patent left CCA to the bifurcation. However, the left ICA is occluded in the bulb just beyond the origin see series 14, image 24. No reconstituted enhancement in the cervical left ICA. Vertebral arteries: No significant proximal right subclavian artery stenosis despite origin plaque. Normal right vertebral artery origin. The right vertebral artery is dominant and patent to the skull base without stenosis. No significant proximal left subclavian artery stenosis despite soft and calcified plaque. Normal left vertebral artery origin. Tortuous left V1 segment. The left vertebral artery is non dominant although still a relatively normal sized to the skull base. CTA HEAD Posterior circulation: Minimal vertebral V4 segment plaque. Dominant distal right vertebral artery. Normal PICA origins. The left vertebral is somewhat diminutive beyond the PICA. Patent vertebrobasilar junction and basilar artery without stenosis. Normal SCA and left PCA origins. Fetal type right PCA origin. Bilateral PCA branches are within normal limits. Anterior circulation: No enhancement of the left ICA siphon through the proximal cavernous segment. Reconstituted enhancement in the  distal cavernous, anterior genu and left supraclinoid ICA segments. The left ophthalmic artery origin is patent and enhancing on series 12, image 185. A small left posterior communicating artery is present and enhancing. The contralateral right ICA siphon is patent with severe calcified atherosclerosis. There is moderate to severe stenosis in the right ICA distal cavernous segment and anterior genu. The right ophthalmic and posterior communicating artery origins remain normal. Patent carotid termini. MCA origins, MCA M1 segments, and MCA bifurcations appear patent and symmetrically enhancing. No MCA stenosis is identified. There is severe stenosis at the left ACA origin. The distal A1 segment has a more normal appearance. The anterior communicating artery is present. There is subtle decreased enhancement of the left A2 segment but otherwise the ACA branches are within normal limits. Venous sinuses: Patent. Anatomic variants: Dominant right vertebral artery. Fetal type right PCA origin. Delayed phase: No abnormal enhancement identified. Review of the MIP images confirms the above findings IMPRESSION: 1. Positive for Left ICA occlusion from the origin through the mid left ICA siphon. The distal Left ICA is reconstituted beginning in the cavernous segment, and the Left Ophthalmic Artery origin remains patent (series 12, image 185). 2. No significant contralateral Right Carotid stenosis in the neck,  but up to Severe Right ICA siphon stenosis at the distal cavernous segment due to bulky calcified plaque. 3. Severe stenosis at the left ACA origin although only mildly decreased left A2 enhancement compared to the right. Bilateral MCA enhancement is symmetric and within normal limits. 4. No vertebral artery or posterior circulation stenosis. The Right Vertebral is dominant. 5. Mild for age cerebral white matter changes typically due to small vessel disease. No acute or chronic cerebral cortically based infarct identified.  6.  Aortic Atherosclerosis (ICD10-I70.0). Electronically Signed: By: Odessa Fleming M.D. On: 01/23/2018 17:02   US Carotid Bilateral  Result Date: 01/20/2018 CLINICAL DATA:  Blurred vision involving the left eye for the past 3 weeks. History of diabetes and smoking. EXAM: BILATERAL CAROTID DUPLEX ULTRASOUND TECHNIQUE: Wallace Cullens scale imaging, color Doppler and duplex ultrasound were performed of bilateral carotid and vertebral arteries in the neck. COMPARISON:  None. FINDINGS: Criteria: Quantification of carotid stenosis is based on velocity parameters that correlate the residual internal carotid diameter with NASCET-based stenosis levels, using the diameter of the distal internal carotid lumen as the denominator for stenosis measurement. The following velocity measurements were obtained: RIGHT ICA:  102/31 cm/sec CCA:  120/21 cm/sec SYSTOLIC ICA/CCA RATIO:  0.9 ECA:  185 cm/sec LEFT ICA: Occluded shortly after its origin CCA:  121/21 cm/sec SYSTOLIC ICA/CCA RATIO:  N/A ECA:  204 cm/sec RIGHT CAROTID ARTERY: There is a minimal amount of echogenic plaque within the right carotid bulb (images 25 and 27), extending to involve the origin and proximal aspects of the right internal carotid artery (image 35), not resulting in elevated peak systolic velocities within the interrogated course of the right internal carotid artery to suggest a hemodynamically significant stenosis. RIGHT VERTEBRAL ARTERY:  Antegrade flow LEFT CAROTID ARTERY: There is a minimal to moderate amount of intimal thickening/atherosclerotic plaque within the left common carotid artery (images 47, 51 and 55). There is a large amount mixed echogenic plaque within the left carotid bulb (image 58). The left internal carotid artery is occluded shortly after its origin (representative image 75). LEFT VERTEBRAL ARTERY:  Antegrade Flow IMPRESSION: 1. Age-indeterminate, though presumably chronic, occlusion of the left internal carotid artery. 2. Minimal amount of  right-sided atherosclerotic plaque, not resulting in elevated peak systolic velocities within the right internal carotid artery to suggest a hemodynamically significant stenosis, though note, velocity measurements are unreliable in the setting of a contralateral occlusion. Further evaluation with CTA could be performed as clinically indicated. 3. Antegrade flow demonstrated within the bilateral vertebral arteries. This was made a call report. Electronically Signed   By: Simonne Come M.D.   On: 01/20/2018 16:27     Assessment/Plan 1. Carotid artery stenosis, symptomatic, left Recommend:  Given the patient's asymptomatic subcritical stenosis no further invasive testing or surgery at this time.  CT angiogram is reviewed by me personally and shows occlusion of the LICA and high grade right siphon disease.  Right proximal ICA shows <50%  Continue antiplatelet therapy as prescribed.  Given the high-grade right siphon lesion I will have him seen by neuro interventional radiology have placed the ambulatory referral.  Continue management of CAD, HTN and Hyperlipidemia Healthy heart diet,  encouraged exercise at least 4 times per week  Follow up in 6 months with duplex ultrasound and physical exam  - Ambulatory referral to Neurology - VAS US CAROTID; Future   A total of 35 minutes was spent with this patient and greater than 50% was spent in counseling and coordination of care  with the patient.  Discussion included the treatment options for vascular disease including indications for surgery and intervention.  Also discussed is the appropriate timing of treatment.  In addition medical therapy was discussed.  2. Type 2 diabetes mellitus with hyperlipidemia (HCC) Continue hypoglycemic medications as already ordered, these medications have been reviewed and there are no changes at this time.  Hgb A1C to be monitored as already arranged by primary service    3. Pure hypercholesterolemia Continue  statin as ordered and reviewed, no changes at this time  4. Primary osteoarthritis of knee, unspecified laterality Continue NSAID medications as already ordered, these medications have been reviewed and there are no changes at this time.  Continued activity and therapy was stressed.     Levora Dredge, MD  01/26/2018 8:31 AM

## 2018-01-27 ENCOUNTER — Encounter (INDEPENDENT_AMBULATORY_CARE_PROVIDER_SITE_OTHER): Payer: Self-pay | Admitting: Vascular Surgery

## 2018-01-27 MED ORDER — CLOPIDOGREL BISULFATE 75 MG PO TABS: 75.0000 mg | ORAL_TABLET | Freq: Every day | ORAL | 4 refills | Status: DC

## 2018-02-05 ENCOUNTER — Other Ambulatory Visit: Payer: Self-pay | Admitting: Nurse Practitioner

## 2018-02-05 DIAGNOSIS — E1159 Type 2 diabetes mellitus with other circulatory complications: Secondary | ICD-10-CM

## 2018-02-05 DIAGNOSIS — E7800 Pure hypercholesterolemia, unspecified: Secondary | ICD-10-CM

## 2018-02-15 ENCOUNTER — Encounter: Payer: Self-pay | Admitting: Family Medicine

## 2018-02-15 ENCOUNTER — Ambulatory Visit: Payer: BLUE CROSS/BLUE SHIELD | Admitting: Family Medicine

## 2018-02-15 VITALS — BP 124/68 | HR 95 | Temp 98.0°F | Resp 16 | Ht 74.0 in | Wt 233.1 lb

## 2018-02-15 DIAGNOSIS — F419 Anxiety disorder, unspecified: Secondary | ICD-10-CM

## 2018-02-15 DIAGNOSIS — E1169 Type 2 diabetes mellitus with other specified complication: Secondary | ICD-10-CM

## 2018-02-15 DIAGNOSIS — Z23 Encounter for immunization: Secondary | ICD-10-CM

## 2018-02-15 DIAGNOSIS — Z6836 Body mass index (BMI) 36.0-36.9, adult: Secondary | ICD-10-CM

## 2018-02-15 DIAGNOSIS — E7800 Pure hypercholesterolemia, unspecified: Secondary | ICD-10-CM | POA: Diagnosis not present

## 2018-02-15 DIAGNOSIS — I6522 Occlusion and stenosis of left carotid artery: Secondary | ICD-10-CM | POA: Diagnosis not present

## 2018-02-15 DIAGNOSIS — E785 Hyperlipidemia, unspecified: Secondary | ICD-10-CM

## 2018-02-15 DIAGNOSIS — Z122 Encounter for screening for malignant neoplasm of respiratory organs: Secondary | ICD-10-CM

## 2018-02-15 DIAGNOSIS — Z716 Tobacco abuse counseling: Secondary | ICD-10-CM

## 2018-02-15 MED ORDER — ESCITALOPRAM OXALATE 10 MG PO TABS: ORAL_TABLET | ORAL | 2 refills | Status: DC

## 2018-02-15 NOTE — Patient Instructions (Signed)
If you don't hear back regarding your vein and vascular referral in 2 weeks, call our office back.

## 2018-02-15 NOTE — Progress Notes (Signed)
Name: Daniel Snyder   MRN: [id number]    DOB: 14-Jun-1955   Date:02/15/2018       Progress Note  Subjective  Chief Complaint  Chief Complaint  Patient presents with  . Follow-up    2 month recheck  . Diabetes    HPI  PT presents for follow up:  Symptomatic Carotid Artery Stenosis/LEFT eye visual loss: Saw Dr. Cottie Banda earlier in the month and was told he has 100% blockage in the LEFT and 60% in the right carotid arteries.  He is following up with Ophthalmology for visual loss to the LEFT eye on 02/21/2018.  He is taking plavix, daily ASA, and crestor 40mg .  He denies chest pain or shortness of breath, headaches, confusion, slurred speech.  He does note that he has ongoing right grip weakness for several months, no claudication symptoms.   Tobacco Use: Down to 1 cigarette per day - was smoking 1ppd when he started having visual loss in September. He is congratulated on this major change and is celebrated for his commitment to continued cessation.   Anxiety: He was taking Xanax, was switched to hydroxyzine but was taking it 1-2 times daily.  He has been taking buspar BID, hydroxyzine once a day PRN, and is on Wellbutrin (more for smoking cessation).  GAD-7 is not imrproved at all - he has been under a lot of stress related to his health lately.  We will add lexapro.  GAD 7 : Generalized Anxiety Score 02/15/2018 11/15/2017  Nervous, Anxious, on Edge 3 2  Control/stop worrying 2 3  Worry too much - different things 2 3  Trouble relaxing 3 3  Restless 3 3  Easily annoyed or irritable 3 3  Afraid - awful might happen 3 2  Total GAD 7 Score 19 19  Anxiety Difficulty Very difficult Very difficult    Diabetes mellitus type 2 Checking sugars?  yes How often? Daily - he checks in the afternoon/evening usually.  Range (low to high) over last two weeks:  180's average; Lowest - 160, Highest - 250 Does patient feel additional teaching/training would be helpful?  yes  Have they attended  Diabetes education classes? yes  Trying to limit white bread, white rice, white potatoes, sweets?  yes Trying to limit sweetened drinks like iced tea, soft drinks, sports drinks, fruit juices?  yes Checking feet every day/night?  yes Last eye exam:  Seeing Ophthalmology currently for above discussed issues Denies: Polyuria, polydipsia, polyphagia, vision changes, or neuropathy. Most recent A1C:  Lab Results  Component Value Date   HGBA1C 6.4 (A) 11/15/2017    We will recheck today. Last CMP Results : is due for repeat today Urine Micro UTD? Yes Current Medication Management: Diabetic Medications:  ACEI/ARB: Yes - 5mg  Lisinopril Statin: Yes Aspirin therapy: Yes  Hyperlipidemia: Current Medication Regimen: Last Lipids: We will recheck today as he had crestor increased at last check due to still being >70 Lab Results  Component Value Date   CHOL 157 11/15/2017   HDL 44 11/15/2017   LDLCALC 85 11/15/2017   TRIG 180 (H) 11/15/2017   CHOLHDL 3.6 11/15/2017   - Current Diet: See below - Denies: Chest pain, shortness of breath, myalgias. - Documented aortic atherosclerosis? Yes - Risk factors for atherosclerosis: diabetes mellitus, hypercholesterolemia, hypertension, smoking and see above has CAD   Obesity: Body mass index is 29.93 kg/m. Weight Management History: Diet: Boiled eggs for breakfast, wheat toast; lunch vegetables; supper doesn't eat much. Has a hamburger  sometimes, baked chicken, doesn't snack much, does drink water (flavored, but 0 sugar). Exercise: Tries to walk about 1.5 miles almost every day. Co-Morbid Conditions: coronary heart disease, diabetes mellitus, dyslipidemias and hypertension; 2 or more of these conditions combined with BMI >30 is considered morbid obesity; is this diagnosis appropriate and/or added to patient's problem list? Yes  Patient Active Problem List   Diagnosis Date Noted  . Carotid artery stenosis, symptomatic, left 01/23/2018  . Class 2  severe obesity due to excess calories with serious comorbidity and body mass index (BMI) of 36.0 to 36.9 in adult [facility]) 12/16/2017  . Tobacco abuse counseling 12/16/2017  . Hyperlipidemia 08/11/2016  . Vitamin D deficiency 08/11/2016  . Annual physical exam 05/05/2016  . Type 2 diabetes mellitus with hyperlipidemia (HCC) 05/05/2016  . Paronychia of finger of right hand 03/09/2016  . Osteoarthritis of knee 09/08/2015  . Olecranon bursitis 09/08/2015  . Swelling of right elbow joint 08/27/2015  . Right knee pain 08/27/2015  . Numbness in feet 08/27/2015  . Anxiety 08/27/2015  . Hx of colonic polyps   . Rectal polyp     Past Surgical History:  Procedure Laterality Date  . COLONOSCOPY N/A 10/10/2014   Procedure: COLONOSCOPY;  Surgeon: [name], MD;  Location: [facility];  Service: Gastroenterology;  Laterality: N/A;  PT WANTS EARLY  . POLYPECTOMY  10/10/2014   Procedure: POLYPECTOMY;  Surgeon: [name], MD;  Location: [facility];  Service: Gastroenterology;;    Family History  Problem Relation Age of Onset  . COPD Mother   . Cancer Father   . Emphysema Father   . Diabetes Sister   . Heart attack Brother   . Heart disease Paternal Grandfather   . Heart attack Paternal Grandfather     Social History   Socioeconomic History  . Marital status: Married    Spouse name: Not on file  . Number of children: Not on file  . Years of education: Not on file  . Highest education level: Not on file  Occupational History  . Not on file  Social Needs  . Financial resource strain: Not on file  . Food insecurity:    Worry: Not on file    Inability: Not on file  . Transportation needs:    Medical: Not on file    Non-medical: Not on file  Tobacco Use  . Smoking status: Current Every Day Smoker    Packs/day: 0.50    Years: 20.00    Pack years: 10.00  . Smokeless tobacco: Never Used  Substance and Sexual Activity  . Alcohol use: Yes    Alcohol/week: 4.0  standard drinks    Types: 4 Cans of beer per week  . Drug use: No  . Sexual activity: Never  Lifestyle  . Physical activity:    Days per week: Not on file    Minutes per session: Not on file  . Stress: Not on file  Relationships  . Social connections:    Talks on phone: Not on file    Gets together: Not on file    Attends religious service: Not on file    Active member of club or organization: Not on file    Attends meetings of clubs or organizations: Not on file    Relationship status: Not on file  . Intimate partner violence:    Fear of current or ex partner: Not on file    Emotionally abused: Not on file    Physically abused: Not  on file    Forced sexual activity: Not on file  Other Topics Concern  . Not on file  Social History Narrative  . Not on file     Current Outpatient Medications:  .  buPROPion (WELLBUTRIN XL) 150 MG 24 hr tablet, Take 1 tablet (150 mg total) by mouth daily., Disp: 90 tablet, Rfl: 0 .  busPIRone (BUSPAR) 5 MG tablet, Take 1 tablet (5 mg total) by mouth 2 (two) times daily., Disp: 180 tablet, Rfl: 0 .  clopidogrel (PLAVIX) 75 MG tablet, Take 1 tablet (75 mg total) by mouth daily., Disp: 30 tablet, Rfl: 4 .  DUREZOL 0.05 % EMUL, TAKE 1 DROP(S) IN LEFT EYE EVERY 2 HOURS, Disp: , Rfl: 2 .  glucose blood (CONTOUR NEXT TEST) test strip, USE AS INSTRUCTED TO CHECK BLOOD GLUCOSE ONCE DAILY*VERIFY METER WITH PT, NO ANSWER 10/30*NOTCOVERED, Disp: 90 each, Rfl: 2 .  hydrOXYzine (VISTARIL) 25 MG capsule, TAKE 1 CAPSULE (25 MG TOTAL) BY MOUTH 3 (THREE) TIMES DAILY AS NEEDED., Disp: 60 capsule, Rfl: 0 .  lisinopril (PRINIVIL,ZESTRIL) 5 MG tablet, Take 1 tablet (5 mg total) by mouth daily., Disp: 90 tablet, Rfl: 0 .  metFORMIN (GLUCOPHAGE) 500 MG tablet, TAKE 1 TABLET (500 MG TOTAL) BY MOUTH 2 (TWO) TIMES DAILY WITH A MEAL FOR 18 DAYS. REFILL TILL APPT, Disp: 180 tablet, Rfl: 0 .  rosuvastatin (CRESTOR) 40 MG tablet, TAKE 1 TABLET BY MOUTH AT NIGHT, Disp: 90 tablet,  Rfl: 0 .  nicotine (NICODERM CQ - DOSED IN MG/24 HOURS) 14 mg/24hr patch, Place 1 patch (14 mg total) onto the skin daily. (Patient not taking: Reported on 02/15/2018), Disp: 28 patch, Rfl: 0  No Known Allergies  I personally reviewed active problem list, medication list, allergies, social history, health maintenance, notes from last encounter, lab results with the patient/caregiver today.   ROS  Constitutional: Negative for fever or weight change.  Respiratory: Negative for cough and shortness of breath.   Cardiovascular: Negative for chest pain or palpitations.  Gastrointestinal: Negative for abdominal pain, no bowel changes.  Musculoskeletal: Negative for gait problem or joint swelling.  Skin: Negative for rash.  Neurological: Negative for dizziness or headache. See above regarding vision changes. No other specific complaints in a complete review of systems (except as listed in HPI above).   Objective  Vitals:   02/15/18 0748  BP: 124/68  Pulse: 95  Resp: 16  Temp: 98 F (36.7 C)  TempSrc: Oral  SpO2: 97%  Weight: 233 lb 1.6 oz (105.7 kg)  Height: 6\' 2"  (1.88 m)   Body mass index is 29.93 kg/m.  Physical Exam  Constitutional: Patient appears well-developed and well-nourished. No distress.  HENT: Head: Normocephalic and atraumatic. Ears: bilateral TMs with no erythema or effusion; Nose: Nose normal. Mouth/Throat: Oropharynx is clear and moist. No oropharyngeal exudate or tonsillar swelling.  Eyes: Conjunctivae and EOM are normal. No scleral icterus.  Pupils are unequal - LEFT pupil is fixed and dilated (baseline after visual loss - ophthalmology provided injection to cause this; RIGHT pupil is round, and reactive to light.  Neck: Normal range of motion. Neck supple. No JVD present. No thyromegaly present.  Cardiovascular: Normal rate, regular rhythm and normal heart sounds.  No murmur heard. No BLE edema. Pulmonary/Chest: Effort normal and breath sounds normal. No  respiratory distress. Musculoskeletal: Normal range of motion, no joint effusions. No gross deformities Neurological: Pt is alert and oriented to person, place, and time. No cranial nerve deficit. Coordination, balance, strength,  speech and gait are normal.  Skin: Skin is warm and dry. No rash noted. No erythema.  Psychiatric: Patient has a normal mood and affect. behavior is normal. Judgment and thought content normal.  No results found for this or any previous visit (from the past 72 hour(s)).  PHQ2/9: Depression screen [facility] 2/9 02/15/2018 12/16/2017 11/15/2017 05/24/2017 04/21/2017  Decreased Interest 0 1 1 0 0  Down, Depressed, Hopeless 0 0 0 0 0  PHQ - 2 Score 0 1 1 0 0  Altered sleeping 0 1 - - -  Tired, decreased energy 1 1 - - -  Change in appetite 0 0 - - -  Feeling bad or failure about yourself  0 0 - - -  Trouble concentrating 0 0 - - -  Moving slowly or fidgety/restless 0 0 - - -  Suicidal thoughts 0 0 - - -  PHQ-9 Score 1 - - - -  Difficult doing work/chores Not difficult at all Not difficult at all - - -   Fall Risk: Fall Risk  02/15/2018 01/23/2018 12/16/2017 11/15/2017 05/24/2017  Falls in the past year? Yes Yes No No No  Number falls in past yr: 2 or more 2 or more - - -  Injury with Fall? No No - - -  Risk for fall due to : - Impaired vision - - -   Assessment & Plan  1. Class 2 severe obesity due to excess calories with serious comorbidity and body mass index (BMI) of 36.0 to 36.9 in adult [facility]) - Ambulatory referral to diabetic education - COMPLETE METABOLIC PANEL WITH GFR - Discussed importance of 150 minutes of physical activity weekly, eat two servings of fish weekly, eat one serving of tree nuts ( cashews, pistachios, pecans, almonds.[name]) every other day, eat 6 servings of fruit/vegetables daily and drink plenty of water and avoid sweet beverages.  2. Type 2 diabetes mellitus with hyperlipidemia (HCC) - Ambulatory referral to diabetic education - COMPLETE  METABOLIC PANEL WITH GFR - Hemoglobin A1c - He is working hard to exercise as tolerated and eat healthy, he is willing to go to DM education classes and this referral is placed.  I congratulated patient on being ready to make lifestyle changes as he has been working hard to improve his lifestyle lately.  3. Carotid artery stenosis, symptomatic, left - Lipid panel - Message is sent to [facility], Referrals coordinator, per pt request to ask that his 2nd opinion referral be sent to [facility] instead of [place] location.  4. Tobacco abuse counseling - The patient was counseled on the dangers of tobacco use, and was advised to quit.  Reviewed strategies to maximize success, including stress management, substitution of other forms of reinforcement, support of family/friends and pharmacotherapy (wellbutrin).   5. Pure hypercholesterolemia - Lipid panel  6. Anxiety - Continue Buspar BID, Hydroxyzine PRN, Wellbutrin daily; add lexapro.  Discussed counseling, not ready at this time. - escitalopram (LEXAPRO) 10 MG tablet; 1/2 tablet once daily for 7 days, then 1 tablet once daily  Dispense: 30 tablet; Refill: 2  7. Encounter for screening for malignant neoplasm of respiratory organs - CT CHEST LUNG CA SCREEN LOW DOSE W/O CM; Future

## 2018-02-16 ENCOUNTER — Telehealth: Payer: Self-pay | Admitting: *Deleted

## 2018-02-16 ENCOUNTER — Other Ambulatory Visit: Payer: Self-pay | Admitting: Family Medicine

## 2018-02-16 DIAGNOSIS — Z87891 Personal history of nicotine dependence: Secondary | ICD-10-CM

## 2018-02-16 DIAGNOSIS — Z6836 Body mass index (BMI) 36.0-36.9, adult: Principal | ICD-10-CM

## 2018-02-16 DIAGNOSIS — Z122 Encounter for screening for malignant neoplasm of respiratory organs: Secondary | ICD-10-CM

## 2018-02-16 LAB — LIPID PANEL
Cholesterol: 134 mg/dL (ref <200)
HDL: 40 mg/dL — ABNORMAL LOW (ref >40)
LDL Cholesterol (Calc): 67 mg/dL (calc)
Non-HDL Cholesterol (Calc): 94 mg/dL (calc) (ref <130)
Total CHOL/HDL Ratio: 3.4 (calc) (ref <5.0)
Triglycerides: 196 mg/dL — ABNORMAL HIGH (ref <150)

## 2018-02-16 LAB — COMPLETE METABOLIC PANEL WITH GFR
AG Ratio: 1.5 (calc) (ref 1.0–2.5)
ALT: 17 U/L (ref 9–46)
AST: 17 U/L (ref 10–35)
Albumin: 4.1 g/dL (ref 3.6–5.1)
Alkaline phosphatase (APISO): 56 U/L (ref 40–115)
BUN: 16 mg/dL (ref 7–25)
CO2: 27 mmol/L (ref 20–32)
Calcium: 9.4 mg/dL (ref 8.6–10.3)
Chloride: 102 mmol/L (ref 98–110)
Creat: 0.98 mg/dL (ref 0.70–1.25)
GFR, Est African American: 97 mL/min/{1.73_m2} (ref >=60)
GFR, Est Non African American: 83 mL/min/{1.73_m2} (ref >=60)
Globulin: 2.8 g/dL (calc) (ref 1.9–3.7)
Glucose, Bld: 162 mg/dL — ABNORMAL HIGH (ref 65–99)
Potassium: 4.3 mmol/L (ref 3.5–5.3)
Sodium: 136 mmol/L (ref 135–146)
Total Bilirubin: 0.5 mg/dL (ref 0.2–1.2)
Total Protein: 6.9 g/dL (ref 6.1–8.1)

## 2018-02-16 LAB — HEMOGLOBIN A1C
Hgb A1c MFr Bld: 7.7 % of total Hgb — ABNORMAL HIGH (ref <5.7)
Mean Plasma Glucose: 174 (calc)
eAG (mmol/L): 9.7 (calc)

## 2018-02-16 MED ORDER — METFORMIN HCL ER 750 MG PO TB24: 1500.0000 mg | ORAL_TABLET | Freq: Every day | ORAL | 1 refills | Status: DC

## 2018-02-16 NOTE — Telephone Encounter (Signed)
Received referral for initial lung cancer screening scan. Contacted patient and obtained smoking history,(current, 37 pack year) as well as answering questions related to screening process. Patient denies signs of lung cancer such as weight loss or hemoptysis. Patient denies comorbidity that would prevent curative treatment if lung cancer were found. Patient is scheduled for shared decision making visit and CT scan on 03/07/18 at 2pm.

## 2018-02-20 ENCOUNTER — Other Ambulatory Visit: Payer: Self-pay | Admitting: Family Medicine

## 2018-02-20 DIAGNOSIS — F419 Anxiety disorder, unspecified: Secondary | ICD-10-CM

## 2018-02-21 DIAGNOSIS — H2000 Unspecified acute and subacute iridocyclitis: Secondary | ICD-10-CM | POA: Diagnosis not present

## 2018-02-21 DIAGNOSIS — H3582 Retinal ischemia: Secondary | ICD-10-CM | POA: Diagnosis not present

## 2018-03-07 ENCOUNTER — Ambulatory Visit
Admission: RE | Admit: 2018-03-07 | Discharge: 2018-03-07 | Disposition: A | Discharge status: Home / Self Care | Payer: BLUE CROSS/BLUE SHIELD | Source: Ambulatory Visit | Attending: Oncology | Admitting: Oncology

## 2018-03-07 ENCOUNTER — Inpatient Hospital Stay: Payer: BLUE CROSS/BLUE SHIELD | Attending: Oncology | Admitting: Oncology

## 2018-03-07 ENCOUNTER — Encounter: Payer: Self-pay | Admitting: Oncology

## 2018-03-07 DIAGNOSIS — Z122 Encounter for screening for malignant neoplasm of respiratory organs: Secondary | ICD-10-CM | POA: Diagnosis not present

## 2018-03-07 DIAGNOSIS — I2510 Atherosclerotic heart disease of native coronary artery without angina pectoris: Secondary | ICD-10-CM | POA: Diagnosis not present

## 2018-03-07 DIAGNOSIS — Z87891 Personal history of nicotine dependence: Secondary | ICD-10-CM

## 2018-03-07 DIAGNOSIS — F17210 Nicotine dependence, cigarettes, uncomplicated: Secondary | ICD-10-CM | POA: Diagnosis not present

## 2018-03-07 DIAGNOSIS — I700 Atherosclerosis of aorta: Secondary | ICD-10-CM | POA: Diagnosis not present

## 2018-03-07 NOTE — Progress Notes (Signed)
In accordance with CMS guidelines, patient has met eligibility criteria including age, absence of signs or symptoms of lung cancer.  Social History   Tobacco Use  . Smoking status: Current Every Day Smoker    Packs/day: 1.00    Years: 37.00    Pack years: 37.00  . Smokeless tobacco: Never Used  Substance Use Topics  . Alcohol use: Yes    Alcohol/week: 4.0 standard drinks    Types: 4 Cans of beer per week  . Drug use: No     A shared decision-making session was conducted prior to the performance of CT scan. This includes one or more decision aids, includes benefits and harms of screening, follow-up diagnostic testing, over-diagnosis, false positive rate, and total radiation exposure.  Counseling on the importance of adherence to annual lung cancer LDCT screening, impact of co-morbidities, and ability or willingness to undergo diagnosis and treatment is imperative for compliance of the program.  Counseling on the importance of continued smoking cessation for former smokers; the importance of smoking cessation for current smokers, and information about tobacco cessation interventions have been given to patient including [place] and [facility] quit [place] programs.  Written order for lung cancer screening with LDCT has been given to the patient and any and all questions have been answered to the best of my abilities.   Yearly follow up will be coordinated by Burgess Estelle, Thoracic Navigator.  Faythe Casa, NP 03/07/2018 12:17 PM

## 2018-03-08 ENCOUNTER — Encounter: Payer: BLUE CROSS/BLUE SHIELD | Attending: Family Medicine | Admitting: Dietician

## 2018-03-08 ENCOUNTER — Encounter: Payer: Self-pay | Admitting: Dietician

## 2018-03-08 VITALS — Ht 69.0 in | Wt 241.6 lb

## 2018-03-08 DIAGNOSIS — Z6835 Body mass index (BMI) 35.0-35.9, adult: Secondary | ICD-10-CM | POA: Diagnosis not present

## 2018-03-08 DIAGNOSIS — E119 Type 2 diabetes mellitus without complications: Secondary | ICD-10-CM

## 2018-03-08 NOTE — Progress Notes (Signed)
Medical Nutrition Therapy: Visit start time: 1530  end time: 1630  Assessment:  Diagnosis: Type 2 diabetes, obesity Past medical history: hyperlipidemia Psychosocial issues/ stress concerns: patient reports high stress level  Preferred learning method:  . Hands-on   Current weight: 241.6lbs Height: 5'9" Medications, supplements: reconciled list in medical record  Progress and evaluation: Patient reports diabetes diagnosis about 4-5 years ago; has not had any previous diabetes education.  Recent HbA1C was 7.7%. Girlfriend cooks and both she and patient are seeking help with appropriate foods for diabetes. They have been working on changes to decrease fried foods and sodas, and increase vegetable and fruit intake. Patient does report some episodes of low BG symptoms when at work, but also has experienced night sweats.    Physical activity: active job + walking 30 minutes, 3 times a week  Dietary Intake:  Usual eating pattern includes 3 meals and 4 snacks per day. Dining out frequency: 0-1 meals per week.  Breakfast: eggs, sausage/ bacon-- now smaller portion at work -- buffet Snack: cereal bar ie Kashi, banana or grapes, carrots Lunch: green beans/ other veg like salad avoids meat due to fried meats Snack: apple or pretzels Supper: chicken, salmon, small steak occasionally + sweet potatoes, green beans turnip greens, corn,  Snack: peanut butter Natural Jif Beverages: water, flavored water, diet sprite not daily, Bud light beer on weekends                                   Nutrition Care Education: Topics covered: diabetes, weight control Basic nutrition: basic food groups, appropriate nutrient balance, appropriate meal and snack schedule, general nutrition guidelines    Weight control: determining reasonable weight goal, appropriate food portions, basic meal planning using plate method and food lists and food models, role of physical activity, body composition vs BMI for determining  healthy weight Diabetes: appropriate meal and snack schedule, appropriate carb intake and balance, healthy carb choices, lean protein choices, importance of vegetable and fruit intake, effects of stress, exercise, pain on BGs, importance of healthy stress management techniques; instructed on treatment for hypoglycemia, encouraged testing BG prior to treatment whenever able to do so.    Nutritional Diagnosis:  [place]-2.2 Altered nutrition-related laboratory As related to Type 2 diabetes.  As evidenced by patient with current HbA1C of 7.7%. [place]-3.3 Overweight/obesity As related to history of excess calories and limited activity.  As evidenced by patient with BMI of 35.7, and patient report of diet history.  Intervention: Instruction as noted above.   Commended patient for changes he has made, including reduction in cigarettes.    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    Set diet goals with direction from patient.   Education Materials given:  . General diet guidelines for Diabetes . Plate Planner with food lists . Sample menus . Goals/ instructions   Learner/ who was taught:  . Patient  . Spouse/ partner  Level of understanding: [name]. Verbalizes/ demonstrates competency   Demonstrated degree of understanding via:   Teach back Learning barriers: . None  Willingness to learn/ readiness for change: . Eager, change in progress   Monitoring and Evaluation:  Dietary intake, exercise, BG control, and body weight      follow up: 05/01/18

## 2018-03-08 NOTE — Patient Instructions (Signed)
   Great job making healthy food choices and eating at regular intervals, keep up the good work!  Continue to increase low-carb veggies and fruits. Eat generous portions of veggies, and include fruits 2-3 or more times a day with meals and snacks.   Find ways to relax and relieve stress.

## 2018-03-10 ENCOUNTER — Other Ambulatory Visit: Payer: Self-pay | Admitting: Family Medicine

## 2018-03-10 ENCOUNTER — Encounter: Payer: Self-pay | Admitting: *Deleted

## 2018-03-10 DIAGNOSIS — F419 Anxiety disorder, unspecified: Secondary | ICD-10-CM

## 2018-03-10 DIAGNOSIS — Z6836 Body mass index (BMI) 36.0-36.9, adult: Secondary | ICD-10-CM

## 2018-03-10 DIAGNOSIS — Z716 Tobacco abuse counseling: Secondary | ICD-10-CM

## 2018-03-13 ENCOUNTER — Other Ambulatory Visit: Payer: Self-pay | Admitting: Family Medicine

## 2018-03-13 DIAGNOSIS — F419 Anxiety disorder, unspecified: Secondary | ICD-10-CM

## 2018-03-13 NOTE — Telephone Encounter (Signed)
Please check in with patient - he was taking hydroxyzine 1x daily PRN at last visit.  Has he been taking more frequently lately? Is he taking Wellbutrin three times daily?  He needs to take buspar three times a day and use hydroxyzine as needed.  If truly needing hydroxyzine refill, please let me know.

## 2018-03-20 DIAGNOSIS — H3582 Retinal ischemia: Secondary | ICD-10-CM | POA: Diagnosis not present

## 2018-03-21 NOTE — Telephone Encounter (Signed)
Taking Wellbutrin 1 x a day

## 2018-03-21 NOTE — Telephone Encounter (Signed)
Patient stated he is taking Hydroxine 1x a day

## 2018-03-22 NOTE — Telephone Encounter (Signed)
Please advise - Wellbutrin once daily, Buspar should be increased to twice daily if he is having anxiety still. Hydroxyzine only as needed.

## 2018-03-31 NOTE — Telephone Encounter (Signed)
Spoke with patient and instructed him how to take medication per Maurice SmallEmily Boyce instructions.

## 2018-04-17 ENCOUNTER — Other Ambulatory Visit (INDEPENDENT_AMBULATORY_CARE_PROVIDER_SITE_OTHER): Payer: Self-pay | Admitting: Vascular Surgery

## 2018-04-20 ENCOUNTER — Other Ambulatory Visit: Payer: Self-pay | Admitting: Family Medicine

## 2018-04-20 DIAGNOSIS — F419 Anxiety disorder, unspecified: Secondary | ICD-10-CM

## 2018-05-01 ENCOUNTER — Ambulatory Visit: Payer: BLUE CROSS/BLUE SHIELD | Admitting: Dietician

## 2018-05-02 DIAGNOSIS — H3582 Retinal ischemia: Secondary | ICD-10-CM | POA: Diagnosis not present

## 2018-05-05 ENCOUNTER — Telehealth: Payer: Self-pay | Admitting: Dietician

## 2018-05-05 NOTE — Telephone Encounter (Signed)
Called Mr. Shake to reschedule his missed appointment from 05/01/18; he rescheduled to 05/17/18.

## 2018-05-07 ENCOUNTER — Other Ambulatory Visit: Payer: Self-pay | Admitting: Nurse Practitioner

## 2018-05-07 DIAGNOSIS — E7800 Pure hypercholesterolemia, unspecified: Secondary | ICD-10-CM

## 2018-05-07 DIAGNOSIS — Z6836 Body mass index (BMI) 36.0-36.9, adult: Secondary | ICD-10-CM

## 2018-05-08 MED ORDER — METFORMIN HCL ER 750 MG PO TB24: 1500.0000 mg | ORAL_TABLET | Freq: Every day | ORAL | 1 refills | Status: DC

## 2018-05-17 ENCOUNTER — Encounter: Payer: Self-pay | Admitting: Dietician

## 2018-05-17 ENCOUNTER — Ambulatory Visit: Payer: BLUE CROSS/BLUE SHIELD | Admitting: Dietician

## 2018-05-17 NOTE — Progress Notes (Signed)
Patient missed his appointment today, which was rescheduled from 05/01/18.

## 2018-05-18 ENCOUNTER — Ambulatory Visit: Payer: BLUE CROSS/BLUE SHIELD | Admitting: Family Medicine

## 2018-05-18 ENCOUNTER — Encounter: Payer: Self-pay | Admitting: Family Medicine

## 2018-05-18 VITALS — BP 132/74 | HR 83 | Temp 97.6°F | Resp 18 | Ht 69.0 in | Wt 237.5 lb

## 2018-05-18 DIAGNOSIS — E66812 Obesity, class 2: Secondary | ICD-10-CM

## 2018-05-18 DIAGNOSIS — Z6836 Body mass index (BMI) 36.0-36.9, adult: Secondary | ICD-10-CM

## 2018-05-18 DIAGNOSIS — I6522 Occlusion and stenosis of left carotid artery: Secondary | ICD-10-CM

## 2018-05-18 DIAGNOSIS — R5383 Other fatigue: Secondary | ICD-10-CM | POA: Diagnosis not present

## 2018-05-18 DIAGNOSIS — R531 Weakness: Secondary | ICD-10-CM

## 2018-05-18 DIAGNOSIS — Z716 Tobacco abuse counseling: Secondary | ICD-10-CM

## 2018-05-18 DIAGNOSIS — F419 Anxiety disorder, unspecified: Secondary | ICD-10-CM

## 2018-05-18 MED ORDER — ESCITALOPRAM OXALATE 20 MG PO TABS: 20.0000 mg | ORAL_TABLET | Freq: Every day | ORAL | 1 refills | Status: DC

## 2018-05-18 MED ORDER — LISINOPRIL 5 MG PO TABS: 5.0000 mg | ORAL_TABLET | Freq: Every day | ORAL | 0 refills | Status: DC

## 2018-05-18 MED ORDER — BUSPIRONE HCL 5 MG PO TABS: 5.0000 mg | ORAL_TABLET | Freq: Two times a day (BID) | ORAL | 1 refills | Status: DC

## 2018-05-18 NOTE — Progress Notes (Signed)
Name: Daniel Snyder   MRN: [id number]    DOB: [birth year]   Date:05/18/2018       Progress Note  Subjective  Chief Complaint  Chief Complaint  Patient presents with  . Fatigue    right side weekness and numbness  . Sexual Problem    HPI  Pt presents with his daughter-in-law with concern for the following:  RIGHT sided weakness: Intermittent for about 6 months - worsening in frequency and intensity.  STarted out as occasional right hand grip weakness, and has now progressed from last visit to RLE/RUE weakness, moaning/can't talk.  Notes some RIGHT sided neck pain.  No vision changes, facial droop, chest pain, shortness of breath during episodes. 2 days ago he fell into his recliner from a standing position. Taking Plavix daily, taking crestor 40mg , and 81mg  ASA.  Was diagnosed with 100% carotid artery blockage in the Fall of 2019, and was told RIGHT carotid was 60% blocked.  Seeing Dr. Lorretta Harp.   Symptomatic Carotid Artery Stenosis/LEFT eye visual loss: Saw Dr. Cottie Banda earlier in the month and was told he has 100% blockage in the LEFT and 60% in the right carotid arteries.  He is following up with Ophthalmology - last visit was October 2019. He is taking plavix, daily ASA, and crestor 40mg . Denies myalgias.  Does note bilateral toes become numb/cold.  Tobacco Use: Down to 1 cigarette per day - was smoking 1ppd when he started having visual loss in September. He is congratulated on this major change and is celebrated for his commitment to continued cessation. Continuing Wellbutrin. Unchanged  Anxiety: He was taking Xanax, was switched to hydroxyzine but was taking it 1-2 times daily. He has been taking buspar BID, hydroxyzine once a day PRN for sleep, and is on Wellbutrin (more for smoking cessation).    Patient Active Problem List   Diagnosis Date Noted  . Carotid artery stenosis, symptomatic, left 01/23/2018  . Class 2 severe obesity due to excess calories with serious comorbidity  and body mass index (BMI) of 36.0 to 36.9 in adult [facility]) 12/16/2017  . Tobacco abuse counseling 12/16/2017  . Hyperlipidemia 08/11/2016  . Vitamin D deficiency 08/11/2016  . Annual physical exam 05/05/2016  . Type 2 diabetes mellitus with hyperlipidemia (HCC) 05/05/2016  . Paronychia of finger of right hand 03/09/2016  . Osteoarthritis of knee 09/08/2015  . Olecranon bursitis 09/08/2015  . Swelling of right elbow joint 08/27/2015  . Right knee pain 08/27/2015  . Numbness in feet 08/27/2015  . Anxiety 08/27/2015  . Hx of colonic polyps   . Rectal polyp     Past Surgical History:  Procedure Laterality Date  . COLONOSCOPY N/A 10/10/2014   Procedure: COLONOSCOPY;  Surgeon: [name], MD;  Location: [facility];  Service: Gastroenterology;  Laterality: N/A;  PT WANTS EARLY  . POLYPECTOMY  10/10/2014   Procedure: POLYPECTOMY;  Surgeon: [name], MD;  Location: [facility];  Service: Gastroenterology;;    Family History  Problem Relation Age of Onset  . COPD Mother   . Cancer Father   . Emphysema Father   . Diabetes Sister   . Heart attack Brother   . Heart disease Paternal Grandfather   . Heart attack Paternal Grandfather     Social History   Socioeconomic History  . Marital status: Divorced    Spouse name: Not on file  . Number of children: 1  . Years of education: Not on file  . Highest education level: Not  on file  Occupational History  . Not on file  Social Needs  . Financial resource strain: Not hard at all  . Food insecurity:    Worry: Never true    Inability: Never true  . Transportation needs:    Medical: No    Non-medical: No  Tobacco Use  . Smoking status: Current Some Day Smoker    Packs/day: 0.25    Years: 37.00    Pack years: 9.25  . Smokeless tobacco: Never Used  . Tobacco comment: 1 cigarette daily  Substance and Sexual Activity  . Alcohol use: Yes    Alcohol/week: 4.0 standard drinks    Types: 4 Cans of beer per week  .  Drug use: No  . Sexual activity: Not Currently    Partners: Female  Lifestyle  . Physical activity:    Days per week: 0 days    Minutes per session: 0 min  . Stress: Not at all  Relationships  . Social connections:    Talks on phone: Twice a week    Gets together: Twice a week    Attends religious service: Never    Active member of club or organization: No    Attends meetings of clubs or organizations: Never    Relationship status: Divorced  . Intimate partner violence:    Fear of current or ex partner: No    Emotionally abused: No    Physically abused: No    Forced sexual activity: No  Other Topics Concern  . Not on file  Social History Narrative  . Not on file     Current Outpatient Medications:  .  buPROPion (WELLBUTRIN XL) 150 MG 24 hr tablet, TAKE 1 TABLET BY MOUTH EVERY DAY, Disp: 90 tablet, Rfl: 0 .  busPIRone (BUSPAR) 5 MG tablet, TAKE 1 TABLET BY MOUTH TWICE A DAY, Disp: 180 tablet, Rfl: 0 .  clopidogrel (PLAVIX) 75 MG tablet, TAKE 1 TABLET BY MOUTH EVERY DAY, Disp: 90 tablet, Rfl: 1 .  escitalopram (LEXAPRO) 10 MG tablet, Take 1 tablet by mouth once daily, Disp: 90 tablet, Rfl: 1 .  glucose blood (CONTOUR NEXT TEST) test strip, USE AS INSTRUCTED TO CHECK BLOOD GLUCOSE ONCE DAILY*VERIFY METER WITH PT, NO ANSWER 10/30*NOTCOVERED, Disp: 90 each, Rfl: 2 .  hydrOXYzine (VISTARIL) 25 MG capsule, TAKE 1 CAPSULE (25 MG TOTAL) BY MOUTH 3 (THREE) TIMES DAILY AS NEEDED., Disp: 60 capsule, Rfl: 0 .  lisinopril (PRINIVIL,ZESTRIL) 5 MG tablet, Take 1 tablet (5 mg total) by mouth daily., Disp: 90 tablet, Rfl: 0 .  metFORMIN (GLUCOPHAGE XR) 750 MG 24 hr tablet, Take 2 tablets (1,500 mg total) by mouth daily with breakfast., Disp: 180 tablet, Rfl: 1 .  rosuvastatin (CRESTOR) 40 MG tablet, TAKE 1 TABLET BY MOUTH AT NIGHT, Disp: 90 tablet, Rfl: 0 .  DUREZOL 0.05 % EMUL, TAKE 1 DROP(S) IN LEFT EYE EVERY 2 HOURS, Disp: , Rfl: 2  No Known Allergies  I personally reviewed active problem  list, medication list, allergies, notes from last encounter, lab results with the patient/caregiver today.   ROS  Ten systems reviewed and is negative except as mentioned in HPI.  Objective  Vitals:   05/18/18 0754  BP: 132/74  Pulse: 83  Resp: 18  Temp: 97.6 F (36.4 C)  TempSrc: Oral  SpO2: 96%  Weight: 237 lb 8 oz (107.7 kg)  Height: 5\' 9"  (1.753 m)    Body mass index is 35.07 kg/m.  Physical Exam Constitutional: Patient appears well-developed and  well-nourished. No distress.  HENT: Head: Normocephalic and atraumatic. Ears: bilateral TMs with no erythema or effusion; Nose: Nose normal. Mouth/Throat: Oropharynx is clear and moist. No oropharyngeal exudate or tonsillar swelling.  Eyes: Conjunctivae and EOM are normal. No scleral icterus.  Pupils are equal, round, and reactive to light.  Neck: Normal range of motion. Neck supple. No JVD present. No thyromegaly present.  Cardiovascular: Normal rate, regular rhythm and normal heart sounds.  No murmur heard. No BLE edema. Pulmonary/Chest: Effort normal and breath sounds normal. No respiratory distress. Musculoskeletal: Normal range of motion, no joint effusions. No gross deformities Neurological: Pt is alert and oriented to person, place, and time. No cranial nerve deficit. Coordination, balance are normal, strength equal, speech and gait are normal. Normal rapid alternating movement, no ataxia in limbs. Skin: Skin is warm and dry. No rash noted. No erythema.  Psychiatric: Patient has a normal mood and affect. behavior is normal. Judgment and thought content normal.  No results found for this or any previous visit (from the past 72 hour(s)).  PHQ2/9: Depression screen [facility] 2/[facility] 05/18/2018 03/08/2018 02/15/2018 12/16/2017 11/15/2017  Decreased Interest 0 0 0 1 1  Down, Depressed, Hopeless 0 0 0 0 0  PHQ - 2 Score 0 0 0 1 1  Altered sleeping 2 - 0 1 -  Tired, decreased energy 0 - 1 1 -  Change in appetite 0 - 0 0 -  Feeling bad  or failure about yourself  0 - 0 0 -  Trouble concentrating 0 - 0 0 -  Moving slowly or fidgety/restless 0 - 0 0 -  Suicidal thoughts 0 - 0 0 -  PHQ-9 Score 2 - 1 - -  Difficult doing work/chores Somewhat difficult - Not difficult at all Not difficult at all -   Fall Risk: Fall Risk  05/18/2018 03/08/2018 02/15/2018 01/23/2018 12/16/2017  Falls in the past year? 0 1 Yes Yes No  Number falls in past yr: 0 1 2 or more 2 or more -  Injury with Fall? 0 0 No No -  Risk for fall due to : - Impaired vision - Impaired vision -  Risk for fall due to: Comment - left eye, will likely improve - - -  Follow up Falls evaluation completed - - - -    Assessment & Plan  1. Class 2 severe obesity due to excess calories with serious comorbidity and body mass index (BMI) of 36.0 to 36.9 in adult [facility](HCC) - Discussed importance of 150 minutes of physical activity weekly, eat two servings of fish weekly, eat one serving of tree nuts ( cashews, pistachios, pecans, almonds.[name].) every other day, eat 6 servings of fruit/vegetables daily and drink plenty of water and avoid sweet beverages.  - lisinopril (PRINIVIL,ZESTRIL) 5 MG tablet; Take 1 tablet (5 mg total) by mouth daily.  Dispense: 90 tablet; Refill: 0 - COMPLETE METABOLIC PANEL WITH GFR - Hemoglobin A1c  2. Carotid artery stenosis, symptomatic, left - Lipid panel - Ambulatory referral to Neurology  3. Tobacco abuse counseling - Down to 1 cigarette daily  4. Right sided weakness - He is asymptomatic in the office today; urgent neuro appt made for Monday with Dr. Malvin JohnsPotter. His is advised to call 911 if another episode occurs over the weekend.  Red flags discussed in detail.  He is taking plavix, ASA, statin therapy. - COMPLETE METABOLIC PANEL WITH GFR - Ambulatory referral to Neurology - CBC w/Diff/Platelet  5. Anxiety - busPIRone (BUSPAR) 5 MG tablet;  Take 1 tablet (5 mg total) by mouth 2 (two) times daily.  Dispense: 270 tablet; Refill: 1 - escitalopram  (LEXAPRO) 20 MG tablet; Take 1 tablet (20 mg total) by mouth daily.  Dispense: 90 tablet; Refill: 1 - GAD-7 is worse today - he is having several health issues that are exacerbating his anxiety. He is congratulated on continued success with decreasing his cigarette smoking, however he is struggling to manage his anxiety. Increase Lexapro per AVS instructions; increase Buspar to TID dosing.  7. Fatigue, unspecified type - Likely related to weakness as above - seeing Neuro on Monday; will proceed based on Dr. Daisy BlossomPotter's evaluation. - Lipid panel - COMPLETE METABOLIC PANEL WITH GFR - Hemoglobin A1c - CBC w/Diff/Platelet

## 2018-05-18 NOTE — Patient Instructions (Addendum)
Dr. Malvin Johns with [facility] Neurology - Monday 05/22/2018 at 9:00am - please arrive at 8:30am.  Call 911 if a weakness episode happens over the weekend.  Melissa, referrals coordinator, will be calling you later today to schedule your neurology appointment.  Escitalopram (Lexapro) - Take 1.5 tablets (15mg ) for 2 weeks or until 10mg  tablets run out (whichever comes first), then increase to 20mg .   You may increase Buspar to Three times daily (every 8 hours).

## 2018-05-22 DIAGNOSIS — I6523 Occlusion and stenosis of bilateral carotid arteries: Secondary | ICD-10-CM | POA: Diagnosis not present

## 2018-05-22 DIAGNOSIS — R531 Weakness: Secondary | ICD-10-CM | POA: Diagnosis not present

## 2018-05-22 DIAGNOSIS — G459 Transient cerebral ischemic attack, unspecified: Secondary | ICD-10-CM | POA: Diagnosis not present

## 2018-05-22 LAB — COMPLETE METABOLIC PANEL WITH GFR
AG Ratio: 1.4 (calc) (ref 1.0–2.5)
ALT: 9 U/L (ref 9–46)
AST: 12 U/L (ref 10–35)
Albumin: 4.2 g/dL (ref 3.6–5.1)
Alkaline phosphatase (APISO): 61 U/L (ref 40–115)
BUN: 15 mg/dL (ref 7–25)
CO2: 26 mmol/L (ref 20–32)
Calcium: 9.5 mg/dL (ref 8.6–10.3)
Chloride: 103 mmol/L (ref 98–110)
Creat: 0.92 mg/dL (ref 0.70–1.25)
GFR, Est African American: 104 mL/min/{1.73_m2} (ref >=60)
GFR, Est Non African American: 90 mL/min/{1.73_m2} (ref >=60)
Globulin: 3 g/dL (calc) (ref 1.9–3.7)
Glucose, Bld: 130 mg/dL — ABNORMAL HIGH (ref 65–99)
Potassium: 4.5 mmol/L (ref 3.5–5.3)
Sodium: 139 mmol/L (ref 135–146)
Total Bilirubin: 0.4 mg/dL (ref 0.2–1.2)
Total Protein: 7.2 g/dL (ref 6.1–8.1)

## 2018-05-22 LAB — LIPID PANEL
Cholesterol: 134 mg/dL (ref <200)
HDL: 36 mg/dL — ABNORMAL LOW (ref >40)
LDL Cholesterol (Calc): 73 mg/dL (calc)
Non-HDL Cholesterol (Calc): 98 mg/dL (calc) (ref <130)
Total CHOL/HDL Ratio: 3.7 (calc) (ref <5.0)
Triglycerides: 176 mg/dL — ABNORMAL HIGH (ref <150)

## 2018-05-22 LAB — CBC WITH DIFFERENTIAL/PLATELET
Absolute Monocytes: 471 cells/uL (ref 200–950)
Basophils Absolute: 30 cells/uL (ref 0–200)
Basophils Relative: 0.4 %
Eosinophils Absolute: 243 cells/uL (ref 15–500)
Eosinophils Relative: 3.2 %
HCT: 37.6 % — ABNORMAL LOW (ref 38.5–50.0)
Hemoglobin: 13.1 g/dL — ABNORMAL LOW (ref 13.2–17.1)
Lymphs Abs: 1391 cells/uL (ref 850–3900)
MCH: 32.2 pg (ref 27.0–33.0)
MCHC: 34.8 g/dL (ref 32.0–36.0)
MCV: 92.4 fL (ref 80.0–100.0)
MPV: 10.9 fL (ref 7.5–12.5)
Monocytes Relative: 6.2 %
Neutro Abs: 5464 cells/uL (ref 1500–7800)
Neutrophils Relative %: 71.9 %
Platelets: 242 10*3/uL (ref 140–400)
RBC: 4.07 10*6/uL — ABNORMAL LOW (ref 4.20–5.80)
RDW: 12 % (ref 11.0–15.0)
Total Lymphocyte: 18.3 %
WBC: 7.6 10*3/uL (ref 3.8–10.8)

## 2018-05-22 LAB — TEST AUTHORIZATION

## 2018-05-22 LAB — HEMOGLOBIN A1C W/OUT EAG: Hgb A1c MFr Bld: 6.5 % of total Hgb — ABNORMAL HIGH (ref <5.7)

## 2018-05-29 DIAGNOSIS — I639 Cerebral infarction, unspecified: Secondary | ICD-10-CM | POA: Diagnosis not present

## 2018-05-30 DIAGNOSIS — R262 Difficulty in walking, not elsewhere classified: Secondary | ICD-10-CM | POA: Diagnosis not present

## 2018-05-30 DIAGNOSIS — Z7902 Long term (current) use of antithrombotics/antiplatelets: Secondary | ICD-10-CM | POA: Diagnosis not present

## 2018-05-30 DIAGNOSIS — Z8673 Personal history of transient ischemic attack (TIA), and cerebral infarction without residual deficits: Secondary | ICD-10-CM | POA: Diagnosis not present

## 2018-05-30 DIAGNOSIS — Z7982 Long term (current) use of aspirin: Secondary | ICD-10-CM | POA: Diagnosis not present

## 2018-05-30 DIAGNOSIS — Z6834 Body mass index (BMI) 34.0-34.9, adult: Secondary | ICD-10-CM | POA: Diagnosis not present

## 2018-05-30 DIAGNOSIS — R0609 Other forms of dyspnea: Secondary | ICD-10-CM | POA: Diagnosis not present

## 2018-05-30 DIAGNOSIS — E1159 Type 2 diabetes mellitus with other circulatory complications: Secondary | ICD-10-CM | POA: Diagnosis not present

## 2018-05-30 DIAGNOSIS — Z01818 Encounter for other preprocedural examination: Secondary | ICD-10-CM | POA: Diagnosis not present

## 2018-05-30 DIAGNOSIS — R012 Other cardiac sounds: Secondary | ICD-10-CM | POA: Diagnosis not present

## 2018-05-30 DIAGNOSIS — E785 Hyperlipidemia, unspecified: Secondary | ICD-10-CM | POA: Diagnosis not present

## 2018-05-30 DIAGNOSIS — Z7984 Long term (current) use of oral hypoglycemic drugs: Secondary | ICD-10-CM | POA: Diagnosis not present

## 2018-05-30 DIAGNOSIS — R931 Abnormal findings on diagnostic imaging of heart and coronary circulation: Secondary | ICD-10-CM | POA: Diagnosis not present

## 2018-05-30 DIAGNOSIS — I10 Essential (primary) hypertension: Secondary | ICD-10-CM | POA: Diagnosis not present

## 2018-05-30 DIAGNOSIS — F17210 Nicotine dependence, cigarettes, uncomplicated: Secondary | ICD-10-CM | POA: Diagnosis not present

## 2018-05-31 DIAGNOSIS — R0600 Dyspnea, unspecified: Secondary | ICD-10-CM | POA: Diagnosis not present

## 2018-05-31 DIAGNOSIS — R0609 Other forms of dyspnea: Secondary | ICD-10-CM | POA: Diagnosis not present

## 2018-05-31 DIAGNOSIS — Z01810 Encounter for preprocedural cardiovascular examination: Secondary | ICD-10-CM | POA: Diagnosis not present

## 2018-05-31 DIAGNOSIS — I2510 Atherosclerotic heart disease of native coronary artery without angina pectoris: Secondary | ICD-10-CM | POA: Diagnosis not present

## 2018-06-02 ENCOUNTER — Encounter: Payer: Self-pay | Admitting: Dietician

## 2018-06-02 NOTE — Progress Notes (Signed)
Have not heard from patient to reschedule his second missed appointment from 05/17/18. Sent letter to referring provider.

## 2018-06-06 DIAGNOSIS — H3582 Retinal ischemia: Secondary | ICD-10-CM | POA: Diagnosis not present

## 2018-06-07 DIAGNOSIS — I6522 Occlusion and stenosis of left carotid artery: Secondary | ICD-10-CM | POA: Diagnosis not present

## 2018-06-07 DIAGNOSIS — Z6835 Body mass index (BMI) 35.0-35.9, adult: Secondary | ICD-10-CM | POA: Diagnosis not present

## 2018-06-07 DIAGNOSIS — I63239 Cerebral infarction due to unspecified occlusion or stenosis of unspecified carotid arteries: Secondary | ICD-10-CM | POA: Diagnosis not present

## 2018-06-08 DIAGNOSIS — I69328 Other speech and language deficits following cerebral infarction: Secondary | ICD-10-CM | POA: Diagnosis not present

## 2018-06-08 DIAGNOSIS — I63232 Cerebral infarction due to unspecified occlusion or stenosis of left carotid arteries: Secondary | ICD-10-CM | POA: Diagnosis not present

## 2018-06-08 DIAGNOSIS — I69351 Hemiplegia and hemiparesis following cerebral infarction affecting right dominant side: Secondary | ICD-10-CM | POA: Diagnosis not present

## 2018-06-08 DIAGNOSIS — Z8673 Personal history of transient ischemic attack (TIA), and cerebral infarction without residual deficits: Secondary | ICD-10-CM | POA: Diagnosis not present

## 2018-06-08 DIAGNOSIS — F17200 Nicotine dependence, unspecified, uncomplicated: Secondary | ICD-10-CM | POA: Diagnosis not present

## 2018-06-08 DIAGNOSIS — Z7902 Long term (current) use of antithrombotics/antiplatelets: Secondary | ICD-10-CM | POA: Diagnosis not present

## 2018-06-08 DIAGNOSIS — I63231 Cerebral infarction due to unspecified occlusion or stenosis of right carotid arteries: Secondary | ICD-10-CM | POA: Diagnosis not present

## 2018-06-08 DIAGNOSIS — Z7982 Long term (current) use of aspirin: Secondary | ICD-10-CM | POA: Diagnosis not present

## 2018-06-09 ENCOUNTER — Telehealth: Payer: Self-pay | Admitting: Emergency Medicine

## 2018-06-09 NOTE — Telephone Encounter (Signed)
Yes, absolutely.  Please advise they follow Dr. Arna Medici instructions.  I have reviewed his documentation.  Lisinopril is d/c'd.

## 2018-06-09 NOTE — Telephone Encounter (Signed)
IS that ok to DC medication

## 2018-06-09 NOTE — Telephone Encounter (Signed)
Copied from CRM [id number]. Topic: General - Other >> Jun 09, 2018 10:55 AM Marylen Ponto wrote: Reason for CRM: Pt daughter in law Dewayne Hatch called in stating patient saw Dr. Valora Piccolo [facility] Neuro surgeon) and he was instructed to stop taking the lisinopril (PRINIVIL,ZESTRIL) 5 MG tablet for the time being. Cb# [phone]

## 2018-06-12 NOTE — Telephone Encounter (Signed)
Patient notified

## 2018-06-19 DIAGNOSIS — R930 Abnormal findings on diagnostic imaging of skull and head, not elsewhere classified: Secondary | ICD-10-CM | POA: Diagnosis not present

## 2018-06-19 DIAGNOSIS — I63233 Cerebral infarction due to unspecified occlusion or stenosis of bilateral carotid arteries: Secondary | ICD-10-CM | POA: Diagnosis not present

## 2018-06-19 DIAGNOSIS — I672 Cerebral atherosclerosis: Secondary | ICD-10-CM | POA: Diagnosis not present

## 2018-06-19 DIAGNOSIS — I6522 Occlusion and stenosis of left carotid artery: Secondary | ICD-10-CM | POA: Diagnosis not present

## 2018-06-19 DIAGNOSIS — I63231 Cerebral infarction due to unspecified occlusion or stenosis of right carotid arteries: Secondary | ICD-10-CM | POA: Diagnosis not present

## 2018-06-22 ENCOUNTER — Other Ambulatory Visit (INDEPENDENT_AMBULATORY_CARE_PROVIDER_SITE_OTHER): Payer: Self-pay | Admitting: Vascular Surgery

## 2018-06-28 DIAGNOSIS — R569 Unspecified convulsions: Secondary | ICD-10-CM | POA: Diagnosis not present

## 2018-07-31 ENCOUNTER — Ambulatory Visit (INDEPENDENT_AMBULATORY_CARE_PROVIDER_SITE_OTHER): Payer: BLUE CROSS/BLUE SHIELD | Admitting: Vascular Surgery

## 2018-07-31 ENCOUNTER — Encounter (INDEPENDENT_AMBULATORY_CARE_PROVIDER_SITE_OTHER): Payer: BLUE CROSS/BLUE SHIELD

## 2018-08-02 ENCOUNTER — Other Ambulatory Visit: Payer: Self-pay | Admitting: Nurse Practitioner

## 2018-08-02 DIAGNOSIS — E7800 Pure hypercholesterolemia, unspecified: Secondary | ICD-10-CM

## 2018-08-13 ENCOUNTER — Other Ambulatory Visit: Payer: Self-pay | Admitting: Family Medicine

## 2018-08-13 DIAGNOSIS — Z6836 Body mass index (BMI) 36.0-36.9, adult: Principal | ICD-10-CM

## 2018-09-14 ENCOUNTER — Encounter (INDEPENDENT_AMBULATORY_CARE_PROVIDER_SITE_OTHER): Payer: Self-pay

## 2018-09-14 ENCOUNTER — Encounter (INDEPENDENT_AMBULATORY_CARE_PROVIDER_SITE_OTHER): Payer: BLUE CROSS/BLUE SHIELD

## 2018-09-14 ENCOUNTER — Ambulatory Visit (INDEPENDENT_AMBULATORY_CARE_PROVIDER_SITE_OTHER): Payer: BLUE CROSS/BLUE SHIELD | Admitting: Vascular Surgery

## 2018-10-01 DIAGNOSIS — H3582 Retinal ischemia: Secondary | ICD-10-CM | POA: Diagnosis not present

## 2018-10-01 DIAGNOSIS — H4052X3 Glaucoma secondary to other eye disorders, left eye, severe stage: Secondary | ICD-10-CM | POA: Diagnosis not present

## 2018-10-18 ENCOUNTER — Telehealth: Payer: Self-pay | Admitting: Family Medicine

## 2018-10-18 DIAGNOSIS — F419 Anxiety disorder, unspecified: Secondary | ICD-10-CM

## 2018-10-18 MED ORDER — ESCITALOPRAM OXALATE 20 MG PO TABS: 20.0000 mg | ORAL_TABLET | Freq: Every day | ORAL | 0 refills | Status: DC

## 2018-10-18 NOTE — Telephone Encounter (Signed)
appt scheduled for July and pt informed script has been sent to pharmacy

## 2018-10-18 NOTE — Telephone Encounter (Signed)
Please schedule patient for follow up in the next 30 days.  

## 2018-10-29 ENCOUNTER — Other Ambulatory Visit: Payer: Self-pay | Admitting: Family Medicine

## 2018-10-29 DIAGNOSIS — E7800 Pure hypercholesterolemia, unspecified: Secondary | ICD-10-CM

## 2018-11-12 ENCOUNTER — Other Ambulatory Visit: Payer: Self-pay | Admitting: Family Medicine

## 2018-11-12 DIAGNOSIS — F419 Anxiety disorder, unspecified: Secondary | ICD-10-CM

## 2018-11-16 ENCOUNTER — Encounter: Payer: Self-pay | Admitting: Family Medicine

## 2018-11-16 ENCOUNTER — Other Ambulatory Visit: Payer: Self-pay

## 2018-11-16 ENCOUNTER — Ambulatory Visit: Payer: BLUE CROSS/BLUE SHIELD | Admitting: Family Medicine

## 2018-11-16 VITALS — BP 140/78 | HR 78 | Temp 97.5°F | Resp 18 | Ht 69.0 in | Wt 244.6 lb

## 2018-11-16 DIAGNOSIS — E1169 Type 2 diabetes mellitus with other specified complication: Secondary | ICD-10-CM | POA: Diagnosis not present

## 2018-11-16 DIAGNOSIS — Z716 Tobacco abuse counseling: Secondary | ICD-10-CM | POA: Diagnosis not present

## 2018-11-16 DIAGNOSIS — E7800 Pure hypercholesterolemia, unspecified: Secondary | ICD-10-CM

## 2018-11-16 DIAGNOSIS — E785 Hyperlipidemia, unspecified: Secondary | ICD-10-CM | POA: Diagnosis not present

## 2018-11-16 DIAGNOSIS — R531 Weakness: Secondary | ICD-10-CM

## 2018-11-16 DIAGNOSIS — Z125 Encounter for screening for malignant neoplasm of prostate: Secondary | ICD-10-CM

## 2018-11-16 DIAGNOSIS — G4700 Insomnia, unspecified: Secondary | ICD-10-CM

## 2018-11-16 DIAGNOSIS — R5383 Other fatigue: Secondary | ICD-10-CM

## 2018-11-16 DIAGNOSIS — I6522 Occlusion and stenosis of left carotid artery: Secondary | ICD-10-CM | POA: Diagnosis not present

## 2018-11-16 DIAGNOSIS — Z6836 Body mass index (BMI) 36.0-36.9, adult: Secondary | ICD-10-CM

## 2018-11-16 DIAGNOSIS — F419 Anxiety disorder, unspecified: Secondary | ICD-10-CM | POA: Diagnosis not present

## 2018-11-16 MED ORDER — METFORMIN HCL 1000 MG PO TABS: ORAL_TABLET | ORAL | 3 refills | Status: DC

## 2018-11-16 MED ORDER — VENLAFAXINE HCL ER 37.5 MG PO CP24: ORAL_CAPSULE | ORAL | 1 refills | Status: DC

## 2018-11-16 NOTE — Patient Instructions (Addendum)
Please call neurosurgery regarding your need for EEG testing and follow up appointment: Phone: [phone]

## 2018-11-16 NOTE — Progress Notes (Signed)
Name: Daniel FilaDavid K Snyder   MRN: [id number]    DOB: [birth year]   Date:11/16/2018       Progress Note  Subjective  Chief Complaint  Chief Complaint  Patient presents with   Hypertension   Hyperlipidemia   Insomnia   Anxiety    medication not working    HPI  RIGHT sided weakness: Intermittent for about 9 months - course has been stable since march.  Started out as occasional right hand grip weakness, and has now progressed to RLE/RUE weakness, moaning/can't talk.  Notes some RIGHT sided neck pain.  No vision changes (LEFT eye is blind, no right eye vision changes), facial droop, chest pain, shortness of breath during episodes.  Taking Plavix daily, taking crestor 40mg , and 81mg  ASA.  Was diagnosed with 100% carotid artery blockage in the Fall of 2019, and was told RIGHT carotid was 60% blocked.  Seeing Dr. Lorretta HarpSchneir with vascular.  [facility] Dr. Valora PiccoloYap who ran several tests and did not feel that his symptoms were related to perfusion issues and referred him to Dr. Regino SchultzeWang who specializes in seizures, but this has been delayed due to COVID-19.   Symptomatic Carotid Artery Stenosis/LEFT eye visual loss/HLD: Saw Dr. ShneirOctober 2019 and was told he has 100% blockage in the LEFT and 60% in the right carotid arteries. He is following up with Ophthalmology - last visit was 1 week ago and was told things were stable, but his LEFT eye is still completely blind.He is taking plavix, daily ASA, and crestor 40mg . Denies myalgias.  Does note bilateral toes become numb/cold.   Tobacco Use: Down to 1/2 pack a week - was smoking 1ppd when he started having visual loss in September. HAs been off of Wellbutrin.Unchanged   Anxiety & Insomnia: He was taking Xanax, was switched to hydroxyzine but was taking it 1-2 times daily and did not feel that it was working, buspar did not help, lexapro was ineffective.  He was on wellbutrin for smoking cessation and this did not seem to worsen or  help things. He is not sure if he snores, does not wake gasping/cough. He does describe anxiety with irritability - we will trial effexor now, and follow up in 6-8 weeks, may consider adding seroquel or trazodone.   Diabetes mellitus type 2 Checking sugars?  no Checking feet every day/night?  yes Last eye exam: 1 week ago - need records Denies: Polyuria, polydipsia, polyphagia, vision changes, or neuropathy. Most recent A1C:  Lab Results  Component Value Date   HGBA1C 6.5 (H) 05/18/2018    We will recheck today. Last CMP Results : is due for repeat today    Component Value Date/Time   NA 139 05/18/2018 0906   K 4.5 05/18/2018 0906   CL 103 05/18/2018 0906   CO2 26 05/18/2018 0906   GLUCOSE 130 (H) 05/18/2018 0906   BUN 15 05/18/2018 0906   CREATININE 0.92 05/18/2018 0906   CALCIUM 9.5 05/18/2018 0906   PROT 7.2 05/18/2018 0906   ALBUMIN 4.1 05/05/2016 0951   AST 12 05/18/2018 0906   ALT 9 05/18/2018 0906   ALKPHOS 69 05/05/2016 0951   BILITOT 0.4 05/18/2018 0906   GFRNONAA 90 05/18/2018 0906   GFRAA 104 05/18/2018 0906   Urine Micro UTD? No Current Medication Management: Diabetic Medications: Was taking metformin, but it was recalled and he never got a refill of IR version.   ACEI/ARB: Was taken off to allow permissive HTN  by Dr. Valora PiccoloYap with neurology Statin: Yes Aspirin therapy: Yes   Patient Active Problem List   Diagnosis Date Noted   Carotid artery stenosis, symptomatic, left 01/23/2018   Class 2 severe obesity due to excess calories with serious comorbidity and body mass index (BMI) of 36.0 to 36.9 in adult (HCC) 12/16/2017   Tobacco abuse counseling 12/16/2017   Hyperlipidemia 08/11/2016   Vitamin D deficiency 08/11/2016   Annual physical exam 05/05/2016   Type 2 diabetes mellitus with hyperlipidemia (HCC) 05/05/2016   Paronychia of finger of right hand 03/09/2016   Osteoarthritis of knee 09/08/2015   Olecranon bursitis 09/08/2015   Swelling of right  elbow joint 08/27/2015   Right knee pain 08/27/2015   Numbness in feet 08/27/2015   Anxiety 08/27/2015   Hx of colonic polyps    Rectal polyp     Past Surgical History:  Procedure Laterality Date   COLONOSCOPY N/A 10/10/2014   Procedure: COLONOSCOPY;  Surgeon: Midge Miniumarren Wohl, MD;  [facility];  Service: Gastroenterology;  Laterality: N/A;  PT WANTS EARLY   POLYPECTOMY  10/10/2014   Procedure: POLYPECTOMY;  Surgeon: Midge Miniumarren Wohl, MD;  [facility];  Service: Gastroenterology;;    Family History  Problem Relation Age of Onset   COPD Mother    Cancer Father    Emphysema Father    Diabetes Sister    Heart attack Brother    Heart disease Paternal Grandfather    Heart attack Paternal Grandfather     Social History   Socioeconomic History   Marital status: Divorced    Spouse name: Not on file   Number of children: 1   Years of education: Not on file   Highest education level: Not on file  Occupational History   Not on file  Social Needs   Financial resource strain: Not hard at all   Food insecurity    Worry: Never true    Inability: Never true   Transportation needs    Medical: No    Non-medical: No  Tobacco Use   Smoking status: Current Some Day Smoker    Packs/day: 0.25    Years: 37.00    Pack years: 9.25   Smokeless tobacco: Never Used   Tobacco comment: 1 cigarette daily  Substance and Sexual Activity   Alcohol use: Yes    Alcohol/week: 4.0 standard drinks    Types: 4 Cans of beer per week   Drug use: No   Sexual activity: Not Currently    Partners: Female  Lifestyle   Physical activity    Days per week: 0 days    Minutes per session: 0 min   Stress: Not at all  Relationships   Social connections    Talks on phone: Twice a week    Gets together: Twice a week    Attends religious service: Never    Active member of club or organization: No    Attends meetings of clubs or organizations: Never      Relationship status: Divorced   Intimate partner violence    Fear of current or ex partner: No    Emotionally abused: No    Physically abused: No    Forced sexual activity: No  Other Topics Concern   Not on file  Social History Narrative   Not on file     Current Outpatient Medications:    buPROPion (WELLBUTRIN XL) 150 MG 24 hr tablet, TAKE 1 TABLET BY MOUTH EVERY DAY, Disp: 90 tablet, Rfl:  0   busPIRone (BUSPAR) 5 MG tablet, Take 1 tablet (5 mg total) by mouth 2 (two) times daily., Disp: 270 tablet, Rfl: 1   clopidogrel (PLAVIX) 75 MG tablet, TAKE 1 TABLET BY MOUTH EVERY DAY, Disp: 90 tablet, Rfl: 1   DUREZOL 0.05 % EMUL, TAKE 1 DROP(S) IN LEFT EYE EVERY 2 HOURS, Disp: , Rfl: 2   escitalopram (LEXAPRO) 20 MG tablet, TAKE 1 TABLET BY MOUTH EVERY DAY, Disp: 90 tablet, Rfl: 1   glucose blood (CONTOUR NEXT TEST) test strip, USE AS INSTRUCTED TO CHECK BLOOD GLUCOSE ONCE DAILY*VERIFY METER WITH PT, NO ANSWER 10/30*NOTCOVERED, Disp: 90 each, Rfl: 2   hydrOXYzine (VISTARIL) 25 MG capsule, TAKE 1 CAPSULE (25 MG TOTAL) BY MOUTH 3 (THREE) TIMES DAILY AS NEEDED., Disp: 60 capsule, Rfl: 0   metFORMIN (GLUCOPHAGE XR) 750 MG 24 hr tablet, Take 2 tablets (1,500 mg total) by mouth daily with breakfast., Disp: 180 tablet, Rfl: 1   rosuvastatin (CRESTOR) 40 MG tablet, TAKE 1 TABLET BY MOUTH EVERY DAY IN THE EVENING, Disp: 90 tablet, Rfl: 0  No Known Allergies  I personally reviewed active problem list, medication list, allergies, notes from last encounter, lab results with the patient/caregiver today.  ROS  Ten systems reviewed and is negative except as mentioned in HPI  Objective  Vitals:   11/16/18 0811  BP: 140/78  Pulse: 78  Resp: 18  Temp: (!) 97.5 F (36.4 C)  TempSrc: Oral  SpO2: 97%  Weight: 244 lb 9.6 oz (110.9 kg)  Height: 5\' 9"  (1.753 m)    Body mass index is 36.12 kg/m.  Physical Exam Constitutional: Patient appears well-developed and well-nourished. No  distress.  HENT: Head: Normocephalic and atraumatic. Eyes: Conjunctivae and EOM are normal. No scleral icterus.  Neck: Normal range of motion. Neck supple. No JVD present. No thyromegaly present.  Cardiovascular: Normal rate, regular rhythm and normal heart sounds.  No murmur heard. No BLE edema. Pulmonary/Chest: Effort normal and breath sounds normal. No respiratory distress. Musculoskeletal: Normal range of motion, no joint effusions. No gross deformities Neurological: Pt is alert and oriented to person, place, and time. No cranial nerve deficit. Coordination, balance, strength, speech and gait are normal.  Skin: Skin is warm and dry. No rash noted. No erythema.  Psychiatric: Patient has a normal mood and affect. behavior is normal. Judgment and thought content normal.  No results found for this or any previous visit (from the past 72 hour(s)).  PHQ2/9: Depression screen [facility] 2/9 11/16/2018 05/18/2018 03/08/2018 02/15/2018 12/16/2017  Decreased Interest 3 0 0 0 1  Down, Depressed, Hopeless 0 0 0 0 0  PHQ - 2 Score 3 0 0 0 1  Altered sleeping 3 2 - 0 1  Tired, decreased energy 3 0 - 1 1  Change in appetite 1 0 - 0 0  Feeling bad or failure about yourself  0 0 - 0 0  Trouble concentrating 1 0 - 0 0  Moving slowly or fidgety/restless 0 0 - 0 0  Suicidal thoughts - 0 - 0 0  PHQ-9 Score 11 2 - 1 -  Difficult doing work/chores Somewhat difficult Somewhat difficult - Not difficult at all Not difficult at all   PHQ-2/9 Result is positive.    Fall Risk: Fall Risk  11/16/2018 05/18/2018 03/08/2018 02/15/2018 01/23/2018  Falls in the past year? 1 0 1 Yes Yes  Number falls in past yr: 1 0 1 2 or more 2 or more  Injury with Fall? 0  0 0 No No  Risk for fall due to : - - Impaired vision - Impaired vision  Risk for fall due to: Comment - - left eye, will likely improve - -  Follow up Falls evaluation completed Falls evaluation completed - - -    Assessment & Plan  1. Anxiety - Trial of effexor  - follow up in 6 weeks, consider trazodone for sleep if needed - venlafaxine XR (EFFEXOR XR) 37.5 MG 24 hr capsule; Take 1 tablet once daily for 7 days, then increase to 2 tablets once daily.  Dispense: 60 capsule; Refill: 1  2. Class 2 severe obesity due to excess calories with serious comorbidity and body mass index (BMI) of 36.0 to 36.9 in adult [facility](HCC) - Discussed importance of 150 minutes of physical activity weekly, eat two servings of fish weekly, eat one serving of tree nuts ( cashews, pistachios, pecans, almonds.[name].) every other day, eat 6 servings of fruit/vegetables daily and drink plenty of water and avoid sweet beverages.   3. Carotid artery stenosis, symptomatic, left - Statin, ASA, follow up with Dr. Gilda CreaseSchnier  4. Tobacco abuse counseling - Encouraged cessation  5. Right sided weakness - Needs to follow up with Dr. Regino SchultzeWang ASAP - COMPLETE METABOLIC PANEL WITH GFR - CBC with Differential/Platelet  6. Fatigue, unspecified type - Follow up with Dr. Regino SchultzeWang ASAP; also consider sleep study in the future - COMPLETE METABOLIC PANEL WITH GFR - CBC with Differential/Platelet  7. Type 2 diabetes mellitus with hyperlipidemia (HCC) - metFORMIN (GLUCOPHAGE) 1000 MG tablet; Take 0.5 tablets (500 mg total) by mouth daily with breakfast for 7 days, THEN 1 tablet (1,000 mg total) daily with breakfast for 7 days, THEN 1 tablet (1,000 mg total) 2 (two) times daily with a meal for 7 days.  Dispense: 180 tablet; Refill: 3 - COMPLETE METABOLIC PANEL WITH GFR - Hemoglobin A1c - Microalbumin / creatinine urine ratio  8. Pure hypercholesterolemia - Lipid panel  9. Insomnia, unspecified type - Consider sleep study int he future; PSA for nocturia, consider trazodone after Effexor initiated.  10. Prostate cancer screening - PSA

## 2018-11-17 LAB — COMPLETE METABOLIC PANEL WITH GFR
AG Ratio: 1.3 (calc) (ref 1.0–2.5)
ALT: 7 U/L — ABNORMAL LOW (ref 9–46)
AST: 10 U/L (ref 10–35)
Albumin: 3.9 g/dL (ref 3.6–5.1)
Alkaline phosphatase (APISO): 66 U/L (ref 35–144)
BUN: 13 mg/dL (ref 7–25)
CO2: 25 mmol/L (ref 20–32)
Calcium: 9.7 mg/dL (ref 8.6–10.3)
Chloride: 104 mmol/L (ref 98–110)
Creat: 0.93 mg/dL (ref 0.70–1.25)
GFR, Est African American: 103 mL/min/{1.73_m2} (ref >=60)
GFR, Est Non African American: 89 mL/min/{1.73_m2} (ref >=60)
Globulin: 2.9 g/dL (calc) (ref 1.9–3.7)
Glucose, Bld: 270 mg/dL — ABNORMAL HIGH (ref 65–99)
Potassium: 4.3 mmol/L (ref 3.5–5.3)
Sodium: 136 mmol/L (ref 135–146)
Total Bilirubin: 0.4 mg/dL (ref 0.2–1.2)
Total Protein: 6.8 g/dL (ref 6.1–8.1)

## 2018-11-17 LAB — CBC WITH DIFFERENTIAL/PLATELET
Absolute Monocytes: 428 cells/uL (ref 200–950)
Basophils Absolute: 37 cells/uL (ref 0–200)
Basophils Relative: 0.6 %
Eosinophils Absolute: 180 cells/uL (ref 15–500)
Eosinophils Relative: 2.9 %
HCT: 36.4 % — ABNORMAL LOW (ref 38.5–50.0)
Hemoglobin: 12.6 g/dL — ABNORMAL LOW (ref 13.2–17.1)
Lymphs Abs: 1221 cells/uL (ref 850–3900)
MCH: 32.1 pg (ref 27.0–33.0)
MCHC: 34.6 g/dL (ref 32.0–36.0)
MCV: 92.6 fL (ref 80.0–100.0)
MPV: 10.9 fL (ref 7.5–12.5)
Monocytes Relative: 6.9 %
Neutro Abs: 4334 cells/uL (ref 1500–7800)
Neutrophils Relative %: 69.9 %
Platelets: 220 10*3/uL (ref 140–400)
RBC: 3.93 10*6/uL — ABNORMAL LOW (ref 4.20–5.80)
RDW: 12.3 % (ref 11.0–15.0)
Total Lymphocyte: 19.7 %
WBC: 6.2 10*3/uL (ref 3.8–10.8)

## 2018-11-17 LAB — LIPID PANEL
Cholesterol: 210 mg/dL — ABNORMAL HIGH (ref <200)
HDL: 33 mg/dL — ABNORMAL LOW (ref >=40)
LDL Cholesterol (Calc): 122 mg/dL (calc) — ABNORMAL HIGH
Non-HDL Cholesterol (Calc): 177 mg/dL (calc) — ABNORMAL HIGH (ref <130)
Total CHOL/HDL Ratio: 6.4 (calc) — ABNORMAL HIGH (ref <5.0)
Triglycerides: 382 mg/dL — ABNORMAL HIGH (ref <150)

## 2018-11-17 LAB — MICROALBUMIN / CREATININE URINE RATIO
Creatinine, Urine: 121 mg/dL (ref 20–320)
Microalb Creat Ratio: 450 mcg/mg creat — ABNORMAL HIGH (ref <30)
Microalb, Ur: 54.4 mg/dL

## 2018-11-17 LAB — HEMOGLOBIN A1C
Hgb A1c MFr Bld: 7.5 % of total Hgb — ABNORMAL HIGH (ref <5.7)
Mean Plasma Glucose: 169 (calc)
eAG (mmol/L): 9.3 (calc)

## 2018-11-17 LAB — PSA: PSA: 0.1 ng/mL (ref <=4.0)

## 2018-11-24 ENCOUNTER — Encounter: Payer: Self-pay | Admitting: Family Medicine

## 2018-11-28 DIAGNOSIS — I2510 Atherosclerotic heart disease of native coronary artery without angina pectoris: Secondary | ICD-10-CM | POA: Diagnosis not present

## 2018-11-28 DIAGNOSIS — I6523 Occlusion and stenosis of bilateral carotid arteries: Secondary | ICD-10-CM | POA: Diagnosis not present

## 2018-11-28 DIAGNOSIS — E782 Mixed hyperlipidemia: Secondary | ICD-10-CM | POA: Diagnosis not present

## 2018-11-28 DIAGNOSIS — I10 Essential (primary) hypertension: Secondary | ICD-10-CM | POA: Diagnosis not present

## 2018-12-08 ENCOUNTER — Other Ambulatory Visit: Payer: Self-pay | Admitting: Family Medicine

## 2018-12-08 DIAGNOSIS — F419 Anxiety disorder, unspecified: Secondary | ICD-10-CM

## 2018-12-28 ENCOUNTER — Encounter: Payer: Self-pay | Admitting: Family Medicine

## 2018-12-28 ENCOUNTER — Other Ambulatory Visit: Payer: Self-pay

## 2018-12-28 ENCOUNTER — Ambulatory Visit (INDEPENDENT_AMBULATORY_CARE_PROVIDER_SITE_OTHER): Payer: BLUE CROSS/BLUE SHIELD | Admitting: Family Medicine

## 2018-12-28 DIAGNOSIS — F419 Anxiety disorder, unspecified: Secondary | ICD-10-CM | POA: Diagnosis not present

## 2018-12-28 DIAGNOSIS — F321 Major depressive disorder, single episode, moderate: Secondary | ICD-10-CM | POA: Diagnosis not present

## 2018-12-28 DIAGNOSIS — G4700 Insomnia, unspecified: Secondary | ICD-10-CM

## 2018-12-28 MED ORDER — VENLAFAXINE HCL ER 75 MG PO CP24: 75.0000 mg | ORAL_CAPSULE | Freq: Every day | ORAL | 1 refills | Status: DC

## 2018-12-28 MED ORDER — BUSPIRONE HCL 10 MG PO TABS: 10.0000 mg | ORAL_TABLET | Freq: Three times a day (TID) | ORAL | 1 refills | Status: DC

## 2018-12-28 NOTE — Progress Notes (Signed)
Name: Daniel FilaDavid K Prothero   MRN: [id number]    DOB: 11/09/1955   Date:12/28/2018       Progress Note  Subjective  Chief Complaint  Chief Complaint  Patient presents with  . Anxiety    6 week follow up    I connected with  Daniel Filaavid K Mattioli on 12/28/18 at  8:40 AM EDT by telephone and verified that I am speaking with the correct person using two identifiers.  I discussed the limitations, risks, security and privacy concerns of performing an evaluation and management service by telephone and the availability of in person appointments. Staff also discussed with the patient that there may be a patient responsible charge related to this service. Patient Location: Home Provider Location: Office Additional Individuals present: None  HPI   Anxiety & Insomnia: Pt has been struggling with anxiety for some time, and over the last year he has had some significant health issues that have worsened the anxiety.  Meds taken in the past that were ineffective: Xanax, hydroxyzine, low dose buspar, lexapro, Wellbutrin.  Buspar did not help at low dose - has been taking 5mg  twice daily - we can increase to 10mg  TID to help with symptoms. He also struggles with racing thoughts at night - may consider adding seroquel or trazodone at next visit.  Effexor was started last visit at 37.5 - this has helped some with his irritability but not as much with anxiety - he'd like to increase dosing of this too.    Patient Active Problem List   Diagnosis Date Noted  . Carotid artery stenosis, symptomatic, left 01/23/2018  . Class 2 severe obesity due to excess calories with serious comorbidity and body mass index (BMI) of 36.0 to 36.9 in adult [facility](HCC) 12/16/2017  . Tobacco abuse counseling 12/16/2017  . Hyperlipidemia 08/11/2016  . Vitamin D deficiency 08/11/2016  . Annual physical exam 05/05/2016  . Type 2 diabetes mellitus with hyperlipidemia (HCC) 05/05/2016  . Paronychia of finger of right hand 03/09/2016  .  Osteoarthritis of knee 09/08/2015  . Olecranon bursitis 09/08/2015  . Swelling of right elbow joint 08/27/2015  . Right knee pain 08/27/2015  . Numbness in feet 08/27/2015  . Anxiety 08/27/2015  . Hx of colonic polyps   . Rectal polyp     Past Surgical History:  Procedure Laterality Date  . COLONOSCOPY N/A 10/10/2014   Procedure: COLONOSCOPY;  Surgeon: Midge Miniumarren Wohl, MD;  [facility];  Service: Gastroenterology;  Laterality: N/A;  PT WANTS EARLY  . POLYPECTOMY  10/10/2014   Procedure: POLYPECTOMY;  Surgeon: Midge Miniumarren Wohl, MD;  [facility];  Service: Gastroenterology;;    Family History  Problem Relation Age of Onset  . COPD Mother   . Cancer Father   . Emphysema Father   . Diabetes Sister   . Heart attack Brother   . Heart disease Paternal Grandfather   . Heart attack Paternal Grandfather     Social History   Socioeconomic History  . Marital status: Divorced    Spouse name: Not on file  . Number of children: 1  . Years of education: Not on file  . Highest education level: Not on file  Occupational History  . Not on file  Social Needs  . Financial resource strain: Not hard at all  . Food insecurity    Worry: Never true    Inability: Never true  . Transportation needs    Medical: No    Non-medical: No  Tobacco  Use  . Smoking status: Current Some Day Smoker    Packs/day: 0.25    Years: 37.00    Pack years: 9.25  . Smokeless tobacco: Never Used  . Tobacco comment: 1 cigarette daily  Substance and Sexual Activity  . Alcohol use: Yes    Alcohol/week: 4.0 standard drinks    Types: 4 Cans of beer per week  . Drug use: No  . Sexual activity: Not Currently    Partners: Female  Lifestyle  . Physical activity    Days per week: 0 days    Minutes per session: 0 min  . Stress: Not at all  Relationships  . Social Musician on phone: Twice a week    Gets together: Twice a week    Attends religious service: Never    Active  member of club or organization: No    Attends meetings of clubs or organizations: Never    Relationship status: Divorced  . Intimate partner violence    Fear of current or ex partner: No    Emotionally abused: No    Physically abused: No    Forced sexual activity: No  Other Topics Concern  . Not on file  Social History Narrative  . Not on file     Current Outpatient Medications:  .  clopidogrel (PLAVIX) 75 MG tablet, TAKE 1 TABLET BY MOUTH EVERY DAY, Disp: 90 tablet, Rfl: 1 .  ezetimibe (ZETIA) 10 MG tablet, Take by mouth., Disp: , Rfl:  .  glucose blood (CONTOUR NEXT TEST) test strip, USE AS INSTRUCTED TO CHECK BLOOD GLUCOSE ONCE DAILY*VERIFY METER WITH PT, NO ANSWER 10/30*NOTCOVERED, Disp: 90 each, Rfl: 2 .  metFORMIN (GLUCOPHAGE) 1000 MG tablet, Take 0.5 tablets (500 mg total) by mouth daily with breakfast for 7 days, THEN 1 tablet (1,000 mg total) daily with breakfast for 7 days, THEN 1 tablet (1,000 mg total) 2 (two) times daily with a meal for 7 days., Disp: 180 tablet, Rfl: 3 .  venlafaxine XR (EFFEXOR-XR) 37.5 MG 24 hr capsule, TAKE 1 TABLET ONCE DAILY FOR 7 DAYS, THEN INCREASE TO 2 TABLETS ONCE DAILY., Disp: 180 capsule, Rfl: 1 .  DUREZOL 0.05 % EMUL, TAKE 1 DROP(S) IN LEFT EYE EVERY 2 HOURS, Disp: , Rfl: 2 .  rosuvastatin (CRESTOR) 40 MG tablet, TAKE 1 TABLET BY MOUTH EVERY DAY IN THE EVENING (Patient not taking: Reported on 12/28/2018), Disp: 90 tablet, Rfl: 0  No Known Allergies  I personally reviewed active problem list, medication list, allergies, notes from last encounter, lab results with the patient/caregiver today.   ROS  Ten systems reviewed and is negative except as mentioned in HPI.  Objective  Virtual encounter, vitals not obtained.  There is no height or weight on file to calculate BMI.  Physical Exam  Pulmonary/Chest: Effort normal. No respiratory distress. Speaking in complete sentences Neurological: Pt is alert and oriented to person, place, and time.  Speech is normal Psychiatric: Patient has a normal mood and affect. behavior is normal. Judgment and thought content normal.  No results found for this or any previous visit (from the past 72 hour(s)).  PHQ2/9: Depression screen [facility] 2/9 12/28/2018 11/16/2018 05/18/2018 03/08/2018 02/15/2018  Decreased Interest 3 3 0 0 0  Down, Depressed, Hopeless 1 0 0 0 0  PHQ - 2 Score 4 3 0 0 0  Altered sleeping 2 3 2  - 0  Tired, decreased energy 3 3 0 - 1  Change in appetite 1 1 0 -  0  Feeling bad or failure about yourself  0 0 0 - 0  Trouble concentrating 1 1 0 - 0  Moving slowly or fidgety/restless 0 0 0 - 0  Suicidal thoughts 0 - 0 - 0  PHQ-9 Score 11 11 2  - 1  Difficult doing work/chores Somewhat difficult Somewhat difficult Somewhat difficult - Not difficult at all   PHQ-2/9 Result is positive.    Fall Risk: Fall Risk  12/28/2018 11/16/2018 05/18/2018 03/08/2018 02/15/2018  Falls in the past year? 0 1 0 1 Yes  Number falls in past yr: 0 1 0 1 2 or more  Injury with Fall? 0 0 0 0 No  Risk for fall due to : - - - Impaired vision -  Risk for fall due to: Comment - - - left eye, will likely improve -  Follow up Falls evaluation completed Falls evaluation completed Falls evaluation completed - -    Assessment & Plan  1. Anxiety - busPIRone (BUSPAR) 10 MG tablet; Take 1 tablet (10 mg total) by mouth 3 (three) times daily.  Dispense: 270 tablet; Refill: 1 - venlafaxine XR (EFFEXOR XR) 75 MG 24 hr capsule; Take 1 capsule (75 mg total) by mouth daily with breakfast.  Dispense: 90 capsule; Refill: 1  2. Current moderate episode of major depressive disorder without prior episode (HCC) - busPIRone (BUSPAR) 10 MG tablet; Take 1 tablet (10 mg total) by mouth 3 (three) times daily.  Dispense: 270 tablet; Refill: 1 - venlafaxine XR (EFFEXOR XR) 75 MG 24 hr capsule; Take 1 capsule (75 mg total) by mouth daily with breakfast.  Dispense: 90 capsule; Refill: 1  3. Insomnia, unspecified type - busPIRone  (BUSPAR) 10 MG tablet; Take 1 tablet (10 mg total) by mouth 3 (three) times daily.  Dispense: 270 tablet; Refill: 1  Increase buspar dosing to 5mg  TID, increase effexor to 75mg  daily.  Follow up in 8 weeks as scheduled. Sooner if having SE's of medication or if medication does not seem to be working after 4-6 weeks.  I discussed the assessment and treatment plan with the patient. The patient was provided an opportunity to ask questions and all were answered. The patient agreed with the plan and demonstrated an understanding of the instructions.   The patient was advised to call back or seek an in-person evaluation if the symptoms worsen or if the condition fails to improve as anticipated.  I provided 10 minutes of non-face-to-face time during this encounter.  Hubbard Hartshorn, FNP

## 2019-01-04 IMAGING — US US CAROTID DUPLEX BILAT
1 series · 13 of 24 positions shown · non-contrast
Comparison: None.

CLINICAL DATA: Blurred vision involving the left eye for the past 3
weeks. History of diabetes and smoking.

EXAM:
BILATERAL CAROTID DUPLEX ULTRASOUND
TECHNIQUE: Gray scale imaging, color Doppler and duplex ultrasound were
performed of bilateral carotid and vertebral arteries in the neck.

[Series 1: us carotid duplex bilat · 0.06mm/px · 69 acquisitions, 13 frames shown]
[im 1/69]
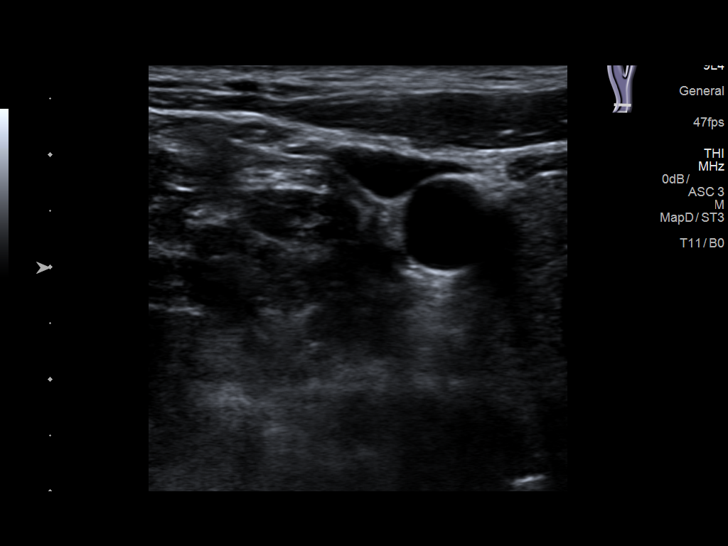
[im 6/69]
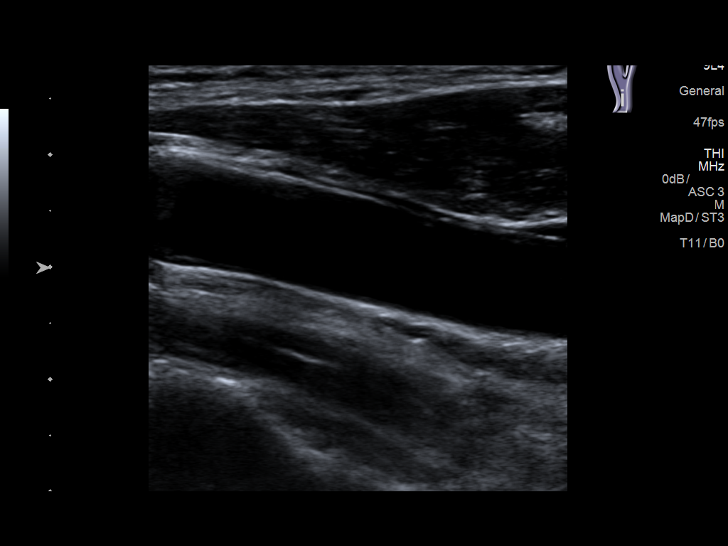
[im 12/69]
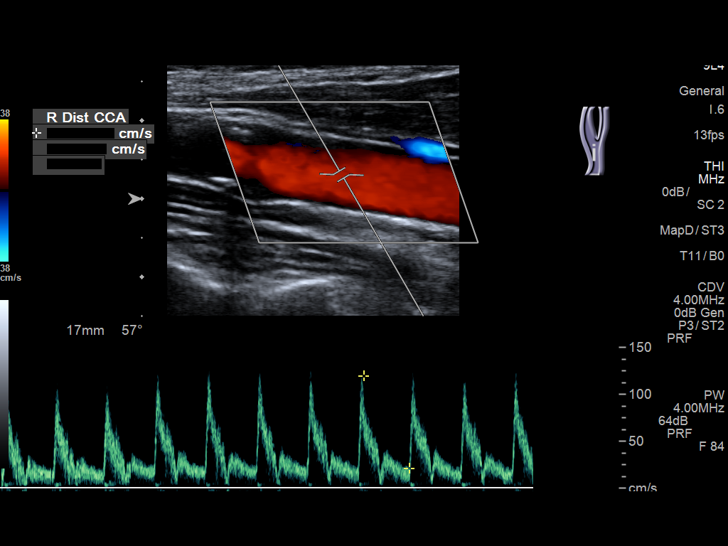
[im 18/69]
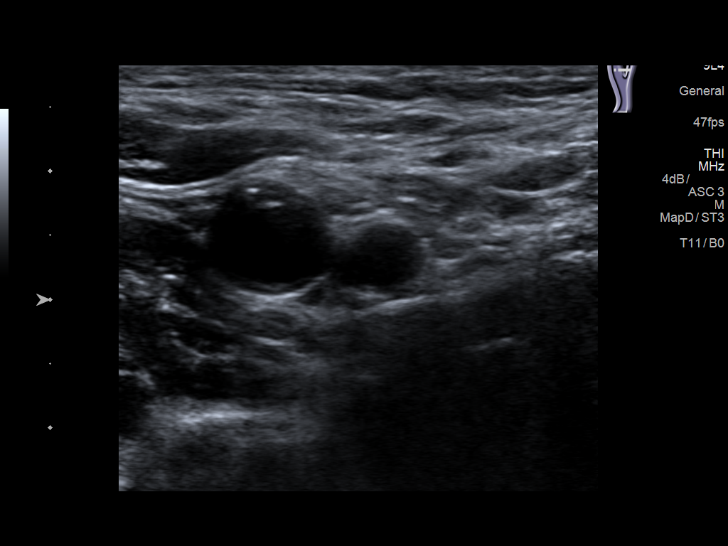
[im 24/69]
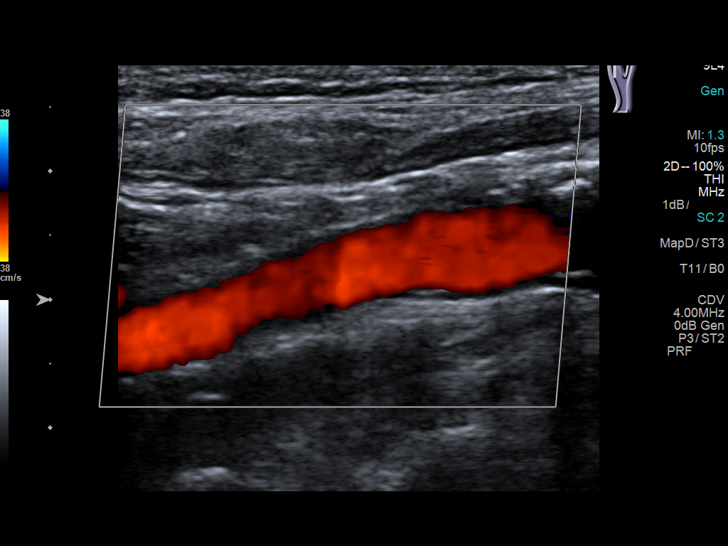
[im 30/69]
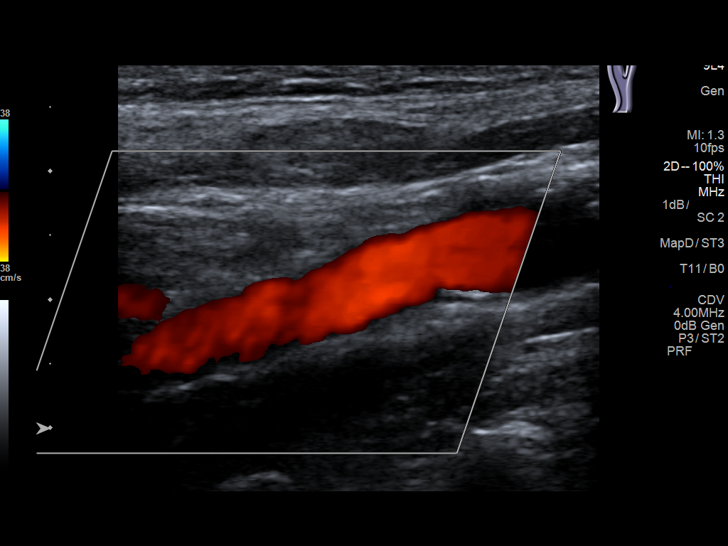
[im 39/69]
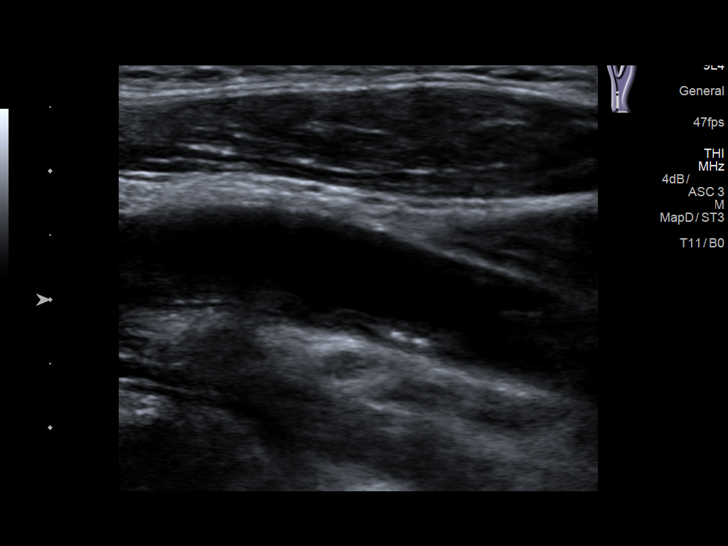
[im 42/69]
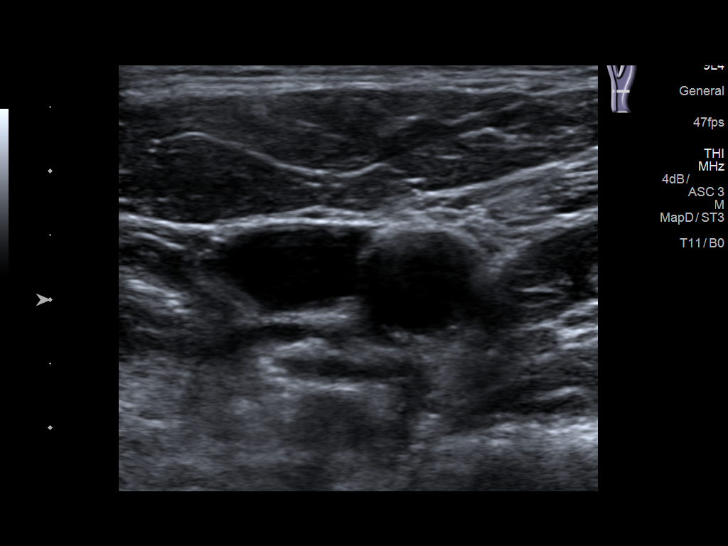
[im 48/69]
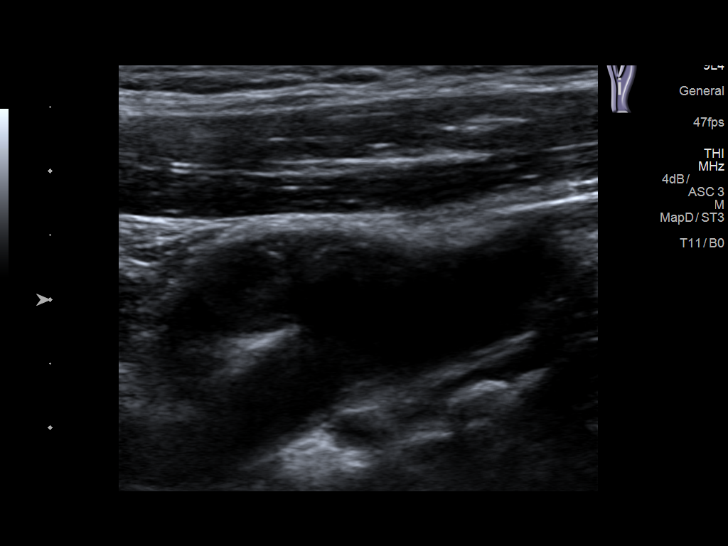
[im 54/69]
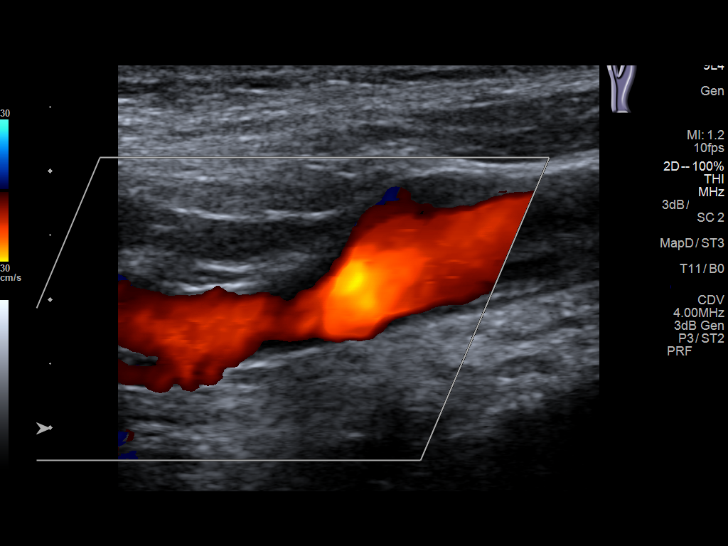
[im 60/69]
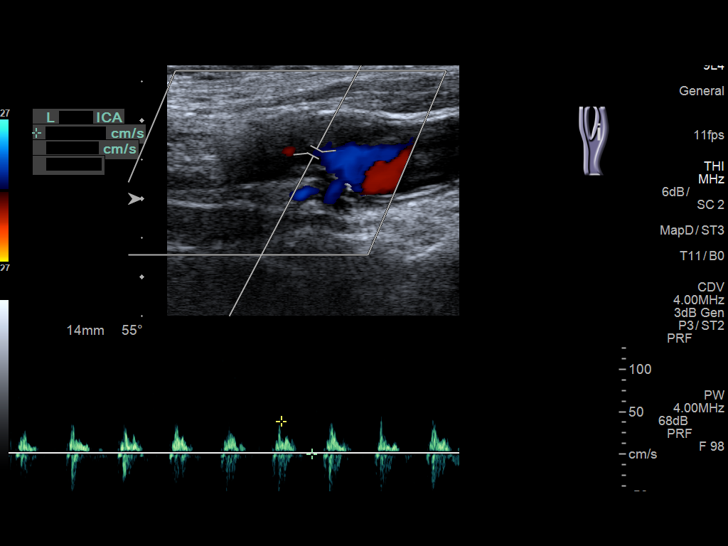
[im 66/69]
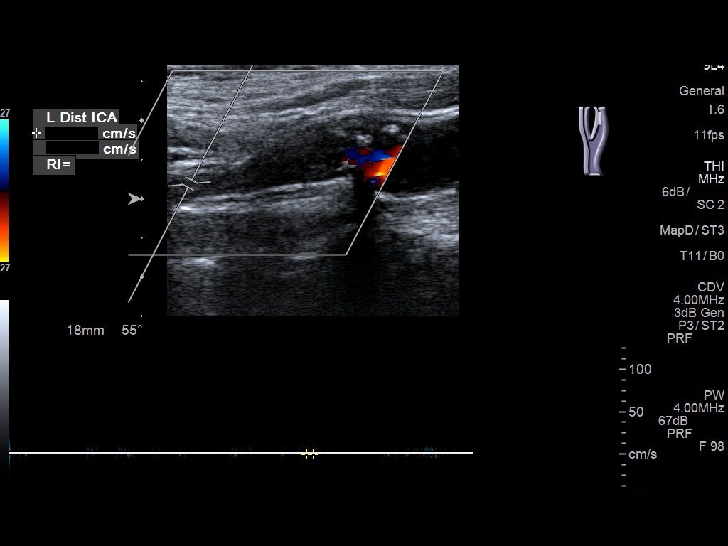
[im 69/69]
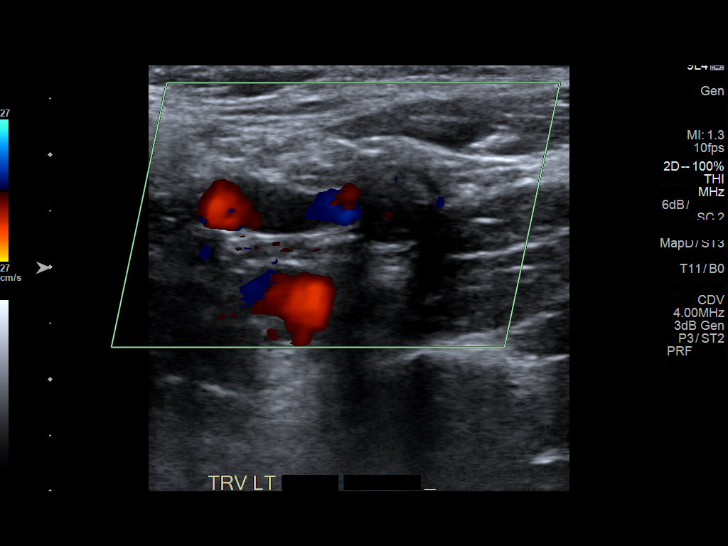

[13 of 24 positions shown; findings below may reference images not displayed]

FINDINGS: Criteria: Quantification of carotid stenosis is based on velocity
parameters that correlate the residual internal carotid diameter
with NASCET-based stenosis levels, using the diameter of the distal
internal carotid lumen as the denominator for stenosis measurement.

The following velocity measurements were obtained:

RIGHT

ICA:  102/31 cm/sec

CCA:  120/21 cm/sec

SYSTOLIC ICA/CCA RATIO:

ECA:  185 cm/sec

LEFT

ICA: Occluded shortly after its origin

CCA:  121/21 cm/sec

SYSTOLIC ICA/CCA RATIO:  N/A

ECA:  204 cm/sec

RIGHT CAROTID ARTERY: There is a minimal amount of echogenic plaque
within the right carotid bulb (images 25 and 27), extending to
involve the origin and proximal aspects of the right internal
carotid artery (image 35), not resulting in elevated peak systolic
velocities within the interrogated course of the right internal
carotid artery to suggest a hemodynamically significant stenosis.

RIGHT VERTEBRAL ARTERY:  Antegrade flow

LEFT CAROTID ARTERY: There is a minimal to moderate amount of
intimal thickening/atherosclerotic plaque within the left common
carotid artery (images 47, 51 and 55). There is a large amount mixed
echogenic plaque within the left carotid bulb (image 58). The left
internal carotid artery is occluded shortly after its origin
(representative image 75).

LEFT VERTEBRAL ARTERY:  Antegrade Flow
IMPRESSION: 1. Age-indeterminate, though presumably chronic, occlusion of the
left internal carotid artery.
2. Minimal amount of right-sided atherosclerotic plaque, not
resulting in elevated peak systolic velocities within the right
internal carotid artery to suggest a hemodynamically significant
stenosis, though note, velocity measurements are unreliable in the
setting of a contralateral occlusion. Further evaluation with CTA
could be performed as clinically indicated.
3. Antegrade flow demonstrated within the bilateral vertebral
arteries.

This was made a call report.

## 2019-01-05 DIAGNOSIS — I10 Essential (primary) hypertension: Secondary | ICD-10-CM | POA: Diagnosis not present

## 2019-01-05 DIAGNOSIS — E119 Type 2 diabetes mellitus without complications: Secondary | ICD-10-CM | POA: Diagnosis not present

## 2019-01-05 DIAGNOSIS — I2510 Atherosclerotic heart disease of native coronary artery without angina pectoris: Secondary | ICD-10-CM | POA: Diagnosis not present

## 2019-01-05 DIAGNOSIS — R569 Unspecified convulsions: Secondary | ICD-10-CM | POA: Diagnosis not present

## 2019-01-05 DIAGNOSIS — Z8673 Personal history of transient ischemic attack (TIA), and cerebral infarction without residual deficits: Secondary | ICD-10-CM | POA: Diagnosis not present

## 2019-01-06 DIAGNOSIS — Z8673 Personal history of transient ischemic attack (TIA), and cerebral infarction without residual deficits: Secondary | ICD-10-CM | POA: Diagnosis not present

## 2019-01-06 DIAGNOSIS — E119 Type 2 diabetes mellitus without complications: Secondary | ICD-10-CM | POA: Diagnosis not present

## 2019-01-06 DIAGNOSIS — I2510 Atherosclerotic heart disease of native coronary artery without angina pectoris: Secondary | ICD-10-CM | POA: Diagnosis not present

## 2019-01-06 DIAGNOSIS — I10 Essential (primary) hypertension: Secondary | ICD-10-CM | POA: Diagnosis not present

## 2019-01-06 DIAGNOSIS — R569 Unspecified convulsions: Secondary | ICD-10-CM | POA: Diagnosis not present

## 2019-01-07 DIAGNOSIS — R569 Unspecified convulsions: Secondary | ICD-10-CM | POA: Diagnosis not present

## 2019-01-07 DIAGNOSIS — Z8673 Personal history of transient ischemic attack (TIA), and cerebral infarction without residual deficits: Secondary | ICD-10-CM | POA: Diagnosis not present

## 2019-01-07 DIAGNOSIS — I10 Essential (primary) hypertension: Secondary | ICD-10-CM | POA: Diagnosis not present

## 2019-01-07 DIAGNOSIS — I2510 Atherosclerotic heart disease of native coronary artery without angina pectoris: Secondary | ICD-10-CM | POA: Diagnosis not present

## 2019-01-07 DIAGNOSIS — E119 Type 2 diabetes mellitus without complications: Secondary | ICD-10-CM | POA: Diagnosis not present

## 2019-01-19 DIAGNOSIS — I6522 Occlusion and stenosis of left carotid artery: Secondary | ICD-10-CM | POA: Diagnosis not present

## 2019-02-08 ENCOUNTER — Other Ambulatory Visit: Payer: Self-pay | Admitting: Family Medicine

## 2019-02-08 DIAGNOSIS — F419 Anxiety disorder, unspecified: Secondary | ICD-10-CM

## 2019-02-08 DIAGNOSIS — G4700 Insomnia, unspecified: Secondary | ICD-10-CM

## 2019-02-08 DIAGNOSIS — F321 Major depressive disorder, single episode, moderate: Secondary | ICD-10-CM

## 2019-02-08 NOTE — Telephone Encounter (Signed)
Requested medication (s) are due for refill today: no  Requested medication (s) are on the active medication list: no  Last refill:  11/15/2018  Future visit scheduled: yes  Notes to clinic:  Medication was discontinued    Requested Prescriptions  Pending Prescriptions Disp Refills   busPIRone (BUSPAR) 5 MG tablet [Pharmacy Med Name: BUSPIRONE HCL 5 MG TABLET] 180 tablet 2    Sig: TAKE 1 TABLET BY MOUTH TWICE A DAY     Psychiatry: Anxiolytics/Hypnotics - Non-controlled Passed - 02/08/2019  2:07 AM      Passed - Valid encounter within last 6 months    Recent Outpatient Visits          1 month ago [place], FNP   2 months ago Class 2 severe obesity due to excess calories with serious comorbidity and body mass index (BMI) of 36.0 to 36.9 in adult [facility])   [place], FNP   8 months ago Class 2 severe obesity due to excess calories with serious comorbidity and body mass index (BMI) of 36.0 to 36.9 in adult [facility])   [place], FNP   11 months ago Type 2 diabetes mellitus with hyperlipidemia [facility])   [place], FNP   1 year ago Left carotid artery occlusion   [place], [place], FNP      Future Appointments            In 1 month Uvaldo Rising, Astrid Divine, [facility], [facility]

## 2019-02-09 MED ORDER — BUSPIRONE HCL 10 MG PO TABS: 10.0000 mg | ORAL_TABLET | Freq: Three times a day (TID) | ORAL | 1 refills | Status: DC

## 2019-02-09 NOTE — Telephone Encounter (Signed)
Refill of correct dosing is placed.

## 2019-02-21 ENCOUNTER — Telehealth: Payer: Self-pay | Admitting: Emergency Medicine

## 2019-02-21 DIAGNOSIS — J310 Chronic rhinitis: Secondary | ICD-10-CM

## 2019-02-21 MED ORDER — FLUTICASONE PROPIONATE 50 MCG/ACT NA SUSP: 2.0000 | Freq: Every day | NASAL | 6 refills | Status: DC

## 2019-02-21 NOTE — Telephone Encounter (Signed)
Patient would like script for Fluticasone sent to [facility]. Did not see this on active list

## 2019-02-21 NOTE — Telephone Encounter (Signed)
Flonase is sent in

## 2019-03-07 ENCOUNTER — Telehealth: Payer: Self-pay

## 2019-03-07 DIAGNOSIS — Z87891 Personal history of nicotine dependence: Secondary | ICD-10-CM

## 2019-03-07 DIAGNOSIS — Z122 Encounter for screening for malignant neoplasm of respiratory organs: Secondary | ICD-10-CM

## 2019-03-07 NOTE — Telephone Encounter (Signed)
Patient has been notified that lung cancer screening CT scan is due currently or will be in near future. Confirmed that patient is within the appropriate age range, and asymptomatic, (no signs or symptoms of lung cancer). Patient denies illness that would prevent curative treatment for lung cancer if found. Verified smoking history (Current Smoker 1/2 ppd ). Patient is agreeable for CT scan being scheduled but states that the only way he would be able to do it is with financial help. I told him that we do offer financial help and advised him to discuss this further with Shawn when he calls to schedule him. He prefers fridays around lunch time.

## 2019-03-08 NOTE — Addendum Note (Signed)
Addended by: Lieutenant Diego on: 03/08/2019 11:02 AM   Modules accepted: Orders

## 2019-03-08 NOTE — Telephone Encounter (Signed)
Smoking history: current, 37.5 pack year °

## 2019-03-16 ENCOUNTER — Ambulatory Visit: Admission: RE | Admit: 2019-03-16 | Payer: BLUE CROSS/BLUE SHIELD | Source: Ambulatory Visit

## 2019-03-19 ENCOUNTER — Encounter: Payer: Self-pay | Admitting: Family Medicine

## 2019-03-19 ENCOUNTER — Other Ambulatory Visit: Payer: Self-pay

## 2019-03-19 ENCOUNTER — Ambulatory Visit (INDEPENDENT_AMBULATORY_CARE_PROVIDER_SITE_OTHER): Payer: BLUE CROSS/BLUE SHIELD | Admitting: Family Medicine

## 2019-03-19 DIAGNOSIS — R531 Weakness: Secondary | ICD-10-CM | POA: Diagnosis not present

## 2019-03-19 DIAGNOSIS — E1169 Type 2 diabetes mellitus with other specified complication: Secondary | ICD-10-CM

## 2019-03-19 DIAGNOSIS — F321 Major depressive disorder, single episode, moderate: Secondary | ICD-10-CM | POA: Diagnosis not present

## 2019-03-19 DIAGNOSIS — E785 Hyperlipidemia, unspecified: Secondary | ICD-10-CM

## 2019-03-19 DIAGNOSIS — F419 Anxiety disorder, unspecified: Secondary | ICD-10-CM | POA: Diagnosis not present

## 2019-03-19 DIAGNOSIS — Z716 Tobacco abuse counseling: Secondary | ICD-10-CM

## 2019-03-19 DIAGNOSIS — I6522 Occlusion and stenosis of left carotid artery: Secondary | ICD-10-CM

## 2019-03-19 DIAGNOSIS — E7800 Pure hypercholesterolemia, unspecified: Secondary | ICD-10-CM

## 2019-03-19 DIAGNOSIS — G4700 Insomnia, unspecified: Secondary | ICD-10-CM

## 2019-03-19 MED ORDER — TRAZODONE HCL 50 MG PO TABS: 25.0000 mg | ORAL_TABLET | Freq: Every evening | ORAL | 3 refills | Status: DC | PRN

## 2019-03-19 NOTE — Progress Notes (Signed)
Name: Daniel Snyder   MRN: [id number]    DOB: 24-Dec-1955   Date:03/19/2019       Progress Note  Subjective  Chief Complaint  Chief Complaint  Patient presents with  . Hyperlipidemia    medication refill  . Insomnia    I connected with  Kallie Locks on 03/19/19 at  8:40 AM EST by telephone and verified that I am speaking with the correct person using two identifiers.  I discussed the limitations, risks, security and privacy concerns of performing an evaluation and management service by telephone and the availability of in person appointments. Staff also discussed with the patient that there may be a patient responsible charge related to this service. Patient Location: Musician - parked Provider Location: Office Additional Individuals present: None  HPI  Anxiety& Insomnia: Pt has been struggling with anxiety for some time, and over the last year he has had some significant health issues that have worsened the anxiety.  Meds taken in the past that were ineffective: Xanax, hydroxyzine, low dose buspar, lexapro, Wellbutrin. Taking buspar 10mg  TID with a slight improvement in anxiety. He also struggles with racing thoughts at night.  Effexor was started last visit at 37.5 - initially it helped, but he stopped on his own because he didn't feel like it was working.  We will add trazodone today and discussed risk/benefit of medication with him in detail.  RIGHT sided weakness: Intermittent for about a year now - course has been stable since march. Started out as occasional right hand grip weakness, and has now progressed to RLE/RUE weakness, moaning/can't talk. Notes some RIGHT sided neck pain. No vision changes (LEFT eye is blind, no right eye vision changes), facial droop, chest pain, shortness of breath during episodes.  Taking Plavix daily, taking crestor 40mg , and 81mg  ASA. Was diagnosed with 100% carotid artery blockage in the Fall of 2019, and was told RIGHT carotid was 60% blocked.   We sent him to neuro in [facility] - saw Dr. James Ivanoff who ran several tests including EEG - was told that there is a possibility that his BP is going too low for just a few seconds, causing the weakness.  He was encouraged to increase his salt intake a bit - this has seemed to help significantly and is having less frequent episodes.   Symptomatic Carotid Artery Stenosis/LEFT eye visual loss/HLD: Seeing Dr. Ronalee Belts every 6 months; he has 100% blockage in the LEFT and 60% in the right carotid arteries. He is following up with Ophthalmology- He is going every 6 months, his LEFT eye is still completely blind.He is taking plavix, daily ASA, crestor 40mg , zetia.Denies myalgias.  He denies any recent vision changes, headaches, dizziness/lightheadedness, no signs or symptoms of bleeding.   Tobacco Use: Is back up to 1 pack a week due to stress - was smoking 1ppd when he started having visual loss in September. Wellbutrin was ineffective.   Diabetes mellitus type 2 Checking sugars?  yes - 110-150's. Checking feet every day/night?  yes Last eye exam: 1 week ago - need records Denies: Polyuria, polydipsia, polyphagia, vision changes. Lab Results  Component Value Date   HGBA1C 7.5 (H) 11/16/2018  We will recheck today. Last CMP Results : is due for repeat today Urine Micro UTD? Yes Current Medication Management: Diabetic Medications: Metformin ACEI/ARB: Was taken off to allow permissive HTN by Dr. James Ivanoff with neurology Statin: Yes Aspirin therapy: Yes   Patient Active Problem List   Diagnosis Date Noted  .  Carotid artery stenosis, symptomatic, left 01/23/2018  . Class 2 severe obesity due to excess calories with serious comorbidity and body mass index (BMI) of 36.0 to 36.9 in adult [facility]) 12/16/2017  . Tobacco abuse counseling 12/16/2017  . Hyperlipidemia 08/11/2016  . Vitamin D deficiency 08/11/2016  . Type 2 diabetes mellitus with hyperlipidemia (HCC) 05/05/2016  . Paronychia of finger of right hand  03/09/2016  . Osteoarthritis of knee 09/08/2015  . Olecranon bursitis 09/08/2015  . Swelling of right elbow joint 08/27/2015  . Right knee pain 08/27/2015  . Numbness in feet 08/27/2015  . Anxiety 08/27/2015  . Rectal polyp     Past Surgical History:  Procedure Laterality Date  . COLONOSCOPY N/A 10/10/2014   Procedure: COLONOSCOPY;  Surgeon: [name], MD;  Location[facility];  Service: Gastroenterology;  Laterality: N/A;  PT WANTS EARLY  . POLYPECTOMY  10/10/2014   Procedure: POLYPECTOMY;  Surgeon: [name], MD;  Location: [facility];  Service: Gastroenterology;;    Family History  Problem Relation Age of Onset  . COPD Mother   . Cancer Father   . Emphysema Father   . Diabetes Sister   . Heart attack Brother   . Heart disease Paternal Grandfather   . Heart attack Paternal Grandfather     Social History   Socioeconomic History  . Marital status: Divorced    Spouse name: Not on file  . Number of children: 1  . Years of education: Not on file  . Highest education level: Not on file  Occupational History  . Not on file  Social Needs  . Financial resource strain: Not hard at all  . Food insecurity    Worry: Never true    Inability: Never true  . Transportation needs    Medical: No    Non-medical: No  Tobacco Use  . Smoking status: Current Some Day Smoker    Packs/day: 0.25    Years: 37.00    Pack years: 9.25  . Smokeless tobacco: Never Used  . Tobacco comment: 1 cigarette daily  Substance and Sexual Activity  . Alcohol use: Yes    Alcohol/week: 4.0 standard drinks    Types: 4 Cans of beer per week  . Drug use: No  . Sexual activity: Not Currently    Partners: Female  Lifestyle  . Physical activity    Days per week: 0 days    Minutes per session: 0 min  . Stress: Not at all  Relationships  . Social Musician on phone: Twice a week    Gets together: Twice a week    Attends religious service: Never    Active member of  club or organization: No    Attends meetings of clubs or organizations: Never    Relationship status: Divorced  . Intimate partner violence    Fear of current or ex partner: No    Emotionally abused: No    Physically abused: No    Forced sexual activity: No  Other Topics Concern  . Not on file  Social History Narrative  . Not on file     Current Outpatient Medications:  .  busPIRone (BUSPAR) 10 MG tablet, Take 1 tablet (10 mg total) by mouth 3 (three) times daily., Disp: 270 tablet, Rfl: 1 .  clopidogrel (PLAVIX) 75 MG tablet, TAKE 1 TABLET BY MOUTH EVERY DAY, Disp: 90 tablet, Rfl: 1 .  DUREZOL 0.05 % EMUL, TAKE 1 DROP(S) IN LEFT EYE EVERY 2 HOURS,  Disp: , Rfl: 2 .  ezetimibe (ZETIA) 10 MG tablet, Take by mouth., Disp: , Rfl:  .  fluticasone (FLONASE) 50 MCG/ACT nasal spray, Place 2 sprays into both nostrils daily., Disp: 16 g, Rfl: 6 .  glucose blood (CONTOUR NEXT TEST) test strip, USE AS INSTRUCTED TO CHECK BLOOD GLUCOSE ONCE DAILY*VERIFY METER WITH PT, NO ANSWER 10/30*NOTCOVERED, Disp: 90 each, Rfl: 2 .  rosuvastatin (CRESTOR) 40 MG tablet, TAKE 1 TABLET BY MOUTH EVERY DAY IN THE EVENING, Disp: 90 tablet, Rfl: 0 .  venlafaxine XR (EFFEXOR XR) 75 MG 24 hr capsule, Take 1 capsule (75 mg total) by mouth daily with breakfast., Disp: 90 capsule, Rfl: 1 .  metFORMIN (GLUCOPHAGE) 1000 MG tablet, Take 0.5 tablets (500 mg total) by mouth daily with breakfast for 7 days, THEN 1 tablet (1,000 mg total) daily with breakfast for 7 days, THEN 1 tablet (1,000 mg total) 2 (two) times daily with a meal for 7 days., Disp: 180 tablet, Rfl: 3  No Known Allergies  I personally reviewed active problem list, medication list, allergies, notes from last encounter, lab results with the patient/caregiver today.   ROS  Ten systems reviewed and is negative except as mentioned in HPI  Objective  Virtual encounter, vitals not obtained.  There is no height or weight on file to calculate BMI.  Physical  Exam  Pulmonary/Chest: Effort normal. No respiratory distress. Speaking in complete sentences Neurological: Pt is alert and oriented to person, place, and time. Speech is normal Psychiatric: Patient has a normal mood and affect. behavior is normal. Judgment and thought content normal.  No results found for this or any previous visit (from the past 72 hour(s)).  PHQ2/9: Depression screen [facility] 2/9 03/19/2019 12/28/2018 11/16/2018 05/18/2018 03/08/2018  Decreased Interest 0 3 3 0 0  Down, Depressed, Hopeless 0 1 0 0 0  PHQ - 2 Score 0 4 3 0 0  Altered sleeping -  Tired, decreased energy 0 -  Change in appetite 0 1 1 0 -  Feeling bad or failure about yourself  0 0 0 0 -  Trouble concentrating 0 1 1 0 -  Moving slowly or fidgety/restless 0 0 0 0 -  Suicidal thoughts 0 0 - 0 -  PHQ-9 Score -  Difficult doing work/chores Somewhat difficult Somewhat difficult Somewhat difficult Somewhat difficult -   PHQ-2/9 Result is positive.    Fall Risk: Fall Risk  03/19/2019 12/28/2018 11/16/2018 05/18/2018 03/08/2018  Falls in the past year? 1 0 1 0 1  Number falls in past yr: 0 0 1 0 1  Injury with Fall? 0 0 0 0 0  Risk for fall due to : - - - - Impaired vision  Risk for fall due to: Comment - - - - left eye, will likely improve  Follow up Falls evaluation completed Falls evaluation completed Falls evaluation completed Falls evaluation completed -    Assessment & Plan  1. Anxiety - continue buspar; will refer to psychiatry at next visit if still not under good control - traZODone (DESYREL) 50 MG tablet; Take 0.5-1 tablets (25-50 mg total) by mouth at bedtime as needed for sleep.  Dispense: 30 tablet; Refill: 3  2. Current moderate episode of major depressive disorder without prior episode (HCC) - traZODone (DESYREL) 50 MG tablet; Take 0.5-1 tablets (25-50 mg total) by mouth at bedtime as needed for sleep.  Dispense: 30 tablet; Refill: 3  3.  Insomnia, unspecified type -  traZODone (DESYREL) 50 MG tablet; Take 0.5-1 tablets (25-50 mg total) by mouth at bedtime as needed for sleep.  Dispense: 30 tablet; Refill: 3  4. Right sided weakness - Improving; fewer episodes lately with BP improvements and adding sodium back to the diet.  5. Carotid artery stenosis, symptomatic, left - Following with Vascular, no changes recently, compliant with plavix, ASA, Zetia, and statin therapy - Lipid panel  6. Pure hypercholesterolemia - Zetia and statin therapy - Lipid panel  7. Tobacco abuse counseling - Discussed, not ready to quit completely.  8. Type 2 diabetes mellitus with hyperlipidemia (HCC) - Doing well back on metformin; due for labs - Hemoglobin A1c - COMPLETE METABOLIC PANEL WITH GFR   I discussed the assessment and treatment plan with the patient. The patient was provided an opportunity to ask questions and all were answered. The patient agreed with the plan and demonstrated an understanding of the instructions.   The patient was advised to call back or seek an in-person evaluation if the symptoms worsen or if the condition fails to improve as anticipated.  I provided 25 minutes of non-face-to-face time during this encounter.  Doren CustardEmily E Zakara Parkey, FNP

## 2019-04-10 ENCOUNTER — Other Ambulatory Visit: Payer: Self-pay | Admitting: Family Medicine

## 2019-04-10 DIAGNOSIS — F321 Major depressive disorder, single episode, moderate: Secondary | ICD-10-CM

## 2019-04-10 DIAGNOSIS — F419 Anxiety disorder, unspecified: Secondary | ICD-10-CM

## 2019-04-10 DIAGNOSIS — G4700 Insomnia, unspecified: Secondary | ICD-10-CM

## 2019-04-10 NOTE — Telephone Encounter (Signed)
Requested medication (s) are due for refill today: yes  Requested medication (s) are on the active medication list: yes  Last refill:  03/19/2019  Future visit scheduled:no  Notes to clinic:  Pharmacy needs Dx code and 90 day supply is requested    Requested Prescriptions  Pending Prescriptions Disp Refills   traZODone (DESYREL) 50 MG tablet [Pharmacy Med Name: TRAZODONE 50 MG TABLET] 90 tablet 2    Sig: Take 0.5-1 tablets (25-50 mg total) by mouth at bedtime as needed for sleep.      Psychiatry: Antidepressants - Serotonin Modulator Passed - 04/10/2019  1:33 PM      Passed - Completed PHQ-2 or PHQ-9 in the last 360 days.      Passed - Valid encounter within last 6 months    Recent Outpatient Visits           3 weeks ago [place], Astrid Divine, FNP   3 months ago [place], FNP   4 months ago Class 2 severe obesity due to excess calories with serious comorbidity and body mass index (BMI) of 36.0 to 36.9 in adult [facility])   [place], FNP   10 months ago Class 2 severe obesity due to excess calories with serious comorbidity and body mass index (BMI) of 36.0 to 36.9 in adult [facility])   [place], FNP   1 year ago Type 2 diabetes mellitus with hyperlipidemia [facility])   [place], [place], [place]

## 2019-04-30 DIAGNOSIS — E119 Type 2 diabetes mellitus without complications: Secondary | ICD-10-CM | POA: Diagnosis not present

## 2019-05-07 ENCOUNTER — Encounter: Payer: Self-pay | Admitting: Family Medicine

## 2019-05-09 ENCOUNTER — Other Ambulatory Visit: Payer: Self-pay | Admitting: Family Medicine

## 2019-05-09 DIAGNOSIS — F419 Anxiety disorder, unspecified: Secondary | ICD-10-CM

## 2019-06-10 ENCOUNTER — Other Ambulatory Visit: Payer: Self-pay | Admitting: Family Medicine

## 2019-06-10 DIAGNOSIS — F419 Anxiety disorder, unspecified: Secondary | ICD-10-CM

## 2019-06-19 DIAGNOSIS — E782 Mixed hyperlipidemia: Secondary | ICD-10-CM | POA: Diagnosis not present

## 2019-06-19 DIAGNOSIS — I739 Peripheral vascular disease, unspecified: Secondary | ICD-10-CM | POA: Diagnosis not present

## 2019-06-19 DIAGNOSIS — I779 Disorder of arteries and arterioles, unspecified: Secondary | ICD-10-CM | POA: Diagnosis not present

## 2019-06-19 DIAGNOSIS — E1159 Type 2 diabetes mellitus with other circulatory complications: Secondary | ICD-10-CM | POA: Diagnosis not present

## 2019-06-21 ENCOUNTER — Other Ambulatory Visit: Payer: Self-pay | Admitting: Family Medicine

## 2019-06-21 DIAGNOSIS — F419 Anxiety disorder, unspecified: Secondary | ICD-10-CM

## 2019-06-21 DIAGNOSIS — F321 Major depressive disorder, single episode, moderate: Secondary | ICD-10-CM

## 2019-06-21 NOTE — Telephone Encounter (Signed)
Requested  medications are  due for refill today no  Requested medications are on the active medication list no  Last refill 03/26/2019  Future visit scheduled no  Notes to clinic Pt stopped med prior to last OV 11/23.

## 2019-07-02 DIAGNOSIS — Z8673 Personal history of transient ischemic attack (TIA), and cerebral infarction without residual deficits: Secondary | ICD-10-CM | POA: Diagnosis not present

## 2019-07-02 DIAGNOSIS — I739 Peripheral vascular disease, unspecified: Secondary | ICD-10-CM | POA: Diagnosis not present

## 2019-07-02 DIAGNOSIS — E1151 Type 2 diabetes mellitus with diabetic peripheral angiopathy without gangrene: Secondary | ICD-10-CM | POA: Diagnosis not present

## 2019-07-02 DIAGNOSIS — F17200 Nicotine dependence, unspecified, uncomplicated: Secondary | ICD-10-CM | POA: Diagnosis not present

## 2019-07-02 DIAGNOSIS — E785 Hyperlipidemia, unspecified: Secondary | ICD-10-CM | POA: Diagnosis not present

## 2019-07-20 ENCOUNTER — Telehealth: Payer: Self-pay | Admitting: *Deleted

## 2019-07-20 NOTE — Telephone Encounter (Signed)
(  07/20/19) Left message for pt to notify them that it is time to schedule annual low dose lung cancer screening CT scan. Instructed patient to call back to verify information prior to the scan being scheduled SRW     

## 2019-08-03 ENCOUNTER — Telehealth: Payer: Self-pay | Admitting: *Deleted

## 2019-08-03 NOTE — Telephone Encounter (Signed)
(  08/03/2019) Left message for pt to notify them that it is time to schedule annual low dose lung cancer screening CT scan. Instructed patient to call back to verify information prior to the scan being scheduled SRW     

## 2019-08-15 ENCOUNTER — Other Ambulatory Visit: Payer: Self-pay | Admitting: Family Medicine

## 2019-08-15 DIAGNOSIS — J310 Chronic rhinitis: Secondary | ICD-10-CM

## 2019-08-15 NOTE — Telephone Encounter (Signed)
Requested Prescriptions  Pending Prescriptions Disp Refills  . fluticasone (FLONASE) 50 MCG/ACT nasal spray [Pharmacy Med Name: FLUTICASONE PROP 50 MCG SPRAY] 48 mL 2    Sig: SPRAY 2 SPRAYS INTO EACH NOSTRIL EVERY DAY     Ear, Nose, and Throat: Nasal Preparations - Corticosteroids Passed - 08/15/2019  1:18 AM      Passed - Valid encounter within last 12 months    Recent Outpatient Visits          4 months ago Anxiety   [facility] [place], Gerome Apley, FNP   7 months ago Anxiety   [facility] [place], Gerome Apley, FNP   9 months ago Class 2 severe obesity due to excess calories with serious comorbidity and body mass index (BMI) of 36.0 to 36.9 in adult [facility])   [facility] Doren Custard, FNP   1 year ago Class 2 severe obesity due to excess calories with serious comorbidity and body mass index (BMI) of 36.0 to 36.9 in adult [facility])   [facility] Doren Custard, FNP   1 year ago Type 2 diabetes mellitus with hyperlipidemia [facility])   [facility] [place] [facility] [place], Gerome Apley, [place]

## 2019-10-16 ENCOUNTER — Encounter: Payer: Self-pay | Admitting: *Deleted

## 2019-10-30 ENCOUNTER — Telehealth: Payer: Self-pay

## 2019-10-30 NOTE — Telephone Encounter (Signed)
Pt needs appt for refills

## 2019-10-31 NOTE — Progress Notes (Addendum)
Patient ID: Daniel Snyder, male    DOB: 1955-10-02, 63 y.o.   MRN: [id number]  PCP: Jamelle Haring, MD  Chief Complaint  Patient presents with  . Foot Swelling    bilaterl, right worse then left  . Hyperlipidemia  . Diabetes  . Depression    medication refills    Subjective:   Daniel Snyder is a 64 y.o. male, presents to clinic with CC of the following:  Chief Complaint  Patient presents with  . Foot Swelling    bilaterl, right worse then left  . Hyperlipidemia  . Diabetes  . Depression    medication refills    HPI:  Patient is a 64 year old male patient of Maurice Small Last visit with Irving Burton was in November 2020 via a virtual visit He follows up today  Patient was seen by vascular on 06/19/2019, with the following assessment/plan noted from that visit:  1. Coronary artery atherosclerosis: Stress SPECT performed 06/15/2018 was low risk without MPI defects, though sensitivity was reduced due to increased gut uptake. No complaints of chest pain today.  - Continue DAPT with ASA and clopidogrel  2. Carotid artery disease / Recurrent spells: 100% occlusion of left ICA. Nonsignificant stenosis of right ICA. Patient is following with neurosurgery / neurology. Patient was planning on undergoing carotid artery revascularization but this was aborted, as right-sided disease was nonsignificant. Continue to have frequent but stable spells. Denies any new neurologic symptoms.  - Continue DAPT, tobacco cessation, and lipid management - Follow up with neuro  3. Hypertension now normotensive - Lisinopril held by neuro without significant change in symptoms. Continue to hold.   4. Mixed hyperlipidemia, most recent LDL 70, triglycerides 266 - Continue rosuvastatin 40 mg daily - Continue Zetia 10 mg daily - Lipids at next visit  5. Claudication Patient reports bilateral below the knee cramping which is likely consistent with claudication given risk factors above.  Symptoms occur with moderate exertion (Rutherford class II). Denies any new wounds or skin breakdown. -Arterial duplex to evaluate with those studies done and normal -Antiplatelet as above. -Tobacco cessation - Lipid management as above  Return in about 6 months (around 12/17/2019) for Recheck.    Anxiety& Insomnia:Pt has been struggling with anxiety for some time, and over the last year he has had some significant health issues that have worsened the anxiety. Meds taken in the past that were ineffective: Xanax, hydroxyzine, low dose buspar, lexapro, effexor, Wellbutrin. Taking buspar 10mg  BID with a slight improvement in anxiety noted previously. He notes his anxiety has not significantly changed, still struggles at times with anxiety.The trazodone that was added last visit byEmily is helpful and now out and requests a refill.  RIGHT sided weakness: course has been stable in recent past. Was diagnosed with 100% carotid artery blockage in the Fall of 2019, and was told RIGHT carotid was 60% blocked. We sent him to neuro in [facility] - saw [facility]. [facility] who ran several tests including EEG - was told that there is a possibility that his BP is going too low for just a few seconds, causing the weakness.  He was encouraged to increase his salt intake a bit, but worried is making him swell so lessened.    Symptomatic Carotid Artery Stenosis/LEFT eye visual loss/HLD: Seeing vascular every 6 months; he has 100% blockage in the LEFT and 60% in the right carotid arteries.  He noted today that he continues to have spells when he stands up.  Describes the spells as occurring 3-4 times a week where he stands up and has right hand feeling shaky with transient weakness. He noted that to vascular as well. Denies any new neurologic symptoms.  Not checking BP's at home presently  He is following up with Ophthalmology- He was going every 6 months, now yearly. 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 Medication regimen-plavix, daily ASA, crestor , zetia-10 mg. Denies myalgias.   He denies any exertional chest pain, shortness of breath, + feet more swollen - when on them a lot. Does go back down.   no signs or symptoms of bleeding.   Tobacco Use: Is back up to 1 pack a day, encouraged trying to lessen again.  Diabetes mellitustype 2 Medication regimen-Metformin Takes medication regularly Not checking at home  Lab Results  Component Value Date   HGBA1C 7.5 (H) 11/16/2018   HGBA1C 6.5 (H) 05/18/2018   HGBA1C 7.7 (H) 02/15/2018   Lab Results  Component Value Date   MICROALBUR 54.4 11/16/2018   LDLCALC 122 (H) 11/16/2018   CREATININE 0.93 11/16/2018   Labs ordered 02/2019 and not completed Denies Polyuria, polydipsia, polyphagia, does drink a lot of water noted  ACEI/ARB:Was taken off to allow permissive HTN by Dr. Valora Piccolo with neurology Statin:Yes  Obesity  Wt Readings from Last 3 Encounters:  11/01/19 233 lb 11.2 oz (106 kg)  11/16/18 244 lb 9.6 oz (110.9 kg)  05/18/18 237 lb 8 oz (107.7 kg)     Patient Active Problem List   Diagnosis Date Noted  . Right sided weakness 03/19/2019  . Insomnia 03/19/2019  . Current moderate episode of major depressive disorder without prior episode (HCC) 03/19/2019  . Carotid artery stenosis, symptomatic, left 01/23/2018  . Class 2 severe obesity due to excess calories with serious comorbidity and body mass index (BMI) of 36.0 to 36.9 in adult [facility]) 12/16/2017  . Tobacco abuse counseling 12/16/2017  . Hyperlipidemia 08/11/2016  . Vitamin D deficiency 08/11/2016  . Type 2 diabetes mellitus with hyperlipidemia (HCC) 05/05/2016  . Paronychia of finger of right hand 03/09/2016  . Osteoarthritis of knee 09/08/2015  . Olecranon bursitis 09/08/2015  . Swelling of right elbow joint 08/27/2015  . Right knee pain 08/27/2015  . Numbness in feet 08/27/2015  . Anxiety 08/27/2015  . Rectal polyp       Current  Outpatient Medications:  .  busPIRone (BUSPAR) 10 MG tablet, Take 1 tablet (10 mg total) by mouth 3 (three) times daily., Disp: 270 tablet, Rfl: 1 .  clopidogrel (PLAVIX) 75 MG tablet, TAKE 1 TABLET BY MOUTH EVERY DAY, Disp: 90 tablet, Rfl: 1 .  escitalopram (LEXAPRO) 20 MG tablet, TAKE 1 TABLET BY MOUTH EVERY DAY, Disp: 90 tablet, Rfl: 1 .  ezetimibe (ZETIA) 10 MG tablet, Take by mouth., Disp: , Rfl:  .  fluticasone (FLONASE) 50 MCG/ACT nasal spray, SPRAY 2 SPRAYS INTO EACH NOSTRIL EVERY DAY, Disp: 48 mL, Rfl: 2 .  metFORMIN (GLUCOPHAGE) 1000 MG tablet, Take 0.5 tablets (500 mg total) by mouth daily with breakfast for 7 days, THEN 1 tablet (1,000 mg total) daily with breakfast for 7 days, THEN 1 tablet (1,000 mg total) 2 (two) times daily with a meal for 7 days., Disp: 180 tablet, Rfl: 3 .  rosuvastatin (CRESTOR) 40 MG tablet, TAKE 1 TABLET BY MOUTH EVERY DAY IN THE EVENING, Disp: 90 tablet, Rfl: 0 .  traZODone (DESYREL) 50 MG tablet, TAKE 0.5-1 TABLETS (25-50 MG TOTAL)  BY MOUTH AT BEDTIME AS NEEDED FOR SLEEP., Disp: 90 tablet, Rfl: 1 .  DUREZOL 0.05 % EMUL, TAKE 1 DROP(S) IN LEFT EYE EVERY 2 HOURS (Patient not taking: Reported on 11/01/2019), Disp: , Rfl: 2 .  glucose blood (CONTOUR NEXT TEST) test strip, USE AS INSTRUCTED TO CHECK BLOOD GLUCOSE ONCE DAILY*VERIFY METER WITH PT, NO ANSWER 10/30*NOTCOVERED (Patient not taking: Reported on 11/01/2019), Disp: 90 each, Rfl: 2   No Known Allergies   Past Surgical History:  Procedure Laterality Date  . COLONOSCOPY N/A 10/10/2014   Procedure: COLONOSCOPY;  Surgeon: Midge Miniumarren Wohl, MD;  [facility];  Service: Gastroenterology;  Laterality: N/A;  PT WANTS EARLY  . POLYPECTOMY  10/10/2014   Procedure: POLYPECTOMY;  Surgeon: Midge Miniumarren Wohl, MD;  [facility];  Service: Gastroenterology;;     Family History  Problem Relation Age of Onset  . COPD Mother   . Cancer Father   . Emphysema Father   . Diabetes Sister   . Heart  attack Brother   . Heart disease Paternal Grandfather   . Heart attack Paternal Grandfather      Social History   Tobacco Use  . Smoking status: Current Some Day Smoker    Packs/day: 0.25    Years: 37.00    Pack years: 9.25  . Smokeless tobacco: Never Used  . Tobacco comment: 1 cigarette daily  Substance Use Topics  . Alcohol use: Yes    Alcohol/week: 4.0 standard drinks    Types: 4 Cans of beer per week    With staff assistance, above reviewed with the patient today.  ROS: As per HPI, otherwise no specific complaints on a limited and focused system review   No results found for this or any previous visit (from the past 72 hour(s)).   PHQ2/9: Depression screen [facility] 2/9 11/01/2019 03/19/2019 12/28/2018 11/16/2018 05/18/2018  Decreased Interest 3 0 3 3 0  Down, Depressed, Hopeless 0 0 1 0 0  PHQ - 2 Score 3 0 4 3 0  Altered sleeping 3 3 2 3 2   Tired, decreased energy 3 3 3 3  0  Change in appetite 0 0 1 1 0  Feeling bad or failure about yourself  0 0 0 0 0  Trouble concentrating 1 0 1 1 0  Moving slowly or fidgety/restless 0 0 0 0 0  Suicidal thoughts 0 0 0 - 0  PHQ-9 Score 10 6 11 11 2   Difficult doing work/chores Somewhat difficult Somewhat difficult Somewhat difficult Somewhat difficult Somewhat difficult  Some recent data might be hidden   PHQ-2/9 Result reviewed GAD 7 : Generalized Anxiety Score 11/01/2019 12/28/2018 05/18/2018 02/15/2018  Nervous, Anxious, on Edge 1 2 3 3   Control/stop worrying 2 3 3 2   Worry too much - different things 2 3 3 2   Trouble relaxing 2 3 3 3   Restless 0 0 3 3  Easily annoyed or irritable 1 1 3 3   Afraid - awful might happen 0 0 3 3  Total GAD 7 Score 8 12 21 19   Anxiety Difficulty Somewhat difficult Somewhat difficult Extremely difficult Very difficult    Result reviewed  Fall Risk: Fall Risk  11/01/2019 03/19/2019 12/28/2018 11/16/2018 05/18/2018  Falls in the past year? 1 1 0 1 0  Number falls in past yr: 1 0 0 1 0  Injury with Fall? 0 0  0 0 0  Risk for fall due to : History of fall(s) - - - -  Risk for  fall due to: Comment - - - - -  Follow up Falls evaluation completed Falls evaluation completed Falls evaluation completed Falls evaluation completed Falls evaluation completed      Objective:   Vitals:   11/01/19 0814  BP: 128/74  Pulse: 100  Resp: 18  Temp: 97.9 F (36.6 C)  TempSrc: Temporal  SpO2: 99%  Weight: 233 lb 11.2 oz (106 kg)  Height: 5\' 9"  (1.753 m)    Body mass index is 34.51 kg/m.  Physical Exam   NAD, masked, pleasant HEENT - [place]/AT, sclera anicteric, positive glasses, left eye pupil abnormal and not reactive, right eye pupil reactive, conj - non-inj'ed,  pharynx clear Neck - supple, no adenopathy, no TM, no JVD Car - RRR without m/g/r Pulm- RR and effort normal at rest, CTA without wheeze or rales Abd - soft, NT, obese, ND, BS+,  no obvious masses Back - no CVA tenderness Ext -1+ bilateral LE edema that extends into the feet bilaterally, no calf erythema or tenderness Diabetic foot exam: No skin breakdown, ulcers Adequate DP pulses Noted minimal sensation bilateral to light touch  Monofilament testing was abnormal with inability to feel the monofilament on the plantar aspect of both feet Neuro/psychiatric - affect was not flat, appropriate with conversation  Alert and oriented  Grossly non-focal -adequate strength on testing extremities,  Speech  normal   Results for orders placed or performed in visit on 11/16/18  COMPLETE METABOLIC PANEL WITH GFR  Result Value Ref Range   Glucose, Bld 270 (H) 65 - 99 mg/dL   BUN 13 7 - 25 mg/dL   Creat 11/18/18 1.91 - 4.78 mg/dL   GFR, Est Non African American 89 > OR = 60 mL/min/1.53m2   GFR, Est African American 103 > OR = 60 mL/min/1.19m2   BUN/Creatinine Ratio NOT APPLICABLE 6 - 22 (calc)   Sodium 136 135 - 146 mmol/L   Potassium 4.3 3.5 - 5.3 mmol/L   Chloride 104 98 - 110 mmol/L   CO2 25 20 - 32 mmol/L   Calcium 9.7 8.6 - 10.3 mg/dL   Total  Protein 6.8 6.1 - 8.1 g/dL   Albumin 3.9 3.6 - 5.1 g/dL   Globulin 2.9 1.9 - 3.7 g/dL (calc)   AG Ratio 1.3 1.0 - 2.5 (calc)   Total Bilirubin 0.4 0.2 - 1.2 mg/dL   Alkaline phosphatase (APISO) 66 35 - 144 U/L   AST 10 10 - 35 U/L   ALT 7 (L) 9 - 46 U/L  CBC with Differential/Platelet  Result Value Ref Range   WBC 6.2 3.8 - 10.8 Thousand/uL   RBC 3.93 (L) 4.20 - 5.80 Million/uL   Hemoglobin 12.6 (L) 13.2 - 17.1 g/dL   HCT 75m (L) 38 - 50 %   MCV 92.6 80.0 - 100.0 fL   MCH 32.1 27.0 - 33.0 pg   MCHC 34.6 32.0 - 36.0 g/dL   RDW 62.1 30.8 - 65.7 %   Platelets 220 140 - 400 Thousand/uL   MPV 10.9 7.5 - 12.5 fL   Neutro Abs 4,334 1,500 - 7,800 cells/uL   Lymphs Abs 1,221 850 - 3,900 cells/uL   Absolute Monocytes 428 200 - 950 cells/uL   Eosinophils Absolute 180 15 - 500 cells/uL   Basophils Absolute 37 0 - 200 cells/uL   Neutrophils Relative % 69.9 %   Total Lymphocyte 19.7 %   Monocytes Relative 6.9 %   Eosinophils Relative 2.9 %   Basophils Relative 0.6 %  Lipid  panel  Result Value Ref Range   Cholesterol 210 (H) <200 mg/dL   HDL 33 (L) > OR = 40 mg/dL   Triglycerides 462 (H) <150 mg/dL   LDL Cholesterol (Calc) 122 (H) mg/dL (calc)   Total CHOL/HDL Ratio 6.4 (H) <5.0 (calc)   Non-HDL Cholesterol (Calc) 177 (H) <130 mg/dL (calc)  Hemoglobin V0J  Result Value Ref Range   Hgb A1c MFr Bld 7.5 (H) <5.7 % of total Hgb   Mean Plasma Glucose 169 (calc)   eAG (mmol/L) 9.3 (calc)  Microalbumin / creatinine urine ratio  Result Value Ref Range   Creatinine, Urine 121 20 - 320 mg/dL   Microalb, Ur 50.0 mg/dL   Microalb Creat Ratio 450 (H) <30 mcg/mg creat  PSA  Result Value Ref Range   PSA 0.1 < OR = 4.0 ng/mL   Last labs reviewed, from about a year ago    Assessment & Plan:   1. Anxiety Still struggles some with anxiety issues, and remains on the buspirone. Noted has only been taking it about twice a day. He does think the trazodone has been helpful at bedtime for sleep  issues, and requested a refill today which was done. His GAD was reviewed. He has been tried on numerous other medicines in the past to help, and not noted much benefit, and we will hold on taking another medicine to try at this point. He does note the trazodone has been helpful. - traZODone (DESYREL) 50 MG tablet; Take 0.5-1 tablets (25-50 mg total) by mouth at bedtime as needed for sleep.  Dispense: 90 tablet; Refill: 1 - COMPLETE METABOLIC PANEL WITH GFR - CBC with Differential/Platelet  2. Current moderate episode of major depressive disorder without prior episode [facility]) His PHQ-9 today was reviewed, and has not had a significant increase in depressive symptoms, notes he gets frustrated with his overall health at times, and this contributes. Continue to monitor. - traZODone (DESYREL) 50 MG tablet; Take 0.5-1 tablets (25-50 mg total) by mouth at bedtime as needed for sleep.  Dispense: 90 tablet; Refill: 1  3. Insomnia, unspecified type As above, helped with the trazodone - traZODone (DESYREL) 50 MG tablet; Take 0.5-1 tablets (25-50 mg total) by mouth at bedtime as needed for sleep.  Dispense: 90 tablet; Refill: 1  4. Type 2 diabetes mellitus with hyperlipidemia (HCC) Has been a while since labs have been checked, and do need to check today and he agreed. He has not been checking his sugars at home. Taking Metformin twice daily and to continue. He did have microalbumin on last check, and the ACE inhibitor was stopped by neurology due to blood pressure concerns. Noted that would be helpful in protecting the kidneys over time. Felt best to continue off of the ACE inhibitor presently. We will recheck labs today. - COMPLETE METABOLIC PANEL WITH GFR - Lipid panel - Hemoglobin A1c - Microalbumin / creatinine urine ratio - CBC with Differential/Platelet  5. Class 1 obesity due to excess calories with serious comorbidity and body mass index (BMI) of 34.0 to 34.9 in adult His weight is down some  from a year ago. Continuing to monitor.  6. Mixed hyperlipidemia Remains on a statin product and Zetia We will recheck a lipid panel - COMPLETE METABOLIC PANEL WITH GFR - Lipid panel  7. Swelling of both lower extremities He notes his legs and feet have been a little more swollen in the recent past, more after on his feet a lot. Discussed many potential causes,  and will check some labs today. Recommended keeping his legs elevated frequently, especially at the end of the day to help. Compression stockings are hard to do presently due to the heat. Did note that is one entity that may help as well We will not add a diuretic presently as I await lab results and discussed some concerns with adding diuretics presently, noting how it can affect his metabolic status, and also his blood pressure. - COMPLETE METABOLIC PANEL WITH GFR - CBC with Differential/Platelet  8. Carotid artery stenosis, symptomatic, left Continues to follow-up with vascular  9. Right sided weakness Has been stable, not progressed. Continue to follow with vascular Also continue his medication regimen  10. Vision loss of left eye Continues to follow with the eye doctor, and also with vascular  11. Tobacco dependence He has increased his tobacco use again, and is strongly encouraged trying to lessen again and the importance of that.  Await lab results presently, with a follow-up tentatively scheduled in 3 to 4 months time, follow-up sooner as needed. Did recommend a follow-up with vascular again in 6 months after their visit in February as they recommended, and I did not see a visit scheduled. Did ask him to contact them to schedule that follow-up as continuing with their input is important over time.  Addendum: Later today after his visit, the patient called and requested a refill for his Metformin, and also for Lexapro.  He apparently has been taking Lexapro 20 mg-once daily in addition to the above to help with his  anxiety Did refill the medicines requested.   Jamelle Haring, MD 11/01/19 8:43 AM

## 2019-10-31 NOTE — Telephone Encounter (Signed)
Spoke with pt and appt is scheduled with Dr Dorris Fetch

## 2019-11-01 ENCOUNTER — Other Ambulatory Visit: Payer: Self-pay

## 2019-11-01 ENCOUNTER — Ambulatory Visit: Payer: BLUE CROSS/BLUE SHIELD | Admitting: Internal Medicine

## 2019-11-01 ENCOUNTER — Encounter: Payer: Self-pay | Admitting: Internal Medicine

## 2019-11-01 VITALS — BP 128/74 | HR 100 | Temp 97.9°F | Resp 18 | Ht 69.0 in | Wt 233.7 lb

## 2019-11-01 DIAGNOSIS — E1169 Type 2 diabetes mellitus with other specified complication: Secondary | ICD-10-CM | POA: Diagnosis not present

## 2019-11-01 DIAGNOSIS — R531 Weakness: Secondary | ICD-10-CM

## 2019-11-01 DIAGNOSIS — F17200 Nicotine dependence, unspecified, uncomplicated: Secondary | ICD-10-CM

## 2019-11-01 DIAGNOSIS — G4700 Insomnia, unspecified: Secondary | ICD-10-CM | POA: Diagnosis not present

## 2019-11-01 DIAGNOSIS — M7989 Other specified soft tissue disorders: Secondary | ICD-10-CM | POA: Diagnosis not present

## 2019-11-01 DIAGNOSIS — E782 Mixed hyperlipidemia: Secondary | ICD-10-CM | POA: Diagnosis not present

## 2019-11-01 DIAGNOSIS — E66811 Obesity, class 1: Secondary | ICD-10-CM

## 2019-11-01 DIAGNOSIS — H5462 Unqualified visual loss, left eye, normal vision right eye: Secondary | ICD-10-CM

## 2019-11-01 DIAGNOSIS — F419 Anxiety disorder, unspecified: Secondary | ICD-10-CM

## 2019-11-01 DIAGNOSIS — F321 Major depressive disorder, single episode, moderate: Secondary | ICD-10-CM

## 2019-11-01 DIAGNOSIS — E785 Hyperlipidemia, unspecified: Secondary | ICD-10-CM

## 2019-11-01 DIAGNOSIS — E6609 Other obesity due to excess calories: Secondary | ICD-10-CM | POA: Diagnosis not present

## 2019-11-01 DIAGNOSIS — Z6834 Body mass index (BMI) 34.0-34.9, adult: Secondary | ICD-10-CM

## 2019-11-01 DIAGNOSIS — I6522 Occlusion and stenosis of left carotid artery: Secondary | ICD-10-CM | POA: Diagnosis not present

## 2019-11-01 DIAGNOSIS — R69 Illness, unspecified: Secondary | ICD-10-CM | POA: Diagnosis not present

## 2019-11-01 IMAGING — CT CT ANGIO HEAD
2 of 11 series · 7 of 35 positions shown · IV contrast (APPLIED)
Comparison: None.

ADDENDUM:
Study discussed by telephone with Dr. Cebi Genade on 01/23/2018 at 1506
hours.
CLINICAL DATA: 62-year-old male with 3 weeks of left eye vision
loss. Carotid stenosis. Ocular ischemic syndrome.

EXAM:
CT ANGIOGRAPHY HEAD AND NECK
TECHNIQUE: Multidetector CT imaging of the head and neck was performed using
the standard protocol during bolus administration of intravenous
contrast. Multiplanar CT image reconstructions and MIPs were
obtained to evaluate the vascular anatomy. Carotid stenosis
measurements (when applicable) are obtained utilizing NASCET
criteria, using the distal internal carotid diameter as the
denominator.
CONTRAST:  75mL OMNIPAQUE IOHEXOL 350 MG/ML SOLN

[Series 8: cta head neck · axial · 0.45mm/px · z∈[-262,-146]mm · 2 of 175 slices shown]
[im 59/175  soft-tissue]
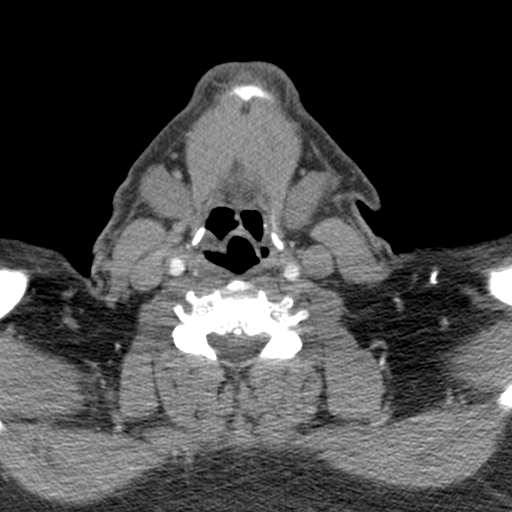
[im 117/175  soft-tissue]
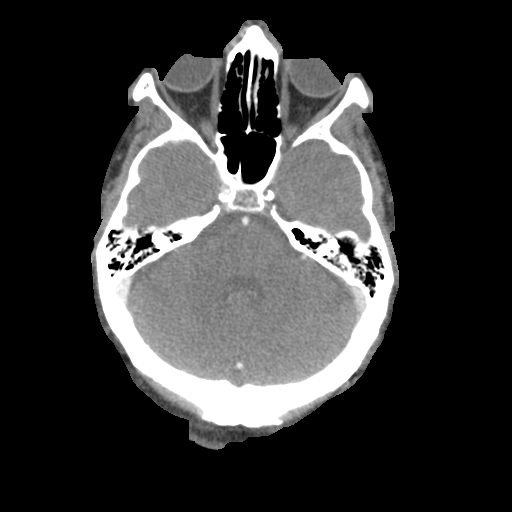

[Series 10: ax thin · axial · 0.52mm/px · z∈[-318,-86]mm · 5 of 350 slices shown]
[im 59/350  soft-tissue]
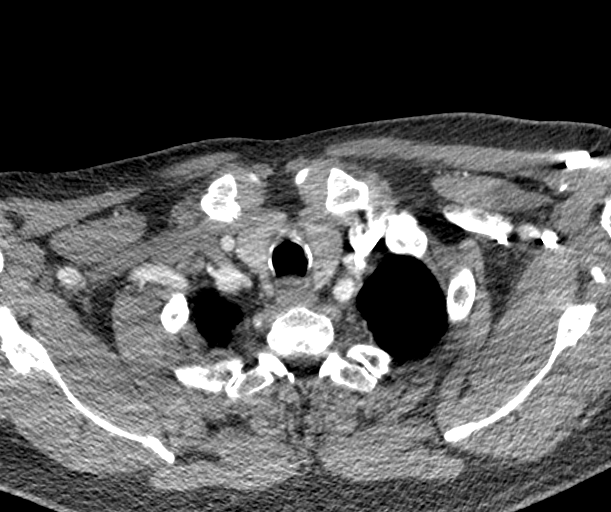
[im 117/350  bone]
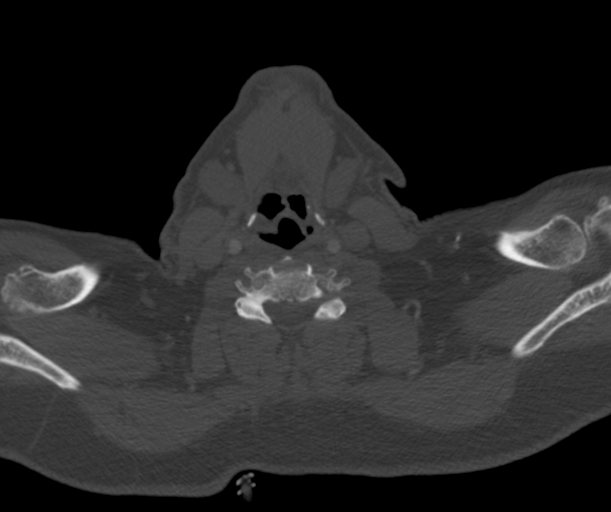
[im 175/350  soft-tissue]
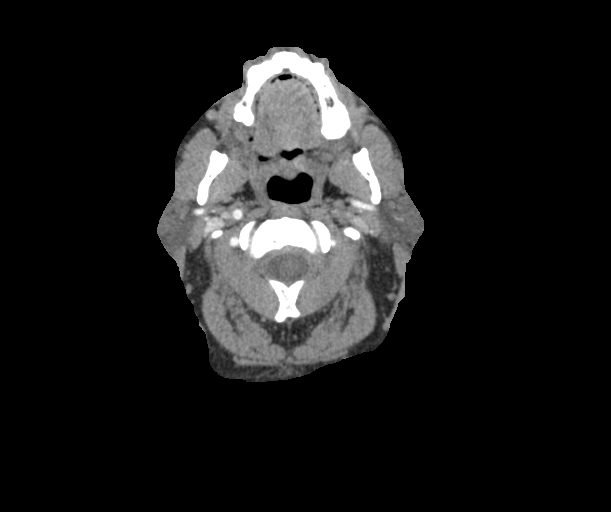
[im 233/350  bone]
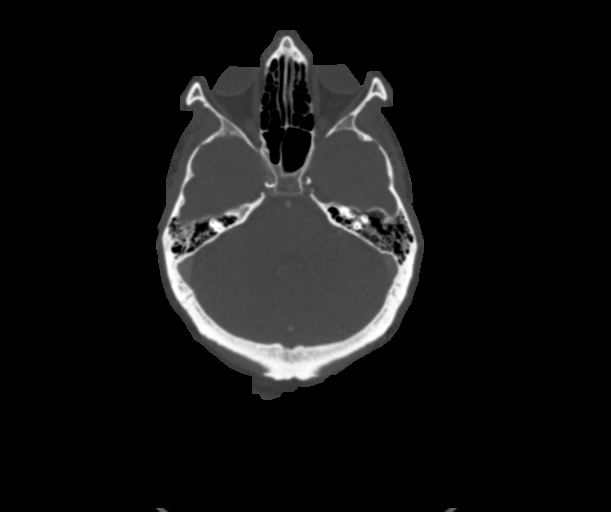
[im 291/350  soft-tissue]
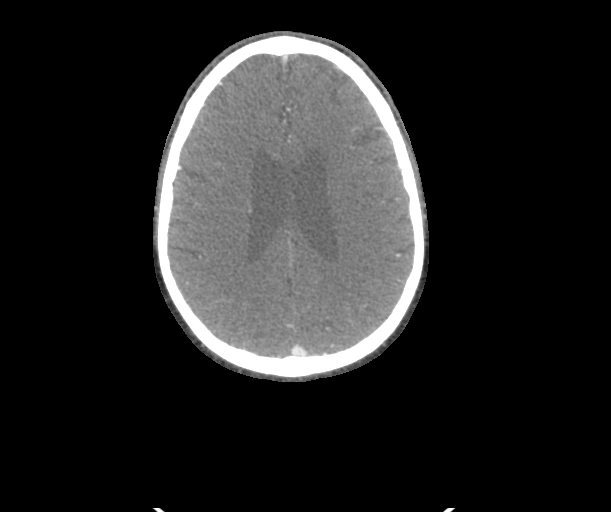

[7 of 35 positions shown; findings below may reference images not displayed]

FINDINGS: CT HEAD

Brain: No midline shift, ventriculomegaly, mass effect, evidence of
mass lesion, intracranial hemorrhage or evidence of cortically based
acute infarction. No cortical encephalomalacia identified. Mild for
age cerebral white matter hypodensity, otherwise gray-white matter
differentiation is within normal limits.

Calvarium and skull base: Negative.

Paranasal sinuses: Clear.

Orbits: The orbits soft tissues appear symmetric and normal.
Visualized scalp soft tissues are within normal limits.

CTA NECK

Skeleton: Carious posterior right mandible dentition. No acute
osseous abnormality identified.

Upper chest: Possible upper lobe centrilobular emphysema. No
superior mediastinal lymphadenopathy.

Other neck: Negative; no neck mass or lymphadenopathy.

Aortic arch: 3 vessel arch configuration. Mild to moderate arch
atherosclerosis with soft and calcified plaque.

Right carotid system: No brachiocephalic or right CCA origin
stenosis. Soft and calcified plaque at the right ICA origin and bulb
resulting in less than 50 % stenosis with respect to the distal
vessel.

Left carotid system: No left CCA origin stenosis. Patent left CCA to
the bifurcation. However, the left ICA is occluded in the bulb just
beyond the origin see series 14, image 24. No reconstituted
enhancement in the cervical left ICA.

Vertebral arteries:
No significant proximal right subclavian artery stenosis despite
origin plaque. Normal right vertebral artery origin. The right
vertebral artery is dominant and patent to the skull base without
stenosis.

No significant proximal left subclavian artery stenosis despite soft
and calcified plaque. Normal left vertebral artery origin. Tortuous
left V1 segment. The left vertebral artery is non dominant although
still a relatively normal sized to the skull base.

CTA HEAD

Posterior circulation: Minimal vertebral V4 segment plaque. Dominant
distal right vertebral artery. Normal PICA origins. The left
vertebral is somewhat diminutive beyond the PICA. Patent
vertebrobasilar junction and basilar artery without stenosis. Normal
SCA and left PCA origins. Fetal type right PCA origin. Bilateral PCA
branches are within normal limits.

Anterior circulation: No enhancement of the left ICA siphon through
the proximal cavernous segment. Reconstituted enhancement in the
distal cavernous, anterior genu and left supraclinoid ICA segments.
The left ophthalmic artery origin is patent and enhancing on series
12, image 185. A small left posterior communicating artery is
present and enhancing.

The contralateral right ICA siphon is patent with severe calcified
atherosclerosis. There is moderate to severe stenosis in the right
ICA distal cavernous segment and anterior genu. The right ophthalmic
and posterior communicating artery origins remain normal.

Patent carotid termini. MCA origins, MCA M1 segments, and MCA
bifurcations appear patent and symmetrically enhancing. No MCA
stenosis is identified.

There is severe stenosis at the left ACA origin. The distal A1
segment has a more normal appearance. The anterior communicating
artery is present. There is subtle decreased enhancement of the left
A2 segment but otherwise the ACA branches are within normal limits.

Venous sinuses: Patent.

Anatomic variants: Dominant right vertebral artery. Fetal type right
PCA origin.

Delayed phase: No abnormal enhancement identified.

Review of the MIP images confirms the above findings
IMPRESSION: 1. Positive for Left ICA occlusion from the origin through the mid
left ICA siphon.
The distal Left ICA is reconstituted beginning in the cavernous
segment, and the Left Ophthalmic Artery origin remains patent
(series 12, image 185).
2. No significant contralateral Right Carotid stenosis in the neck,
but up to Severe Right ICA siphon stenosis at the distal cavernous
segment due to bulky calcified plaque.
3. Severe stenosis at the left ACA origin although only mildly
decreased left A2 enhancement compared to the right. Bilateral MCA
enhancement is symmetric and within normal limits.
4. No vertebral artery or posterior circulation stenosis. The Right
Vertebral is dominant.
5. Mild for age cerebral white matter changes typically due to small
vessel disease. No acute or chronic cerebral cortically based
infarct identified.
6.  Aortic Atherosclerosis ([id number]-ED6.6).

## 2019-11-01 MED ORDER — ESCITALOPRAM OXALATE 20 MG PO TABS: 20.0000 mg | ORAL_TABLET | Freq: Every day | ORAL | 1 refills | Status: DC

## 2019-11-01 MED ORDER — TRAZODONE HCL 50 MG PO TABS: 25.0000 mg | ORAL_TABLET | Freq: Every evening | ORAL | 1 refills | Status: DC | PRN

## 2019-11-01 MED ORDER — METFORMIN HCL 1000 MG PO TABS: ORAL_TABLET | ORAL | 3 refills | Status: DC

## 2019-11-01 NOTE — Patient Instructions (Signed)
Type 2 Diabetes Mellitus, Self Care, Adult When you have type 2 diabetes (type 2 diabetes mellitus), you must make sure your blood sugar (glucose) stays in a healthy range. You can do this with:  Nutrition.  Exercise.  Lifestyle changes.  Medicines or insulin, if needed.  Support from your doctors and others. How to stay aware of blood sugar   Check your blood sugar level every day, as often as told.  Have your A1c (hemoglobin A1c) level checked two or more times a year. Have it checked more often if your doctor tells you to. Your doctor will set personal treatment goals for you. Generally, you should have these blood sugar levels:  Before meals (preprandial): 80-130 mg/dL (4.4-7.2 mmol/L).  After meals (postprandial): below 180 mg/dL (10 mmol/L).  A1c level: less than 7%. How to manage high and low blood sugar Signs of high blood sugar High blood sugar is called hyperglycemia. Know the signs of high blood sugar. Signs may include:  Feeling: ? Thirsty. ? Hungry. ? Very tired.  Needing to pee (urinate) more than usual.  Blurry vision. Signs of low blood sugar Low blood sugar is called hypoglycemia. This is when blood sugar is at or below 70 mg/dL (3.9 mmol/L). Signs may include:  Feeling: ? Hungry. ? Worried or nervous (anxious). ? Sweaty and clammy. ? Confused. ? Dizzy. ? Sleepy. ? Sick to your stomach (nauseous).  Having: ? A fast heartbeat. ? A headache. ? A change in your vision. ? Jerky movements that you cannot control (seizure). ? Tingling or no feeling (numbness) around your mouth, lips, or tongue.  Having trouble with: ? Moving (coordination). ? Sleeping. ? Passing out (fainting). ? Getting upset easily (irritability). Treating low blood sugar To treat low blood sugar, eat or drink something sugary right away. If you can think clearly and swallow safely, follow the 15:15 rule:  Take 15 grams of a fast-acting carb (carbohydrate). Talk with your  doctor about how much you should take.  Some fast-acting carbs are: ? Sugar tablets (glucose pills). Take 3-4 pills. ? 6-8 pieces of hard candy. ? 4-6 oz (120-150 mL) of fruit juice. ? 4-6 oz (120-150 mL) of regular (not diet) soda. ? 1 Tbsp (15 mL) honey or sugar.  Check your blood sugar 15 minutes after you take the carb.  If your blood sugar is still at or below 70 mg/dL (3.9 mmol/L), take 15 grams of a carb again.  If your blood sugar does not go above 70 mg/dL (3.9 mmol/L) after 3 tries, get help right away.  After your blood sugar goes back to normal, eat a meal or a snack within 1 hour. Treating very low blood sugar If your blood sugar is at or below 54 mg/dL (3 mmol/L), you have very low blood sugar (severe hypoglycemia). This is an emergency. Do not wait to see if the symptoms will go away. Get medical help right away. Call your local emergency services (911 in the [place] If you have very low blood sugar and you cannot eat or drink, you may need a glucagon shot (injection). A family member or friend should learn how to check your blood sugar and how to give you a glucagon shot. Ask your doctor if you need to have a glucagon shot kit at home. Follow these instructions at home: Medicine  Take insulin and diabetes medicines as told.  If your doctor says you should take more or less insulin and medicines, do this exactly as told.  Do not run out of insulin or medicines. Having diabetes can raise your risk for other long-term conditions. These include heart disease and kidney disease. Your doctor may prescribe medicines to help you not have these problems. Food   Make healthy food choices. These include: ? Chicken, fish, egg whites, and beans. ? Oats, whole wheat, bulgur, brown rice, quinoa, and millet. ? Fresh fruits and vegetables. ? Low-fat dairy products. ? Nuts, avocado, olive oil, and canola oil.  Meet with a food specialist (dietitian). He or she can help you make an  eating plan that is right for you.  Follow instructions from your doctor about what you cannot eat or drink.  Drink enough fluid to keep your pee (urine) pale yellow.  Keep track of carbs that you eat. Do this by reading food labels and learning food serving sizes.  Follow your sick day plan when you cannot eat or drink normally. Make this plan with your doctor so it is ready to use. Activity  Exercise 3 or more times a week.  Do not go more than 2 days without exercising.  Talk with your doctor before you start a new exercise. Your doctor may need to tell you to change: ? How much insulin or medicines you take. ? How much food you eat. Lifestyle  Do not use any tobacco products. These include cigarettes, chewing tobacco, and e-cigarettes. If you need help quitting, ask your doctor.  Ask your doctor how much alcohol is safe for you.  Learn to deal with stress. If you need help with this, ask your doctor. Body care   Stay up to date with your shots (immunizations).  Have your eyes and feet checked by a doctor as often as told.  Check your skin and feet every day. Check for cuts, bruises, redness, blisters, or sores.  Brush your teeth and gums two times a day. Floss one or more times a day.  Go to the dentist one or more times every 6 months.  Stay at a healthy weight. General instructions  Take over-the-counter and prescription medicines only as told by your doctor.  Share your diabetes care plan with: ? Your work or school. ? People you live with.  Carry a card or wear jewelry that says you have diabetes.  Keep all follow-up visits as told by your doctor. This is important. Questions to ask your doctor  Do I need to meet with a diabetes educator?  Where can I find a support group for people with diabetes? Where to find more information To learn more about diabetes, visit:  American Diabetes Association: www.diabetes.org  American Association of Diabetes  Educators: www.diabeteseducator.org Summary  When you have type 2 diabetes, you must make sure your blood sugar (glucose) stays in a healthy range.  Check your blood sugar every day, as often as told.  Having diabetes can raise your risk for other conditions. Your doctor may prescribe medicines to help you not have these problems.  Keep all follow-up visits as told by your doctor. This is important. This information is not intended to replace advice given to you by your health care provider. Make sure you discuss any questions you have with your health care provider. Document Revised: 10/03/2017 Document Reviewed: 05/16/2015 Elsevier Patient Education  2020 [employer]   Diabetes Mellitus and Nutrition, Adult When you have diabetes (diabetes mellitus), it is very important to have healthy eating habits because your blood sugar (glucose) levels are greatly affected by what you   eat and drink. Eating healthy foods in the appropriate amounts, at about the same times every day, can help you:  Control your blood glucose.  Lower your risk of heart disease.  Improve your blood pressure.  Reach or maintain a healthy weight. Every person with diabetes is different, and each person has different needs for a meal plan. Your health care provider may recommend that you work with a diet and nutrition specialist (dietitian) to make a meal plan that is best for you. Your meal plan may vary depending on factors such as:  The calories you need.  The medicines you take.  Your weight.  Your blood glucose, blood pressure, and cholesterol levels.  Your activity level.  Other health conditions you have, such as heart or kidney disease. How do carbohydrates affect me? Carbohydrates, also called carbs, affect your blood glucose level more than any other type of food. Eating carbs naturally raises the amount of glucose in your blood. Carb counting is a method for keeping track of how many carbs you  eat. Counting carbs is important to keep your blood glucose at a healthy level, especially if you use insulin or take certain oral diabetes medicines. It is important to know how many carbs you can safely have in each meal. This is different for every person. Your dietitian can help you calculate how many carbs you should have at each meal and for each snack. Foods that contain carbs include:  Bread, cereal, rice, pasta, and crackers.  Potatoes and corn.  Peas, beans, and lentils.  Milk and yogurt.  Fruit and juice.  Desserts, such as cakes, cookies, ice cream, and candy. How does alcohol affect me? Alcohol can cause a sudden decrease in blood glucose (hypoglycemia), especially if you use insulin or take certain oral diabetes medicines. Hypoglycemia can be a life-threatening condition. Symptoms of hypoglycemia (sleepiness, dizziness, and confusion) are similar to symptoms of having too much alcohol. If your health care provider says that alcohol is safe for you, follow these guidelines:  Limit alcohol intake to no more than 1 drink per day for nonpregnant women and 2 drinks per day for men. One drink equals 12 oz of beer, 5 oz of wine, or 1 oz of hard liquor.  Do not drink on an empty stomach.  Keep yourself hydrated with water, diet soda, or unsweetened iced tea.  Keep in mind that regular soda, juice, and other mixers may contain a lot of sugar and must be counted as carbs. What are tips for following this plan?  Reading food labels  Start by checking the serving size on the "Nutrition Facts" label of packaged foods and drinks. The amount of calories, carbs, fats, and other nutrients listed on the label is based on one serving of the item. Many items contain more than one serving per package.  Check the total grams (g) of carbs in one serving. You can calculate the number of servings of carbs in one serving by dividing the total carbs by 15. For example, if a food has 30 g of total  carbs, it would be equal to 2 servings of carbs.  Check the number of grams (g) of saturated and trans fats in one serving. Choose foods that have low or no amount of these fats.  Check the number of milligrams (mg) of salt (sodium) in one serving. Most people should limit total sodium intake to less than 2,300 mg per day.  Always check the nutrition information of foods labeled   as "low-fat" or "nonfat". These foods may be higher in added sugar or refined carbs and should be avoided.  Talk to your dietitian to identify your daily goals for nutrients listed on the label. Shopping  Avoid buying canned, premade, or processed foods. These foods tend to be high in fat, sodium, and added sugar.  Shop around the outside edge of the grocery store. This includes fresh fruits and vegetables, bulk grains, fresh meats, and fresh dairy. Cooking  Use low-heat cooking methods, such as baking, instead of high-heat cooking methods like deep frying.  Cook using healthy oils, such as olive, canola, or sunflower oil.  Avoid cooking with butter, cream, or high-fat meats. Meal planning  Eat meals and snacks regularly, preferably at the same times every day. Avoid going long periods of time without eating.  Eat foods high in fiber, such as fresh fruits, vegetables, beans, and whole grains. Talk to your dietitian about how many servings of carbs you can eat at each meal.  Eat 4-6 ounces (oz) of lean protein each day, such as lean meat, chicken, fish, eggs, or tofu. One oz of lean protein is equal to: ? 1 oz of meat, chicken, or fish. ? 1 egg. ?  cup of tofu.  Eat some foods each day that contain healthy fats, such as avocado, nuts, seeds, and fish. Lifestyle  Check your blood glucose regularly.  Exercise regularly as told by your health care provider. This may include: ? 150 minutes of moderate-intensity or vigorous-intensity exercise each week. This could be brisk walking, biking, or water  aerobics. ? Stretching and doing strength exercises, such as yoga or weightlifting, at least 2 times a week.  Take medicines as told by your health care provider.  Do not use any products that contain nicotine or tobacco, such as cigarettes and e-cigarettes. If you need help quitting, ask your health care provider.  Work with a Social worker or diabetes educator to identify strategies to manage stress and any emotional and social challenges. Questions to ask a health care provider  Do I need to meet with a diabetes educator?  Do I need to meet with a dietitian?  What number can I call if I have questions?  When are the best times to check my blood glucose? Where to find more information:  American Diabetes Association: diabetes.org  Academy of Nutrition and Dietetics: www.eatright.[employer] of Diabetes and Digestive and Kidney Diseases (NIH): [employer] Summary  A healthy meal plan will help you control your blood glucose and maintain a healthy lifestyle.  Working with a diet and nutrition specialist (dietitian) can help you make a meal plan that is best for you.  Keep in mind that carbohydrates (carbs) and alcohol have immediate effects on your blood glucose levels. It is important to count carbs and to use alcohol carefully. This information is not intended to replace advice given to you by your health care provider. Make sure you discuss any questions you have with your health care provider. Document Revised: 03/25/2017 Document Reviewed: 05/17/2016 Elsevier Patient Education  2020 [employer].

## 2019-11-02 LAB — LIPID PANEL
Cholesterol: 206 mg/dL — ABNORMAL HIGH (ref <200)
HDL: 34 mg/dL — ABNORMAL LOW (ref >=40)
LDL Cholesterol (Calc): 137 mg/dL (calc) — ABNORMAL HIGH
Non-HDL Cholesterol (Calc): 172 mg/dL (calc) — ABNORMAL HIGH (ref <130)
Total CHOL/HDL Ratio: 6.1 (calc) — ABNORMAL HIGH (ref <5.0)
Triglycerides: 212 mg/dL — ABNORMAL HIGH (ref <150)

## 2019-11-02 LAB — CBC WITH DIFFERENTIAL/PLATELET
Absolute Monocytes: 521 cells/uL (ref 200–950)
Basophils Absolute: 34 cells/uL (ref 0–200)
Basophils Relative: 0.4 %
Eosinophils Absolute: 277 cells/uL (ref 15–500)
Eosinophils Relative: 3.3 %
HCT: 37.4 % — ABNORMAL LOW (ref 38.5–50.0)
Hemoglobin: 12.7 g/dL — ABNORMAL LOW (ref 13.2–17.1)
Lymphs Abs: 1621 cells/uL (ref 850–3900)
MCH: 30 pg (ref 27.0–33.0)
MCHC: 34 g/dL (ref 32.0–36.0)
MCV: 88.2 fL (ref 80.0–100.0)
MPV: 10.4 fL (ref 7.5–12.5)
Monocytes Relative: 6.2 %
Neutro Abs: 5947 cells/uL (ref 1500–7800)
Neutrophils Relative %: 70.8 %
Platelets: 276 10*3/uL (ref 140–400)
RBC: 4.24 10*6/uL (ref 4.20–5.80)
RDW: 13.6 % (ref 11.0–15.0)
Total Lymphocyte: 19.3 %
WBC: 8.4 10*3/uL (ref 3.8–10.8)

## 2019-11-02 LAB — COMPLETE METABOLIC PANEL WITH GFR
AG Ratio: 1.2 (calc) (ref 1.0–2.5)
ALT: 8 U/L — ABNORMAL LOW (ref 9–46)
AST: 12 U/L (ref 10–35)
Albumin: 4.2 g/dL (ref 3.6–5.1)
Alkaline phosphatase (APISO): 72 U/L (ref 35–144)
BUN: 17 mg/dL (ref 7–25)
CO2: 26 mmol/L (ref 20–32)
Calcium: 9.6 mg/dL (ref 8.6–10.3)
Chloride: 103 mmol/L (ref 98–110)
Creat: 0.99 mg/dL (ref 0.70–1.25)
GFR, Est African American: 95 mL/min/{1.73_m2} (ref >=60)
GFR, Est Non African American: 82 mL/min/{1.73_m2} (ref >=60)
Globulin: 3.4 g/dL (calc) (ref 1.9–3.7)
Glucose, Bld: 156 mg/dL — ABNORMAL HIGH (ref 65–99)
Potassium: 4.9 mmol/L (ref 3.5–5.3)
Sodium: 136 mmol/L (ref 135–146)
Total Bilirubin: 0.5 mg/dL (ref 0.2–1.2)
Total Protein: 7.6 g/dL (ref 6.1–8.1)

## 2019-11-02 LAB — MICROALBUMIN / CREATININE URINE RATIO
Creatinine, Urine: 228 mg/dL (ref 20–320)
Microalb Creat Ratio: 1059 mcg/mg creat — ABNORMAL HIGH (ref <30)
Microalb, Ur: 241.5 mg/dL

## 2019-11-02 LAB — HEMOGLOBIN A1C
Hgb A1c MFr Bld: 6.8 % of total Hgb — ABNORMAL HIGH (ref <5.7)
Mean Plasma Glucose: 148 (calc)
eAG (mmol/L): 8.2 (calc)

## 2019-11-02 NOTE — Progress Notes (Signed)
Daniel Snyder, The lab results from yesterday were reviewed. The complete metabolic panel showed that your blood sugar was high at 156, with the A1c improved at 6.8 (was 7.5 on last check about a year ago).  The remainder of the complete metabolic panel was normal with the only exception being one liver test, the ALT was just below the normal range and not a concern presently with all other liver function tests normal.  Importantly, the kidney function remains stable and the electrolytes were normal as well.  The urine for microalbumin showed continued presence of microalbumin in the urine.  The medicine to help with this (lisinopril) was stopped by neurology due to blood pressure concerns, and remaining off of this medicine presently was the plan.  Continuing to keeping your blood sugars controlled is important. A complete blood count showed that you are mildly anemic, although this is not new and has not changed over the past 3 years and the rest of the blood count entirely normal. The lipid panel remains abnormal with the cholesterol, triglyceride level, and LDL cholesterol (lousy type) all above the desired ranges and the HDL cholesterol (healthy type) slightly below the desired range.  You are presently on rosuvastatin at 40 mg daily and ezetimibe at 10 mg daily to help with this.  Your last visit with cardiology was in February with a follow-up recommended in 6 months (which would be next month - August) and they noted checking your lipids at that visit.  These results will be available for your cardiologist to see, and I do think getting their input on an additional medicine to help improve your lipid panel, specifically to help lower your LDL cholesterol is needed and will be helpful.  Please make sure you have that follow-up with cardiology next month as they recommended after the last visit. Dr. Dorris Fetch

## 2019-11-05 ENCOUNTER — Telehealth: Payer: Self-pay

## 2019-11-05 NOTE — Telephone Encounter (Signed)
Copied from CRM [id number]. Topic: General - Inquiry >> Nov 05, 2019 11:19 AM Deborha Payment wrote: Reason for CRM: Patient states he had a missed call from office. Did not see anything in patients chart. Call back [phone]

## 2019-11-20 ENCOUNTER — Other Ambulatory Visit: Payer: Self-pay

## 2019-11-20 ENCOUNTER — Ambulatory Visit (INDEPENDENT_AMBULATORY_CARE_PROVIDER_SITE_OTHER): Payer: 59 | Admitting: Internal Medicine

## 2019-11-20 ENCOUNTER — Encounter: Payer: Self-pay | Admitting: Internal Medicine

## 2019-11-20 VITALS — BP 131/80 | Ht 69.0 in | Wt 235.0 lb

## 2019-11-20 DIAGNOSIS — E1169 Type 2 diabetes mellitus with other specified complication: Secondary | ICD-10-CM | POA: Diagnosis not present

## 2019-11-20 DIAGNOSIS — Z6834 Body mass index (BMI) 34.0-34.9, adult: Secondary | ICD-10-CM | POA: Diagnosis not present

## 2019-11-20 DIAGNOSIS — E6609 Other obesity due to excess calories: Secondary | ICD-10-CM | POA: Diagnosis not present

## 2019-11-20 DIAGNOSIS — R69 Illness, unspecified: Secondary | ICD-10-CM | POA: Diagnosis not present

## 2019-11-20 DIAGNOSIS — F17200 Nicotine dependence, unspecified, uncomplicated: Secondary | ICD-10-CM

## 2019-11-20 DIAGNOSIS — J40 Bronchitis, not specified as acute or chronic: Secondary | ICD-10-CM

## 2019-11-20 DIAGNOSIS — E785 Hyperlipidemia, unspecified: Secondary | ICD-10-CM

## 2019-11-20 MED ORDER — AZITHROMYCIN 250 MG PO TABS: ORAL_TABLET | ORAL | 0 refills | Status: DC

## 2019-11-20 NOTE — Progress Notes (Signed)
Name: Daniel Snyder   MRN: [id number]    DOB: 1956/02/03   Date:11/20/2019       Progress Note  Subjective  Chief Complaint  Chief Complaint  Patient presents with  . Cough    Started last Thursday 11/15/19, has not had a covid test or the covid vaccine  . Nasal Congestion    patient denies fever, vomitting, diarrhea, shortness of breath    I connected with  [name] on 11/20/19 at  2:20 PM EDT by telephone and verified that I am speaking with the correct person using two identifiers.  I discussed the limitations, risks, security and privacy concerns of performing an evaluation and management service by telephone and the availability of in person appointments. The patient expressed understanding and agreed to proceed. Staff also discussed with the patient that there may be a patient responsible charge related to this service. Patient Location: Work, wearing a mask Provider Location: [facility] Additional Individuals present: none  HPI Patient is a 64 year old male I last saw 11/01/2019 for a follow-up visit. That note was reviewed as was the communication after the labs were obtained. He follows up today with the above complaints by a telephone visit  He notes starting last Tuesday had a head cold, then Thursday developed a cough,  + cough, no marked production, unable to cough stuff up, does have more chest congestion concerns in the last couple days No marked SOB, no wheezing + fever Fri night and woke up with sweats, feeling feverish, not a documented temperature + Mild sore throat.  +sinus congestion, better last 48 hours, worried was more in his chest now No loss of smell, loss of taste No  N/V No muscle aches No marked loose stools/diarrhea No CP, passing out episodes Took Mucinex product to help to date  Comorbid conditions reviewed No asthma/ + COPD hx likely with tob history  + DM, + heart disease,  +obesity Tob - No tob since Sat, and 1PPD on and off since age  59  Not have Covid vaccine   Patient Active Problem List   Diagnosis Date Noted  . Tobacco dependence 11/01/2019  . Swelling of both lower extremities 11/01/2019  . Vision loss of left eye 11/01/2019  . Right sided weakness 03/19/2019  . Insomnia 03/19/2019  . Current moderate episode of major depressive disorder without prior episode (HCC) 03/19/2019  . Carotid artery stenosis, symptomatic, left 01/23/2018  . Class 1 obesity due to excess calories with serious comorbidity and body mass index (BMI) of 34.0 to 34.9 in adult 12/16/2017  . Mixed hyperlipidemia 08/11/2016  . Vitamin D deficiency 08/11/2016  . Type 2 diabetes mellitus with hyperlipidemia (HCC) 05/05/2016  . Paronychia of finger of right hand 03/09/2016  . Osteoarthritis of knee 09/08/2015  . Olecranon bursitis 09/08/2015  . Swelling of right elbow joint 08/27/2015  . Right knee pain 08/27/2015  . Numbness in feet 08/27/2015  . Anxiety 08/27/2015  . Rectal polyp     Past Surgical History:  Procedure Laterality Date  . COLONOSCOPY N/A 10/10/2014   Procedure: COLONOSCOPY;  Surgeon: [name], MD;  Location: [facility];  Service: Gastroenterology;  Laterality: N/A;  PT WANTS EARLY  . POLYPECTOMY  10/10/2014   Procedure: POLYPECTOMY;  Surgeon: [name], MD;  Location: [facility];  Service: Gastroenterology;;    Family History  Problem Relation Age of Onset  . COPD Mother   . Cancer Father   . Emphysema Father   .  Diabetes Sister   . Heart attack Brother   . Heart disease Paternal Grandfather   . Heart attack Paternal Grandfather     Social History   Tobacco Use  . Smoking status: Current Some Day Smoker    Packs/day: 0.25    Years: 37.00    Pack years: 9.25  . Smokeless tobacco: Never Used  . Tobacco comment: 1 cigarette daily  Substance Use Topics  . Alcohol use: Yes    Alcohol/week: 4.0 standard drinks    Types: 4 Cans of beer per week     Current Outpatient Medications:  .   busPIRone (BUSPAR) 10 MG tablet, Take 1 tablet (10 mg total) by mouth 3 (three) times daily., Disp: 270 tablet, Rfl: 1 .  clopidogrel (PLAVIX) 75 MG tablet, TAKE 1 TABLET BY MOUTH EVERY DAY, Disp: 90 tablet, Rfl: 1 .  DUREZOL 0.05 % EMUL, TAKE 1 DROP(S) IN LEFT EYE EVERY 2 HOURS (Patient not taking: Reported on 11/01/2019), Disp: , Rfl: 2 .  escitalopram (LEXAPRO) 20 MG tablet, Take 1 tablet (20 mg total) by mouth daily., Disp: 90 tablet, Rfl: 1 .  ezetimibe (ZETIA) 10 MG tablet, Take by mouth., Disp: , Rfl:  .  fluticasone (FLONASE) 50 MCG/ACT nasal spray, SPRAY 2 SPRAYS INTO EACH NOSTRIL EVERY DAY, Disp: 48 mL, Rfl: 2 .  glucose blood (CONTOUR NEXT TEST) test strip, USE AS INSTRUCTED TO CHECK BLOOD GLUCOSE ONCE DAILY*VERIFY METER WITH PT, NO ANSWER 10/30*NOTCOVERED (Patient not taking: Reported on 11/01/2019), Disp: 90 each, Rfl: 2 .  metFORMIN (GLUCOPHAGE) 1000 MG tablet, Take one tablet twice daily, Disp: 180 tablet, Rfl: 3 .  rosuvastatin (CRESTOR) 40 MG tablet, TAKE 1 TABLET BY MOUTH EVERY DAY IN THE EVENING, Disp: 90 tablet, Rfl: 0 .  traZODone (DESYREL) 50 MG tablet, Take 0.5-1 tablets (25-50 mg total) by mouth at bedtime as needed for sleep., Disp: 90 tablet, Rfl: 1  No Known Allergies  With staff assistance, above reviewed with the patient/caregiver today.  ROS: As per HPI, otherwise no specific complaints on a limited and focused system review   Objective  Virtual encounter, vitals not obtained.  Body mass index is 34.7 kg/m.  Physical Exam   Appears in NAD via conversation Pulmonary/Chest: No obvious respiratory distress. Speaking in complete sentences Neurological: Pt is alert and oriented, Speech is normal Psychiatric: Patient has a normal mood and affect, behavior is normal. Judgment and thought content normal.   No results found for this or any previous visit (from the past 72 hour(s)).  PHQ2/9: Depression screen [facility] 2/9 11/20/2019 11/01/2019 03/19/2019 12/28/2018 11/16/2018   Decreased Interest 0 3 0 3 3  Down, Depressed, Hopeless 0 0 0 1 0  PHQ - 2 Score 0 3 0 4 3  Altered sleeping 0 3 3 2 3   Tired, decreased energy 2 3 3 3 3   Change in appetite 0 0 0 1 1  Feeling bad or failure about yourself  0 0 0 0 0  Trouble concentrating 0 1 0 1 1  Moving slowly or fidgety/restless 0 0 0 0 0  Suicidal thoughts 0 0 0 0 -  PHQ-9 Score 2 10 6 11 11   Difficult doing work/chores Not difficult at all Somewhat difficult Somewhat difficult Somewhat difficult Somewhat difficult  Some recent data might be hidden   PHQ-2/9 Result reviewed  Fall Risk: Fall Risk  11/20/2019 11/01/2019 03/19/2019 12/28/2018 11/16/2018  Falls in the past year? 1 1 1  0 1  Number falls in  past yr: 1 1 0 0 1  Injury with Fall? 0 0 0 0 0  Risk for fall due to : - History of fall(s) - - -  Risk for fall due to: Comment - - - - -  Follow up - Falls evaluation completed Falls evaluation completed Falls evaluation completed Falls evaluation completed     Assessment & Plan  1. Bronchitis concern Noted concerns for a bronchitis with him today, and did feel adding a Z-Pak was indicated and was prescribed to his pharmacy. Also continue the Mucinex product to help as an expectorant. We will hold off on an oral steroid or an inhaler, as he has no concerns with shortness of breath or wheezing issues presently. Can use Tylenol products as needed for any low-grade temperatures if they recur. Also discussed concerns for possible Covid, with those cases increasing again, and he has not had the vaccine. Recommended he obtain a Covid test, and can get one at a local pharmacy like [facility], and he noted he would call them and get an appointment to do that and recommended he do so today if possible. Also recommended concerns with him being at work, although he notes he has few contacts with his work presently but still concerned with him potentially being infectious.  Recommended he not go to work until he has the Covid test  and is returned negative and his symptoms are better.  He was understanding of that recommendation. If the test returns positive, he should contact the office, and needs to remain isolated at that time. Also emphasized if symptoms acutely worsen, with increasing shortness of breath, higher fevers, any chest pains or more concerning associated symptoms, he needs to be seen more emergently in an emergency room setting and he was understanding of that.  - azithromycin (ZITHROMAX) 250 MG tablet; Take 2 today, and 1 a day for the next 4 days  Dispense: 6 tablet; Refill: 0  2. Tobacco dependence He has a long history of tobacco use, and likely has COPD as a result.   3. Type 2 diabetes mellitus with hyperlipidemia (HCC) Noting this as a comorbid risk factor with the note from his recent visit which included lab tests reviewed.  4. Class 1 obesity due to excess calories with serious comorbidity and body mass index (BMI) of 34.0 to 34.9 in adult Also a comorbid risk factor concern    I discussed the assessment and treatment plan with the patient. The patient was provided an opportunity to ask questions and all were answered. The patient agreed with the plan and demonstrated an understanding of the instructions.  Red flags and when to present for emergency care or RTC including fevers,  chest pain, shortness of breath, new/worsening/un-resolving symptoms reviewed with patient at time of visit.   The patient was advised to call back or seek an in-person evaluation if the symptoms worsen or if the condition fails to improve as anticipated.  I provided 20 minutes of non-face-to-face time during this encounter that included discussing at length patient's sx/history, pertinent pmhx, medications, treatment and follow up plan. This time also included the necessary documentation, orders, and chart review.  Jamelle Haring, MD

## 2019-11-21 DIAGNOSIS — Z1159 Encounter for screening for other viral diseases: Secondary | ICD-10-CM | POA: Diagnosis not present

## 2019-11-21 DIAGNOSIS — Z03818 Encounter for observation for suspected exposure to other biological agents ruled out: Secondary | ICD-10-CM | POA: Diagnosis not present

## 2020-01-22 DIAGNOSIS — Z6832 Body mass index (BMI) 32.0-32.9, adult: Secondary | ICD-10-CM | POA: Diagnosis not present

## 2020-01-22 DIAGNOSIS — E785 Hyperlipidemia, unspecified: Secondary | ICD-10-CM | POA: Diagnosis not present

## 2020-01-30 NOTE — Progress Notes (Signed)
Patient ID: Daniel Snyder, male    DOB: 1955/09/15, 64 y.o.   MRN: [id number]  PCP: Jamelle HaringHendrickson, Aavya Shafer D, MD  No chief complaint on file.   Subjective:   Daniel Snyder is a [age] y.o. male, presents to clinic with CC of the following:  No chief complaint on file.   HPI:  Patient is a 64 year old male His last visit was by telephone 11/20/2019 for bronchitis concern Follows up after our last follow-up visit visit 10/31/2019 Communication from labs after that visit included the following:             The complete metabolic panel showed that your blood sugar was high at 156, with the A1c improved at 6.8 (was 7.5 on last check about a year ago). The remainder of the complete metabolic panel was normal with the only exception being one liver test, the ALT was just below the normal range and not a concern presently with all other liver function tests normal. Importantly, the kidney function remains stable and the electrolytes were normal as well. The urine for microalbumin showed continued presence of microalbumin in the urine. The medicine to help with this (lisinopril) was stopped by neurology due to blood pressure concerns, and recently started again at a 5 mg dose by vascular/cardiology. Keeping blood sugars well controlled is also very important A complete blood count showed that you are mildly anemic, although this is not new and has not changed over the past 3 years and the rest of the blood count entirely normal. The lipid panel remains abnormal with the cholesterol, triglyceride level, and LDL cholesterol (lousy type) all above the desired ranges and the HDL cholesterol (healthy type) slightly below the desired range. You are presently on rosuvastatin at 40 mg daily and ezetimibe at 10 mg daily to help with this. Your last visit with cardiology was in February with a follow-up recommended in 6 months (which would be next month - August) and they noted checking your lipids at that  visit. These results will be available for your cardiologist to see, and I do think getting their input on an additional medicine to help improve your lipid panel, specifically to help lower your LDL cholesterol is needed and will be helpful. Please make sure you have that follow-up with cardiology next month as they recommended after the last visit.  Follows up today  All in all, feels good some days, other days notes feels a little fatigued.              He did follow-up with vascular/cardiology 01/22/2020 with the following assessment/plan noted from that visit                 1. Coronary artery atherosclerosis: Stress SPECT performed 06/15/2018 was low risk without MPI defects, though sensitivity was reduced due to increased gut uptake. No complaints of chest pain today.  - Continue DAPT with ASA and clopidogrel  2. Carotid artery disease / Recurrent spells: 100% occlusion of left ICA. Nonsignificant stenosis of right ICA. Patient is following with neurosurgery / neurology. Patient was planning on undergoing carotid artery revascularization but this was aborted, as right-sided disease was nonsignificant. Continue to have frequent but stable spells. Denies any new neurologic symptoms.  - Continue DAPT, tobacco cessation, and lipid management - Follow up with neuro  3. Hypertension , trending up after holding lisinopril. Today BP 162/73 mmHg, 130/73 mmHg on repeated measures,  - will restart lisinopril 5 mg qd.  4. Mixed hyperlipidemia, most recent LDL 70, triglycerides 266 - Continue rosuvastatin 40 mg daily - Continue Zetia 10 mg daily - Lipids at this visit --> LDL = 107  5. Claudication Patient reports bilateral below the knee cramping which is likely consistent with claudication given risk factors above. Symptoms occur with moderate exertion (Rutherford class II). Denies any new wounds or skin breakdown. -Arterial duplex on 06/2019 unremarkable -Antiplatelet as above. -Tobacco  cessation - Lipid management as above  6.Tobacco abuse -currently use 1/2 pack a day -reemphasis the Importance of smoking cessation.  Return in about 6 months (around 07/21/2020).   BP Readings from Last 3 Encounters:  02/01/20 (!) 150/70  11/20/19 (!) 131/80  11/01/19 128/74  He does check blood pressures at home and they have been running in the 145/70 range Denies CP, palp's, SOB, no HA's, notes his legs have been less swollen in the recent past, denies any bleeding including no bright red blood per rectum, no dark or black stools, no easy bruising.  Anxiety& Insomnia:Pt has been struggling with anxiety for some time, and over the last year plus, he has had some significant health issues that have worsened the anxiety. Meds taken in the past that were ineffective Xanax, hydroxyzine,effexor,Wellbutrin. Medication regimen-Lexapro 20 mg daily and buspar BID with a slight improvement in anxietynoted previously, also trazodone 1/2 to 1 tablet at bedtime.[name]  He notes his anxiety has not significantly changed, still struggles at times with anxiety, thinks managing fairly well presently with the medications helping, sleeping well with the trazodone helping.  Denies any marked increase in depressive symptoms, at times has some days where he is more down, denies any thoughts of hurting self or hurting others.  RIGHT sided weakness: course has been stablein recent past.Was diagnosed with 100% carotid artery blockage in the Fall of 2019, and was told RIGHT carotid was 60% blocked. Went to neuro in [facility] - saw Dr. Valora Piccolo who ran several testsincluding EEG - was told that there is a possibility that his BP is going too low for just a few seconds, causing the weakness.   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  Medication regimen-plavix, daily ASA, crestor , zetia-10 mg. Denies myalgias.   Noted has been off the plavix for a week as not refilled for him by vascular, and was to continue per their note. Will refill for him today  Tobacco Use:Was back up to 1pack aday noted last visit,encouraged trying to lessen again and has cut back since, about 1/2 PPD presently.  Encouraged to try to continue to lessen tobacco use  Diabetes mellitustype 2 Medication regimen - Metformin  in the am, was recommended twice daily prior although has not, and rec'ed to increase today to twice daily with the higher A1c noted today Takes medication regularly Checks sugars at home about twice daily, usually in afternoon, about 180 on average, asked that he check fasting once in the morning and record, and then bring them to the follow-up visits   Lab Results  Component Value Date   HGBA1C 6.8 (H) 11/01/2019   HGBA1C 7.5 (H) 11/16/2018   HGBA1C 6.5 (H) 05/18/2018   Lab Results  Component Value  Date   MICROALBUR 241.5 11/01/2019   LDLCALC 137 (H) 11/01/2019   CREATININE 0.99 11/01/2019    Denies Polyuria, polydipsia,  does drink a lot of water noted  ACEI/ARB:Was taken off to allow permissive HTN by Dr. Valora Piccolo with neurology, back on low-dose lisinopril Statin:Yes  Obesity Wt Readings from Last 3 Encounters:  02/01/20 228 lb 1.6 oz (103.5 kg)  11/20/19 (!) 235 lb (106.6 kg)  11/01/19 233 lb 11.2 oz (106 kg)   Weight has decreased intentionally since July visit.  Trying his best to eat healthy, stay well-hydrated.    Patient Active Problem List   Diagnosis Date Noted  . Tobacco dependence 11/01/2019  . Swelling of both lower extremities 11/01/2019  . Vision loss of left eye 11/01/2019  . Right  sided weakness 03/19/2019  . Insomnia 03/19/2019  . Current moderate episode of major depressive disorder without prior episode (HCC) 03/19/2019  . Carotid artery stenosis, symptomatic, left 01/23/2018  . Class 1 obesity due to excess calories with serious comorbidity and body mass index (BMI) of 34.0 to 34.9 in adult 12/16/2017  . Mixed hyperlipidemia 08/11/2016  . Vitamin D deficiency 08/11/2016  . Type 2 diabetes mellitus with hyperlipidemia (HCC) 05/05/2016  . Paronychia of finger of right hand 03/09/2016  . Osteoarthritis of knee 09/08/2015  . Olecranon bursitis 09/08/2015  . Swelling of right elbow joint 08/27/2015  . Right knee pain 08/27/2015  . Numbness in feet 08/27/2015  . Anxiety 08/27/2015  . Rectal polyp       Current Outpatient Medications:  .  busPIRone (BUSPAR) 10 MG tablet, Take 1 tablet (10 mg total) by mouth 3 (three) times daily., Disp: 270 tablet, Rfl: 1 .  clopidogrel (PLAVIX) 75 MG tablet, TAKE 1 TABLET BY MOUTH EVERY DAY, Disp: 90 tablet, Rfl: 1 .  escitalopram (LEXAPRO) 20 MG tablet, Take 1 tablet (20 mg total) by mouth daily., Disp: 90 tablet, Rfl: 1 .  fluticasone (FLONASE) 50 MCG/ACT nasal spray, SPRAY 2 SPRAYS INTO EACH NOSTRIL EVERY DAY, Disp: 48 mL, Rfl: 2 .  metFORMIN (GLUCOPHAGE) 1000 MG tablet, Take one tablet twice daily, Disp: 180 tablet, Rfl: 3 .  rosuvastatin (CRESTOR) 40 MG tablet, TAKE 1 TABLET BY MOUTH EVERY DAY IN THE EVENING, Disp: 90 tablet, Rfl: 0 .  traZODone (DESYREL) 50 MG tablet, Take 0.5-1 tablets (25-50 mg total) by mouth at bedtime as needed for sleep., Disp: 90 tablet, Rfl: 1 .  azithromycin (ZITHROMAX) 250 MG tablet, Take 2 today, and 1 a day for the next 4 days (Patient not taking: Reported on 02/01/2020), Disp: 6 tablet, Rfl: 0 .  DUREZOL 0.05 % EMUL, TAKE 1 DROP(S) IN LEFT EYE EVERY 2 HOURS (Patient not taking: Reported on 11/01/2019), Disp: , Rfl: 2 .  ezetimibe (ZETIA) 10 MG tablet, Take by mouth., Disp: , Rfl:  .  glucose blood  (CONTOUR NEXT TEST) test strip, USE AS INSTRUCTED TO CHECK BLOOD GLUCOSE ONCE DAILY*VERIFY METER WITH PT, NO ANSWER 10/30*NOTCOVERED (Patient not taking: Reported on 02/01/2020), Disp: 90 each, Rfl: 2   No Known Allergies   Past Surgical History:  Procedure Laterality Date  . COLONOSCOPY N/A 10/10/2014   Procedure: COLONOSCOPY;  Surgeon: [name], MD;  Location: [facility];  Service: Gastroenterology;  Laterality: N/A;  PT WANTS EARLY  . POLYPECTOMY  10/10/2014   Procedure: POLYPECTOMY;  Surgeon: [name], MD;  Location: [facility];  Service: Gastroenterology;;     Family History  Problem Relation Age of Onset  .  COPD Mother   . Cancer Father   . Emphysema Father   . Diabetes Sister   . Heart attack Brother   . Heart disease Paternal Grandfather   . Heart attack Paternal Grandfather      Social History   Tobacco Use  . Smoking status: Current Some Day Smoker    Packs/day: 0.25    Years: 37.00    Pack years: 9.25  . Smokeless tobacco: Never Used  . Tobacco comment: 1 cigarette daily  Substance Use Topics  . Alcohol use: Yes    Alcohol/week: 4.0 standard drinks    Types: 4 Cans of beer per week    With staff assistance, above reviewed with the patient today.  ROS: As per HPI, otherwise no specific complaints on a limited and focused system review   No results found for this or any previous visit (from the past 72 hour(s)).   PHQ2/9: Depression screen [facility] 2/9 11/20/2019 11/01/2019 03/19/2019 12/28/2018 11/16/2018  Decreased Interest 0 3 0 3 3  Down, Depressed, Hopeless 0 0 0 1 0  PHQ - 2 Score 0 3 0 4 3  Altered sleeping 0 3 3 2 3   Tired, decreased energy 2 3 3 3 3   Change in appetite 0 0 0 1 1  Feeling bad or failure about yourself  0 0 0 0 0  Trouble concentrating 0 1 0 1 1  Moving slowly or fidgety/restless 0 0 0 0 0  Suicidal thoughts 0 0 0 0 -  PHQ-9 Score 2 10 6 11 11   Difficult doing work/chores Not difficult at all Somewhat difficult  Somewhat difficult Somewhat difficult Somewhat difficult  Some recent data might be hidden   PHQ-2/9 Result reviewed  Fall Risk: Fall Risk  02/01/2020 11/20/2019 11/01/2019 03/19/2019 12/28/2018  Falls in the past year? 1 1 1 1  0  Number falls in past yr: 1 1 1  0 0  Injury with Fall? 0 0 0 0 0  Risk for fall due to : - - History of fall(s) - -  Risk for fall due to: Comment - - - - -  Follow up - - Falls evaluation completed Falls evaluation completed Falls evaluation completed      Objective:   Vitals:   02/01/20 0748  BP: (!) 150/70  Pulse: 69  Temp: 98.2 F (36.8 C)  SpO2: 98%  Weight: 228 lb 1.6 oz (103.5 kg)  Height: 5\' 9"  (1.753 m)    Body mass index is 33.68 kg/m.  Physical Exam   NAD, masked,pleasant HEENT - [place]/AT, sclera anicteric,positive glasses, left eye pupil abnormal and not reactive,right eye pupil reactive,pharynx clear Neck - supple, no adenopathy, no TM,no JVD Car - RRR without m/g/r Pulm- RR and effort normal at rest, CTA without wheeze or rales Abd - soft, NT diffusely,obese,ND,  Back - no CVA tenderness Skin -no marked bruising noted, had several small abrasions that were scabbed on both distal upper extremities (he notes from his dog interactions) Ext - trace bilateralLE edema that extends into the feet bilaterally,no calf erythema or tenderness Diabetic foot exam done last visit Neuro/psychiatric - affect was not flat, appropriate with conversation Alert Grossly non-focal -adequatestrength on testing extremities, including grip strength Speech normal   Results for orders placed or performed in visit on 11/01/19  COMPLETE METABOLIC PANEL WITH GFR  Result Value Ref Range   Glucose, Bld 156 (H) 65 - 99 mg/dL   BUN 17 7 - 25 mg/dL   Creat 02/27/2019  0.70 - 1.25 mg/dL   GFR, Est Non African American 82 > OR = 60 mL/min/1.60m2   GFR, Est African American 95 > OR = 60 mL/min/1.13m2   BUN/Creatinine Ratio NOT  APPLICABLE 6 - 22 (calc)   Sodium 136 135 - 146 mmol/L   Potassium 4.9 3.5 - 5.3 mmol/L   Chloride 103 98 - 110 mmol/L   CO2 26 20 - 32 mmol/L   Calcium 9.6 8.6 - 10.3 mg/dL   Total Protein 7.6 6.1 - 8.1 g/dL   Albumin 4.2 3.6 - 5.1 g/dL   Globulin 3.4 1.9 - 3.7 g/dL (calc)   AG Ratio 1.2 1.0 - 2.5 (calc)   Total Bilirubin 0.5 0.2 - 1.2 mg/dL   Alkaline phosphatase (APISO) 72 35 - 144 U/L   AST 12 10 - 35 U/L   ALT 8 (L) 9 - 46 U/L  Lipid panel  Result Value Ref Range   Cholesterol 206 (H) <200 mg/dL   HDL 34 (L) > OR = 40 mg/dL   Triglycerides 694 (H) <150 mg/dL   LDL Cholesterol (Calc) 137 (H) mg/dL (calc)   Total CHOL/HDL Ratio 6.1 (H) <5.0 (calc)   Non-HDL Cholesterol (Calc) 172 (H) <130 mg/dL (calc)  Hemoglobin W5I  Result Value Ref Range   Hgb A1c MFr Bld 6.8 (H) <5.7 % of total Hgb   Mean Plasma Glucose 148 (calc)   eAG (mmol/L) 8.2 (calc)  Microalbumin / creatinine urine ratio  Result Value Ref Range   Creatinine, Urine 228 20 - 320 mg/dL   Microalb, Ur 627.0 mg/dL   Microalb Creat Ratio 1,059 (H) <30 mcg/mg creat  CBC with Differential/Platelet  Result Value Ref Range   WBC 8.4 3.8 - 10.8 Thousand/uL   RBC 4.24 4.20 - 5.80 Million/uL   Hemoglobin 12.7 (L) 13.2 - 17.1 g/dL   HCT 35.0 (L) 38 - 50 %   MCV 88.2 80.0 - 100.0 fL   MCH 30.0 27.0 - 33.0 pg   MCHC 34.0 32.0 - 36.0 g/dL   RDW 09.3 81.8 - 29.9 %   Platelets 276 140 - 400 Thousand/uL   MPV 10.4 7.5 - 12.5 fL   Neutro Abs 5,947 1,500 - 7,800 cells/uL   Lymphs Abs 1,621 850 - 3,900 cells/uL   Absolute Monocytes 521 200 - 950 cells/uL   Eosinophils Absolute 277 15 - 500 cells/uL   Basophils Absolute 34 0 - 200 cells/uL   Neutrophils Relative % 70.8 %   Total Lymphocyte 19.3 %   Monocytes Relative 6.2 %   Eosinophils Relative 3.3 %   Basophils Relative 0.4 %   Last labs from July reviewed A1c today- 7.6    Assessment & Plan:    1. Anxiety Still struggles some with anxiety issues, and remains on  the buspirone and Lexapro.  He does think the trazodone has been helpful at bedtime for sleep issues, and to continue He does feel the anxiety has been fairly well controlled in the recent past.  2. Current moderate episode of major depressive disorder without prior episode (HCC) His PHQ-9 today was reviewed, and has not had a significant increase in depressive symptoms, notes he gets frustrated with his overall health at times, and this contributes.  Continue the Lexapro product presently. Continue to monitor.  3. Insomnia, unspecified type As above, helped with the trazodone and to continue. Noted sometimes this can contribute to some next day fatigue, and he really has not noticed a significant difference in that when  taking either the half or the whole tablet of the trazodone.  4. Type 2 diabetes mellitus with hyperlipidemia (HCC) Taking Metformin only once daily, although thought it had been twice daily as previously recommended. His A1c was up again today. Did note his sugars being slightly less well controlled contribute to some mild fatigue at times. Recommend that he take the Metformin-1000 mg twice daily and he was understanding of that.[name] He did have microalbumin in his urine noted previously, and the ACE inhibitor was stopped by neurology due to blood pressure concerns previously, recently restarted a low-dose by vascular/cardiology, and will continue as noted will be helpful in protecting the kidneys over time. I did note that returning to lisinopril can sometimes cause a little bit of fatigue issues, and he continues to monitor his blood pressures at home as well to ensure they are staying controlled.  (And not too well controlled)  5. Class 1 obesity due to excess calories with serious comorbidity and body mass index (BMI) of 34.0 to 34.9 in adult Has had some success with weight loss in the recent past.  Encouraged that to continue. Continuing to monitor.  6. Mixed  hyperlipidemia Remains on a statin product and Zetia   7. Swelling of both lower extremities Has been a little better in the recent past noted. Recommended keeping his legs elevated frequently, especially at the end of the day to help, and he notes he has been doing so. Compression stockings can also be helpful and discussed previously.. Did note that is one entity that may help as well  8. Carotid artery stenosis, symptomatic, left Continues to follow-up with vascular/cardiology  9. Right sided weakness Has been stable, not progressed. Continue to follow with vascular/cardiology Also continue his medication regimen  10. Vision loss of left eye Continues to follow with the eye doctor, and also with vascular/cardiology  11. Tobacco dependence  strongly encouraged trying to lessen again after last visit when he noted it had increased some and he has been able to lessen use as noted above  Emphasized continuing to try to minimize tobacco use.  (With goal of complete cessation)  12.  Mild fatigue As noted above, there are several entities that could be contributing to this, and it is not marked.  He notes he has good days, and then sometimes feels a little more fatigued on other days.  His anxiety/depression in addition to the medicines used to manage could be contributing, sugars being less well controlled recently could be contributing, restarting the low-dose lisinopril could be an issue.  On his last labs checked, his counts were stable.  We will not recheck labs today, although emphasized if he is having increasing fatigue, or other symptoms of concern, importance of following up sooner than the planned follow-up.   Tentatively, schedule a follow-up again in 4 months time, follow-up sooner as needed.  Jamelle Haring, MD 02/01/20 7:53 AM

## 2020-02-01 ENCOUNTER — Other Ambulatory Visit: Payer: Self-pay

## 2020-02-01 ENCOUNTER — Encounter: Payer: Self-pay | Admitting: Internal Medicine

## 2020-02-01 ENCOUNTER — Ambulatory Visit: Payer: 59 | Admitting: Internal Medicine

## 2020-02-01 VITALS — BP 150/70 | HR 69 | Temp 98.2°F | Ht 69.0 in | Wt 228.1 lb

## 2020-02-01 DIAGNOSIS — I6522 Occlusion and stenosis of left carotid artery: Secondary | ICD-10-CM

## 2020-02-01 DIAGNOSIS — E782 Mixed hyperlipidemia: Secondary | ICD-10-CM

## 2020-02-01 DIAGNOSIS — E6609 Other obesity due to excess calories: Secondary | ICD-10-CM | POA: Diagnosis not present

## 2020-02-01 DIAGNOSIS — R69 Illness, unspecified: Secondary | ICD-10-CM | POA: Diagnosis not present

## 2020-02-01 DIAGNOSIS — F17200 Nicotine dependence, unspecified, uncomplicated: Secondary | ICD-10-CM

## 2020-02-01 DIAGNOSIS — F419 Anxiety disorder, unspecified: Secondary | ICD-10-CM | POA: Diagnosis not present

## 2020-02-01 DIAGNOSIS — R531 Weakness: Secondary | ICD-10-CM

## 2020-02-01 DIAGNOSIS — H5462 Unqualified visual loss, left eye, normal vision right eye: Secondary | ICD-10-CM

## 2020-02-01 DIAGNOSIS — E66811 Obesity, class 1: Secondary | ICD-10-CM

## 2020-02-01 DIAGNOSIS — E1169 Type 2 diabetes mellitus with other specified complication: Secondary | ICD-10-CM

## 2020-02-01 DIAGNOSIS — Z6834 Body mass index (BMI) 34.0-34.9, adult: Secondary | ICD-10-CM

## 2020-02-01 DIAGNOSIS — E785 Hyperlipidemia, unspecified: Secondary | ICD-10-CM

## 2020-02-01 DIAGNOSIS — F321 Major depressive disorder, single episode, moderate: Secondary | ICD-10-CM

## 2020-02-01 DIAGNOSIS — M7989 Other specified soft tissue disorders: Secondary | ICD-10-CM

## 2020-02-01 DIAGNOSIS — G4700 Insomnia, unspecified: Secondary | ICD-10-CM

## 2020-02-01 LAB — POCT GLYCOSYLATED HEMOGLOBIN (HGB A1C): Hemoglobin A1C: 7.6 % — AB (ref 4.0–5.6)

## 2020-02-01 MED ORDER — CLOPIDOGREL BISULFATE 75 MG PO TABS: 75.0000 mg | ORAL_TABLET | Freq: Every day | ORAL | 1 refills | Status: DC

## 2020-02-01 NOTE — Patient Instructions (Addendum)
Increase the metformin to twice a day  Continue the Plavix daily   Type 2 Diabetes Mellitus, Self Care, Adult When you have type 2 diabetes (type 2 diabetes mellitus), you must make sure your blood sugar (glucose) stays in a healthy range. You can do this with:  Nutrition.  Exercise.  Lifestyle changes.  Medicines or insulin, if needed.  Support from your doctors and others. How to stay aware of blood sugar   Check your blood sugar level every day, as often as told.  Have your A1c (hemoglobin A1c) level checked two or more times a year. Have it checked more often if your doctor tells you to. Your doctor will set personal treatment goals for you. Generally, you should have these blood sugar levels:  Before meals (preprandial): 80-130 mg/dL (4.4-7.2 mmol/L).  After meals (postprandial): below 180 mg/dL (10 mmol/L).  A1c level: less than 7%. How to manage high and low blood sugar Signs of high blood sugar High blood sugar is called hyperglycemia. Know the signs of high blood sugar. Signs may include:  Feeling: ? Thirsty. ? Hungry. ? Very tired.  Needing to pee (urinate) more than usual.  Blurry vision. Signs of low blood sugar Low blood sugar is called hypoglycemia. This is when blood sugar is at or below 70 mg/dL (3.9 mmol/L). Signs may include:  Feeling: ? Hungry. ? Worried or nervous (anxious). ? Sweaty and clammy. ? Confused. ? Dizzy. ? Sleepy. ? Sick to your stomach (nauseous).  Having: ? A fast heartbeat. ? A headache. ? A change in your vision. ? Jerky movements that you cannot control (seizure). ? Tingling or no feeling (numbness) around your mouth, lips, or tongue.  Having trouble with: ? Moving (coordination). ? Sleeping. ? Passing out (fainting). ? Getting upset easily (irritability). Treating low blood sugar To treat low blood sugar, eat or drink something sugary right away. If you can think clearly and swallow safely, follow the 15:15  rule:  Take 15 grams of a fast-acting carb (carbohydrate). Talk with your doctor about how much you should take.  Some fast-acting carbs are: ? Sugar tablets (glucose pills). Take 3-4 pills. ? 6-8 pieces of hard candy. ? 4-6 oz (120-150 mL) of fruit juice. ? 4-6 oz (120-150 mL) of regular (not diet) soda. ? 1 Tbsp (15 mL) honey or sugar.  Check your blood sugar 15 minutes after you take the carb.  If your blood sugar is still at or below 70 mg/dL (3.9 mmol/L), take 15 grams of a carb again.  If your blood sugar does not go above 70 mg/dL (3.9 mmol/L) after 3 tries, get help right away.  After your blood sugar goes back to normal, eat a meal or a snack within 1 hour. Treating very low blood sugar If your blood sugar is at or below 54 mg/dL (3 mmol/L), you have very low blood sugar (severe hypoglycemia). This is an emergency. Do not wait to see if the symptoms will go away. Get medical help right away. Call your local emergency services (911 in the [place] If you have very low blood sugar and you cannot eat or drink, you may need a glucagon shot (injection). A family member or friend should learn how to check your blood sugar and how to give you a glucagon shot. Ask your doctor if you need to have a glucagon shot kit at home. Follow these instructions at home: Medicine  Take insulin and diabetes medicines as told.  If your doctor says  you should take more or less insulin and medicines, do this exactly as told.  Do not run out of insulin or medicines. Having diabetes can raise your risk for other long-term conditions. These include heart disease and kidney disease. Your doctor may prescribe medicines to help you not have these problems. Food   Make healthy food choices. These include: ? Chicken, fish, egg whites, and beans. ? Oats, whole wheat, bulgur, brown rice, quinoa, and millet. ? Fresh fruits and vegetables. ? Low-fat dairy products. ? Nuts, avocado, olive oil, and canola  oil.  Meet with a food specialist (dietitian). He or she can help you make an eating plan that is right for you.  Follow instructions from your doctor about what you cannot eat or drink.  Drink enough fluid to keep your pee (urine) pale yellow.  Keep track of carbs that you eat. Do this by reading food labels and learning food serving sizes.  Follow your sick day plan when you cannot eat or drink normally. Make this plan with your doctor so it is ready to use. Activity  Exercise 3 or more times a week.  Do not go more than 2 days without exercising.  Talk with your doctor before you start a new exercise. Your doctor may need to tell you to change: ? How much insulin or medicines you take. ? How much food you eat. Lifestyle  Do not use any tobacco products. These include cigarettes, chewing tobacco, and e-cigarettes. If you need help quitting, ask your doctor.  Ask your doctor how much alcohol is safe for you.  Learn to deal with stress. If you need help with this, ask your doctor. Body care   Stay up to date with your shots (immunizations).  Have your eyes and feet checked by a doctor as often as told.  Check your skin and feet every day. Check for cuts, bruises, redness, blisters, or sores.  Brush your teeth and gums two times a day. Floss one or more times a day.  Go to the dentist one or more times every 6 months.  Stay at a healthy weight. General instructions  Take over-the-counter and prescription medicines only as told by your doctor.  Share your diabetes care plan with: ? Your work or school. ? People you live with.  Carry a card or wear jewelry that says you have diabetes.  Keep all follow-up visits as told by your doctor. This is important. Questions to ask your doctor  Do I need to meet with a diabetes educator?  Where can I find a support group for people with diabetes? Where to find more information To learn more about diabetes,  visit:  American Diabetes Association: www.diabetes.org  American Association of Diabetes Educators: www.diabeteseducator.org Summary  When you have type 2 diabetes, you must make sure your blood sugar (glucose) stays in a healthy range.  Check your blood sugar every day, as often as told.  Having diabetes can raise your risk for other conditions. Your doctor may prescribe medicines to help you not have these problems.  Keep all follow-up visits as told by your doctor. This is important. This information is not intended to replace advice given to you by your health care provider. Make sure you discuss any questions you have with your health care provider. Document Revised: 10/03/2017 Document Reviewed: 05/16/2015 Elsevier Patient Education  [place].   Diabetes Mellitus and Nutrition, Adult When you have diabetes (diabetes mellitus), it is very important to have  healthy eating habits because your blood sugar (glucose) levels are greatly affected by what you eat and drink. Eating healthy foods in the appropriate amounts, at about the same times every day, can help you:  Control your blood glucose.  Lower your risk of heart disease.  Improve your blood pressure.  Reach or maintain a healthy weight. Every person with diabetes is different, and each person has different needs for a meal plan. Your health care provider may recommend that you work with a diet and nutrition specialist (dietitian) to make a meal plan that is best for you. Your meal plan may vary depending on factors such as:  The calories you need.  The medicines you take.  Your weight.  Your blood glucose, blood pressure, and cholesterol levels.  Your activity level.  Other health conditions you have, such as heart or kidney disease. How do carbohydrates affect me? Carbohydrates, also called carbs, affect your blood glucose level more than any other type of food. Eating carbs naturally raises the amount  of glucose in your blood. Carb counting is a method for keeping track of how many carbs you eat. Counting carbs is important to keep your blood glucose at a healthy level, especially if you use insulin or take certain oral diabetes medicines. It is important to know how many carbs you can safely have in each meal. This is different for every person. Your dietitian can help you calculate how many carbs you should have at each meal and for each snack. Foods that contain carbs include:  Bread, cereal, rice, pasta, and crackers.  Potatoes and corn.  Peas, beans, and lentils.  Milk and yogurt.  Fruit and juice.  Desserts, such as cakes, cookies, ice cream, and candy. How does alcohol affect me? Alcohol can cause a sudden decrease in blood glucose (hypoglycemia), especially if you use insulin or take certain oral diabetes medicines. Hypoglycemia can be a life-threatening condition. Symptoms of hypoglycemia (sleepiness, dizziness, and confusion) are similar to symptoms of having too much alcohol. If your health care provider says that alcohol is safe for you, follow these guidelines:  Limit alcohol intake to no more than 1 drink per day for nonpregnant women and 2 drinks per day for men. One drink equals 12 oz of beer, 5 oz of wine, or 1 oz of hard liquor.  Do not drink on an empty stomach.  Keep yourself hydrated with water, diet soda, or unsweetened iced tea.  Keep in mind that regular soda, juice, and other mixers may contain a lot of sugar and must be counted as carbs. What are tips for following this plan?  Reading food labels  Start by checking the serving size on the "Nutrition Facts" label of packaged foods and drinks. The amount of calories, carbs, fats, and other nutrients listed on the label is based on one serving of the item. Many items contain more than one serving per package.  Check the total grams (g) of carbs in one serving. You can calculate the number of servings of  carbs in one serving by dividing the total carbs by 15. For example, if a food has 30 g of total carbs, it would be equal to 2 servings of carbs.  Check the number of grams (g) of saturated and trans fats in one serving. Choose foods that have low or no amount of these fats.  Check the number of milligrams (mg) of salt (sodium) in one serving. Most people should limit total sodium intake to  less than 2,300 mg per day.  Always check the nutrition information of foods labeled as "low-fat" or "nonfat". These foods may be higher in added sugar or refined carbs and should be avoided.  Talk to your dietitian to identify your daily goals for nutrients listed on the label. Shopping  Avoid buying canned, premade, or processed foods. These foods tend to be high in fat, sodium, and added sugar.  Shop around the outside edge of the grocery store. This includes fresh fruits and vegetables, bulk grains, fresh meats, and fresh dairy. Cooking  Use low-heat cooking methods, such as baking, instead of high-heat cooking methods like deep frying.  Cook using healthy oils, such as olive, canola, or sunflower oil.  Avoid cooking with butter, cream, or high-fat meats. Meal planning  Eat meals and snacks regularly, preferably at the same times every day. Avoid going long periods of time without eating.  Eat foods high in fiber, such as fresh fruits, vegetables, beans, and whole grains. Talk to your dietitian about how many servings of carbs you can eat at each meal.  Eat 4-6 ounces (oz) of lean protein each day, such as lean meat, chicken, fish, eggs, or tofu. One oz of lean protein is equal to: ? 1 oz of meat, chicken, or fish. ? 1 egg. ?  cup of tofu.  Eat some foods each day that contain healthy fats, such as avocado, nuts, seeds, and fish. Lifestyle  Check your blood glucose regularly.  Exercise regularly as told by your health care provider. This may include: ? 150 minutes of moderate-intensity  or vigorous-intensity exercise each week. This could be brisk walking, biking, or water aerobics. ? Stretching and doing strength exercises, such as yoga or weightlifting, at least 2 times a week.  Take medicines as told by your health care provider.  Do not use any products that contain nicotine or tobacco, such as cigarettes and e-cigarettes. If you need help quitting, ask your health care provider.  Work with a Social worker or diabetes educator to identify strategies to manage stress and any emotional and social challenges. Questions to ask a health care provider  Do I need to meet with a diabetes educator?  Do I need to meet with a dietitian?  What number can I call if I have questions?  When are the best times to check my blood glucose? Where to find more information:  American Diabetes Association: diabetes.org  Academy of Nutrition and Dietetics: www.eatright.[employer] of Diabetes and Digestive and Kidney Diseases (NIH): [employer] Summary  A healthy meal plan will help you control your blood glucose and maintain a healthy lifestyle.  Working with a diet and nutrition specialist (dietitian) can help you make a meal plan that is best for you.  Keep in mind that carbohydrates (carbs) and alcohol have immediate effects on your blood glucose levels. It is important to count carbs and to use alcohol carefully. This information is not intended to replace advice given to you by your health care provider. Make sure you discuss any questions you have with your health care provider. Document Revised: 03/25/2017 Document Reviewed: 05/17/2016 Elsevier Patient Education  2020 [employer].

## 2020-02-26 DIAGNOSIS — Z6832 Body mass index (BMI) 32.0-32.9, adult: Secondary | ICD-10-CM | POA: Diagnosis not present

## 2020-02-26 DIAGNOSIS — I6522 Occlusion and stenosis of left carotid artery: Secondary | ICD-10-CM | POA: Diagnosis not present

## 2020-05-15 DIAGNOSIS — I720 Aneurysm of carotid artery: Secondary | ICD-10-CM | POA: Diagnosis not present

## 2020-05-15 DIAGNOSIS — I6522 Occlusion and stenosis of left carotid artery: Secondary | ICD-10-CM | POA: Diagnosis not present

## 2020-05-15 DIAGNOSIS — I671 Cerebral aneurysm, nonruptured: Secondary | ICD-10-CM | POA: Diagnosis not present

## 2020-05-15 DIAGNOSIS — R9089 Other abnormal findings on diagnostic imaging of central nervous system: Secondary | ICD-10-CM | POA: Diagnosis not present

## 2020-05-23 ENCOUNTER — Ambulatory Visit: Payer: 59 | Admitting: Internal Medicine

## 2020-06-11 ENCOUNTER — Ambulatory Visit (INDEPENDENT_AMBULATORY_CARE_PROVIDER_SITE_OTHER): Payer: BC Managed Care – PPO | Admitting: Family Medicine

## 2020-06-11 ENCOUNTER — Other Ambulatory Visit: Payer: Self-pay

## 2020-06-11 ENCOUNTER — Other Ambulatory Visit: Payer: Self-pay | Admitting: Family Medicine

## 2020-06-11 ENCOUNTER — Encounter: Payer: Self-pay | Admitting: Family Medicine

## 2020-06-11 VITALS — BP 134/80 | HR 75 | Temp 98.7°F | Resp 16 | Ht 69.0 in | Wt 233.3 lb

## 2020-06-11 DIAGNOSIS — Z23 Encounter for immunization: Secondary | ICD-10-CM

## 2020-06-11 DIAGNOSIS — I10 Essential (primary) hypertension: Secondary | ICD-10-CM

## 2020-06-11 DIAGNOSIS — I6522 Occlusion and stenosis of left carotid artery: Secondary | ICD-10-CM

## 2020-06-11 DIAGNOSIS — Z1211 Encounter for screening for malignant neoplasm of colon: Secondary | ICD-10-CM

## 2020-06-11 DIAGNOSIS — E1169 Type 2 diabetes mellitus with other specified complication: Secondary | ICD-10-CM

## 2020-06-11 DIAGNOSIS — E785 Hyperlipidemia, unspecified: Secondary | ICD-10-CM | POA: Diagnosis not present

## 2020-06-11 DIAGNOSIS — F321 Major depressive disorder, single episode, moderate: Secondary | ICD-10-CM

## 2020-06-11 DIAGNOSIS — E6609 Other obesity due to excess calories: Secondary | ICD-10-CM

## 2020-06-11 DIAGNOSIS — E782 Mixed hyperlipidemia: Secondary | ICD-10-CM

## 2020-06-11 DIAGNOSIS — Z0000 Encounter for general adult medical examination without abnormal findings: Secondary | ICD-10-CM | POA: Diagnosis not present

## 2020-06-11 DIAGNOSIS — Z6834 Body mass index (BMI) 34.0-34.9, adult: Secondary | ICD-10-CM

## 2020-06-11 LAB — POCT GLYCOSYLATED HEMOGLOBIN (HGB A1C): Hemoglobin A1C: 6.8 % — AB (ref 4.0–5.6)

## 2020-06-11 NOTE — Patient Instructions (Signed)
It was great to see you!  Our plans for today:  - Don't take your lisinopril for the next month. Keep a log of your blood pressures as well as when you have dizzy spells. Bring this with you when you come for follow up in one month. - Cut your venlafaxine pill in half and take this for the next two weeks. After two weeks, stop taking venlafaxine all together.  - No changes to your other medications.   We are checking some labs today, we will release these results to your MyChart.  Take care and seek immediate care sooner if you develop any concerns.   Dr. Linwood Dibbles

## 2020-06-11 NOTE — Progress Notes (Signed)
SUBJECTIVE:   CHIEF COMPLAINT / HPI:   Patient Active Problem List   Diagnosis Date Noted  . Hypertension 06/12/2020  . Tobacco dependence 11/01/2019  . Swelling of both lower extremities 11/01/2019  . Vision loss of left eye 11/01/2019  . Right sided weakness 03/19/2019  . Insomnia 03/19/2019  . Current moderate episode of major depressive disorder without prior episode (HCC) 03/19/2019  . Carotid artery stenosis, symptomatic, left 01/23/2018  . Class 1 obesity due to excess calories with serious comorbidity and body mass index (BMI) of 34.0 to 34.9 in adult 12/16/2017  . Mixed hyperlipidemia 08/11/2016  . Vitamin D deficiency 08/11/2016  . Type 2 diabetes mellitus with hyperlipidemia (HCC) 05/05/2016  . Paronychia of finger of right hand 03/09/2016  . Osteoarthritis of knee 09/08/2015  . Olecranon bursitis 09/08/2015  . Swelling of right elbow joint 08/27/2015  . Right knee pain 08/27/2015  . Numbness in feet 08/27/2015  . Anxiety 08/27/2015  . Rectal polyp    Diabetes, Type 2 - Last A1c 7.6 01/2020 - Medications: metformin 1000mg  BID - Compliance: good - Checking BG at home: yes, 140s-180s. Once had 200s a month ago. - Eye exam: UTD - Foot exam: due - Statin: yes - Denies symptoms of hypoglycemia, polyuria, polydipsia, foot ulcers/trauma - has chronic neuropathy, numbness of feet  HLD - medications: rosuvastatin 40mg , zetia - compliance: misses about twice weekly - medication SEs: none  Hypertension: - Medications: lisinopril 5mg  daily - Compliance: good - Checking BP at home: yes, 140s SBP - Denies any SOB, CP, vision changes, LE edema, medication SEs - did not take med today.  Carotid Artery Stenosis, Dysautonomia - with orthostatic hypotension. Likely 2/2 prior poorly controlled and undiagnosed DM2. Previously evaluated by Neuro and Neurosurg. Still having dizzy spells with positional changes.   Depression/Anxiety - Medications: buspar 10mg  TID, lexapro  20mg  daily, venlafaxine 75mg , trazodone 50mg  qhs - previously taken xanax, atarax, buspar, lexapro, effexor, wellbutrin - Taking: sometimes 3-4 days per week for trazodone - Counseling: no - Symptoms: sweating - Current stressors: not working   OBJECTIVE:   BP 134/80   Pulse 75   Temp 98.7 F (37.1 C) (Oral)   Resp 16   Ht 5\' 9"  (1.753 m)   Wt 233 lb 4.8 oz (105.8 kg)   SpO2 96%   BMI 34.45 kg/m   Gen: well appearing, in NAD Card: RRR Lungs: CTAB Ext: WWP, no edema. Foot exam with bilateral numbness to monofilament exam, gross sensation intact, no wounds, intact DP pulses.  ASSESSMENT/PLAN:   Type 2 diabetes mellitus with hyperlipidemia (HCC) A1c improved, 6.8 today. No changes made today. Referral placed for eye exam. Obtaining lipid panel. Foot exam without wounds but sensation not intact to monofilament testing bilaterally. Encouraged proper foot wear.  Class 1 obesity due to excess calories with serious comorbidity and body mass index (BMI) of 34.0 to 34.9 in adult Recommend weight loss through diet and exercise.  Mixed hyperlipidemia Rechecking lipid panel today.  Carotid artery stenosis, symptomatic, left With orthostatic hypotension, will work to maintain adequate BP for perfusion and control blood sugars to prevent further dysautonomia. Will discontinue lisinopril for now given only mild elevation in the setting of missing dose today. Pt will keep record of Bps as well as dizzy spells and f/u in one month.  Hypertension Only mildly elevated today in the setting of missed dose. Given dysautonomia and orthostatic symptoms, will discontinue lisinopril for now and have pt keep record of BP  at home as well as dizzy spells and bring log to f/u in one month. Obtaining BMP today.  Current moderate episode of major depressive disorder without prior episode (HCC) Overall doing well. Unclear why on both SSRI and SNRI, will aim to d/c SNRI given prior success with lexapro and  risk of serotonin syndrome. Patient to taper to 1/2 tab of effexor for two weeks prior to discontinuation. Advised depressive symptoms may worsen and potential need to titrate lexapro. F/u in 4 weeks.      Caro Laroche, DO

## 2020-06-12 ENCOUNTER — Other Ambulatory Visit: Payer: Self-pay | Admitting: Family Medicine

## 2020-06-12 ENCOUNTER — Encounter: Payer: Self-pay | Admitting: Family Medicine

## 2020-06-12 DIAGNOSIS — E1169 Type 2 diabetes mellitus with other specified complication: Secondary | ICD-10-CM

## 2020-06-12 DIAGNOSIS — I10 Essential (primary) hypertension: Secondary | ICD-10-CM | POA: Insufficient documentation

## 2020-06-12 DIAGNOSIS — E785 Hyperlipidemia, unspecified: Secondary | ICD-10-CM

## 2020-06-12 LAB — LIPID PANEL
Cholesterol: 233 mg/dL — ABNORMAL HIGH (ref <200)
HDL: 38 mg/dL — ABNORMAL LOW (ref >=40)
LDL Cholesterol (Calc): 149 mg/dL (calc) — ABNORMAL HIGH
Non-HDL Cholesterol (Calc): 195 mg/dL (calc) — ABNORMAL HIGH (ref <130)
Total CHOL/HDL Ratio: 6.1 (calc) — ABNORMAL HIGH (ref <5.0)
Triglycerides: 280 mg/dL — ABNORMAL HIGH (ref <150)

## 2020-06-12 LAB — BASIC METABOLIC PANEL
BUN: 18 mg/dL (ref 7–25)
CO2: 26 mmol/L (ref 20–32)
Calcium: 9.6 mg/dL (ref 8.6–10.3)
Chloride: 103 mmol/L (ref 98–110)
Creat: 1.01 mg/dL (ref 0.70–1.25)
Glucose, Bld: 163 mg/dL — ABNORMAL HIGH (ref 65–99)
Potassium: 4.5 mmol/L (ref 3.5–5.3)
Sodium: 138 mmol/L (ref 135–146)

## 2020-06-12 LAB — CBC
HCT: 38.4 % — ABNORMAL LOW (ref 38.5–50.0)
Hemoglobin: 13.6 g/dL (ref 13.2–17.1)
MCH: 31.9 pg (ref 27.0–33.0)
MCHC: 35.4 g/dL (ref 32.0–36.0)
MCV: 89.9 fL (ref 80.0–100.0)
MPV: 10.7 fL (ref 7.5–12.5)
Platelets: 295 10*3/uL (ref 140–400)
RBC: 4.27 10*6/uL (ref 4.20–5.80)
RDW: 12.3 % (ref 11.0–15.0)
WBC: 8.4 10*3/uL (ref 3.8–10.8)

## 2020-06-12 NOTE — Assessment & Plan Note (Addendum)
Overall doing well. Unclear why on both SSRI and SNRI, will aim to d/c SNRI given prior success with lexapro and risk of serotonin syndrome. Patient to taper to 1/2 tab of effexor for two weeks prior to discontinuation. Advised depressive symptoms may worsen and potential need to titrate lexapro. F/u in 4 weeks.

## 2020-06-12 NOTE — Assessment & Plan Note (Addendum)
A1c improved, 6.8 today. No changes made today. Referral placed for eye exam. Obtaining lipid panel. Foot exam without wounds but sensation not intact to monofilament testing bilaterally. Encouraged proper foot wear.

## 2020-06-12 NOTE — Assessment & Plan Note (Signed)
Only mildly elevated today in the setting of missed dose. Given dysautonomia and orthostatic symptoms, will discontinue lisinopril for now and have pt keep record of BP at home as well as dizzy spells and bring log to f/u in one month. Obtaining BMP today.

## 2020-06-12 NOTE — Assessment & Plan Note (Signed)
With orthostatic hypotension, will work to maintain adequate BP for perfusion and control blood sugars to prevent further dysautonomia. Will discontinue lisinopril for now given only mild elevation in the setting of missing dose today. Pt will keep record of Bps as well as dizzy spells and f/u in one month.

## 2020-06-12 NOTE — Assessment & Plan Note (Signed)
Recommend weight loss through diet and exercise. 

## 2020-06-12 NOTE — Assessment & Plan Note (Signed)
Rechecking lipid panel today

## 2020-06-20 ENCOUNTER — Encounter: Payer: Self-pay | Admitting: *Deleted

## 2020-07-08 ENCOUNTER — Ambulatory Visit (INDEPENDENT_AMBULATORY_CARE_PROVIDER_SITE_OTHER): Payer: BC Managed Care – PPO | Admitting: Family Medicine

## 2020-07-08 ENCOUNTER — Encounter: Payer: Self-pay | Admitting: Family Medicine

## 2020-07-08 ENCOUNTER — Other Ambulatory Visit: Payer: Self-pay

## 2020-07-08 VITALS — BP 143/70 | HR 84 | Temp 97.6°F | Resp 16 | Ht 69.0 in | Wt 236.4 lb

## 2020-07-08 DIAGNOSIS — I6522 Occlusion and stenosis of left carotid artery: Secondary | ICD-10-CM

## 2020-07-08 DIAGNOSIS — I10 Essential (primary) hypertension: Secondary | ICD-10-CM | POA: Diagnosis not present

## 2020-07-08 DIAGNOSIS — J310 Chronic rhinitis: Secondary | ICD-10-CM | POA: Diagnosis not present

## 2020-07-08 DIAGNOSIS — F419 Anxiety disorder, unspecified: Secondary | ICD-10-CM | POA: Diagnosis not present

## 2020-07-08 DIAGNOSIS — G4700 Insomnia, unspecified: Secondary | ICD-10-CM

## 2020-07-08 DIAGNOSIS — E7800 Pure hypercholesterolemia, unspecified: Secondary | ICD-10-CM | POA: Diagnosis not present

## 2020-07-08 DIAGNOSIS — G901 Familial dysautonomia [Riley-Day]: Secondary | ICD-10-CM | POA: Insufficient documentation

## 2020-07-08 DIAGNOSIS — H5462 Unqualified visual loss, left eye, normal vision right eye: Secondary | ICD-10-CM

## 2020-07-08 DIAGNOSIS — R531 Weakness: Secondary | ICD-10-CM

## 2020-07-08 MED ORDER — EZETIMIBE 10 MG PO TABS: 10.0000 mg | ORAL_TABLET | Freq: Every day | ORAL | 3 refills | Status: DC

## 2020-07-08 MED ORDER — ESCITALOPRAM OXALATE 10 MG PO TABS: 10.0000 mg | ORAL_TABLET | Freq: Every day | ORAL | 1 refills | Status: DC

## 2020-07-08 MED ORDER — CLOPIDOGREL BISULFATE 75 MG PO TABS: 75.0000 mg | ORAL_TABLET | Freq: Every day | ORAL | 3 refills | Status: DC

## 2020-07-08 MED ORDER — FLUTICASONE PROPIONATE 50 MCG/ACT NA SUSP: NASAL | 2 refills | Status: AC

## 2020-07-08 MED ORDER — ESCITALOPRAM OXALATE 20 MG PO TABS
20.0000 mg | ORAL_TABLET | Freq: Every day | ORAL | 1 refills | Status: DC
Start: 2020-07-08 — End: 2022-12-24

## 2020-07-08 MED ORDER — ROSUVASTATIN CALCIUM 40 MG PO TABS
40.0000 mg | ORAL_TABLET | Freq: Every evening | ORAL | 3 refills | Status: DC
Start: 2020-07-08 — End: 2022-12-24

## 2020-07-08 NOTE — Assessment & Plan Note (Signed)
Only slightly elevated with less instances of hypotension since discontinuation of lisinopril. Will continue to withhold. F/u in 3 months.

## 2020-07-08 NOTE — Assessment & Plan Note (Signed)
Symptoms not well controlled, will increase lexapro to max dose given prior good effect. Discussed coping mechanisms and sleep hygiene today. F/u in 3 months.

## 2020-07-08 NOTE — Assessment & Plan Note (Signed)
Orthostatic symptoms improved since discontinuation of lisinopril, continue to withhold. Continue compression stockings. F/u in 3 months.

## 2020-07-08 NOTE — Assessment & Plan Note (Signed)
Anxiety/depression contributing, increase lexapro. Discussed sleep hygiene today. F/u 3 months.

## 2020-07-08 NOTE — Progress Notes (Signed)
    SUBJECTIVE:   CHIEF COMPLAINT / HPI:   Hypertension Carotid artery stenosis Orthostatic Hypotension 2/2 Dysautonomia - Medications: none - Compliance: n/a - Checking BP at home: yes, 140s. Lowest 120s a few times.  - Denies any SOB, CP, vision changes, LE edema, medication SEs - less instances of hypotension/dizziness  Anxiety/Depression: - Medications: buspar 10mg  TID, lexapro 20mg  daily, trazodone 50mg  qhs - previously taken xanax, atarax, buspar, lexapro, effexor, wellbutrin - Taking: not taking buspar, didn't feel it helped. - Counseling: no - Symptoms: fatigue, hard to fall asleep - Current stressors: not being able to work, eyesight.  - Coping Mechanisms: fixing cars.  Depression screen [facility] 2/9 07/08/2020 02/01/2020 02/01/2020 11/20/2019 11/01/2019  Decreased Interest 2 2 2  0 3  Down, Depressed, Hopeless 2 1 0 0 0  PHQ - 2 Score 4 3 2  0 3  Altered sleeping 2 1 - 0 3  Tired, decreased energy 3 2 - 2 3  Change in appetite 0 2 - 0 0  Feeling bad or failure about yourself  0 1 - 0 0  Trouble concentrating 2 2 - 0 1  Moving slowly or fidgety/restless 2 1 - 0 0  Suicidal thoughts 0 0 - 0 0  PHQ-9 Score 13 12 - 2 10  Difficult doing work/chores Somewhat difficult Somewhat difficult - Not difficult at all Somewhat difficult  Some recent data might be hidden     OBJECTIVE:   BP (!) 143/70   Pulse 84   Temp 97.6 F (36.4 C) (Oral)   Resp 16   Ht 5\' 9"  (1.753 m)   Wt 236 lb 6.4 oz (107.2 kg)   SpO2 96%   BMI 34.91 kg/m   Gen: well appearing, in NAD Card: Reg rate Lungs: Comfortable WOB on RA Ext: WWP Psych: mood and affect appropriate   ASSESSMENT/PLAN:   Hypertension Only slightly elevated with less instances of hypotension since discontinuation of lisinopril. Will continue to withhold. F/u in 3 months.  Carotid artery stenosis, symptomatic, left Orthostatic symptoms improved since discontinuation of lisinopril, continue to withhold. Continue compression  stockings. F/u in 3 months.  Anxiety Symptoms not well controlled, will increase lexapro to max dose given prior good effect. Discussed coping mechanisms and sleep hygiene today. F/u in 3 months.  Insomnia Anxiety/depression contributing, increase lexapro. Discussed sleep hygiene today. F/u 3 months.    04/02/2020, DO

## 2020-07-08 NOTE — Patient Instructions (Signed)
It was great to see you!  Our plans for today:  - We increased your lexapro today. Let us know if you don't tolerate this. - Continue to not take the lisinopril.   - Try the following to help you sleep better:  - limit naps during the day  - no screens (TV, phone, tablet, computer) at least 1-2 hours before bedtime.  - have a quiet and dark sleeping environment.  - no large meals or drinks about 1 hour before bed.  - Avoid caffeine after 3pm.  - Exercise or move your body regularly every day.   - If you find your mind is racing at night, keep a notepad by your bed and write down your thoughts.  - If you are lying in bed for 30 mins-1 hour and aren't falling asleep, get out of bed and do something relaxing like reading (NO TV!) until you are tired.   Come back in 3 months, we will recheck your cholesterol at this visit.   Take care and seek immediate care sooner if you develop any concerns.   Dr. Linwood Dibbles

## 2022-12-21 ENCOUNTER — Telehealth: Payer: Self-pay | Admitting: Internal Medicine

## 2022-12-21 NOTE — Telephone Encounter (Signed)
Copied from CRM [id number]. Topic: General - Other >> Dec 21, 2022  9:38 AM Epimenio Foot F wrote: Reason for CRM: Pt is calling in returning a call from [place]. Please follow up with pt

## 2022-12-24 ENCOUNTER — Encounter: Payer: Self-pay | Admitting: Internal Medicine

## 2022-12-24 ENCOUNTER — Ambulatory Visit: Payer: BC Managed Care – PPO | Admitting: Internal Medicine

## 2022-12-24 VITALS — BP 142/86 | HR 107 | Temp 97.6°F | Ht 69.0 in | Wt 214.8 lb

## 2022-12-24 DIAGNOSIS — E7800 Pure hypercholesterolemia, unspecified: Secondary | ICD-10-CM | POA: Diagnosis not present

## 2022-12-24 DIAGNOSIS — I739 Peripheral vascular disease, unspecified: Secondary | ICD-10-CM | POA: Diagnosis not present

## 2022-12-24 DIAGNOSIS — H5462 Unqualified visual loss, left eye, normal vision right eye: Secondary | ICD-10-CM | POA: Diagnosis not present

## 2022-12-24 DIAGNOSIS — Z8673 Personal history of transient ischemic attack (TIA), and cerebral infarction without residual deficits: Secondary | ICD-10-CM

## 2022-12-24 DIAGNOSIS — E785 Hyperlipidemia, unspecified: Secondary | ICD-10-CM | POA: Diagnosis not present

## 2022-12-24 DIAGNOSIS — I6522 Occlusion and stenosis of left carotid artery: Secondary | ICD-10-CM | POA: Diagnosis not present

## 2022-12-24 DIAGNOSIS — Z125 Encounter for screening for malignant neoplasm of prostate: Secondary | ICD-10-CM | POA: Diagnosis not present

## 2022-12-24 DIAGNOSIS — E1169 Type 2 diabetes mellitus with other specified complication: Secondary | ICD-10-CM

## 2022-12-24 DIAGNOSIS — Z23 Encounter for immunization: Secondary | ICD-10-CM | POA: Diagnosis not present

## 2022-12-24 DIAGNOSIS — F419 Anxiety disorder, unspecified: Secondary | ICD-10-CM

## 2022-12-24 MED ORDER — ESCITALOPRAM OXALATE 5 MG PO TABS
5.0000 mg | ORAL_TABLET | Freq: Every day | ORAL | 1 refills | Status: DC
Start: 2022-12-24 — End: 2023-01-21

## 2022-12-24 MED ORDER — ROSUVASTATIN CALCIUM 40 MG PO TABS
40.0000 mg | ORAL_TABLET | Freq: Every evening | ORAL | 3 refills | Status: AC
Start: 2022-12-24 — End: ?

## 2022-12-24 MED ORDER — CLOPIDOGREL BISULFATE 75 MG PO TABS: 75.0000 mg | ORAL_TABLET | Freq: Every day | ORAL | 3 refills | Status: AC

## 2022-12-24 MED ORDER — EZETIMIBE 10 MG PO TABS: 10.0000 mg | ORAL_TABLET | Freq: Every day | ORAL | 3 refills | Status: AC

## 2022-12-24 NOTE — Progress Notes (Signed)
Established Patient Office Visit  Subjective   Patient ID: Daniel Snyder, male    DOB: November 12, 1955  Age: 67 y.o. MRN: [id number]  Chief Complaint  Patient presents with   Follow-up    HPI  Patient here for follow up on chronic medical conditions. He is new to me and has not been seen here in 2 years. He is here with his daughter. Wanting to discuss potentially getting disability. Also needs letter for jury duty and handicap placard.   Hypertension/Dysautonomia: -Medications: None -Patient is compliant with above medications and reports no side effects. -Checking BP at home (average):  -Denies any SOB, CP, vision changes, LE edema or symptoms of hypotension  HLD/Carotid Artery Stenosis/Hx of CVA: -Medications: Not on anything currently, had been on Plavix, Crestor and Zetia  -Patient is compliant with above medications and reports no side effects.  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, history of multiple TIA's  -CTA head and neck in 2019 with severe stenosis in the left and right ICA -Had been following with vascular, hasn't been seen in at least 2 years, now complaining of cold extremities, current smoker  Diabetes, Type 2: -Last A1c 6.8% 2/22 -Medications: Nothing currently, had been on Metformin  -Checking BG at home: No -Eye exam: Due discuss at follow up -Foot exam: Due discuss at follow up -Microalbumin: Due discuss at follow up -Statin: No -PNA vaccine: Discuss at follow up -Denies symptoms of hypoglycemia, polyuria, polydipsia, numbness extremities, foot ulcers/trauma.    Patient Active Problem List   Diagnosis Date Noted   Dysautonomia (HCC) 07/08/2020   Hypertension 06/12/2020   Tobacco dependence  11/01/2019   Swelling of both lower extremities 11/01/2019   Vision loss of left eye 11/01/2019   Right sided weakness 03/19/2019   Insomnia 03/19/2019   Current moderate episode of major depressive disorder without prior episode (HCC) 03/19/2019   Carotid artery stenosis, symptomatic, left 01/23/2018   Class 1 obesity due to excess calories with serious comorbidity and body mass index (BMI) of 34.0 to 34.9 in adult 12/16/2017   Mixed hyperlipidemia 08/11/2016   Vitamin D deficiency 08/11/2016   Type 2 diabetes mellitus with hyperlipidemia (HCC) 05/05/2016   Paronychia of finger of right hand 03/09/2016   Osteoarthritis of knee 09/08/2015   Olecranon bursitis 09/08/2015   Swelling of right elbow joint 08/27/2015   Right knee pain 08/27/2015   Numbness in feet 08/27/2015   Anxiety 08/27/2015   Rectal polyp    Past Medical History:  Diagnosis Date   Anxiety    Arthritis    fingers   Diabetes mellitus without complication (HCC)    Hx of colonic polyps    Past Surgical History:  Procedure Laterality Date   COLONOSCOPY N/A 10/10/2014   Procedure: COLONOSCOPY;  Surgeon: [name], MD;  Location: [facility];  Service: Gastroenterology;  Laterality: N/A;  PT WANTS EARLY   POLYPECTOMY  10/10/2014   Procedure: POLYPECTOMY;  Surgeon: [name], MD;  Location: [facility];  Service: Gastroenterology;;   Social History   Tobacco Use   Smoking status: Some Days    Current packs/day: 0.25  Average packs/day: 0.3 packs/day for 37.0 years (9.3 ttl pk-yrs)    Types: Cigarettes   Smokeless tobacco: Never   Tobacco comments:    1 cigarette daily  Vaping Use   Vaping status: Never Used  Substance Use Topics   Alcohol use: Yes    Alcohol/week: 4.0 standard drinks of alcohol    Types: 4 Cans of beer per week   Drug use: No   Social History   Socioeconomic History   Marital status: Divorced    Spouse name: Not on file   Number of children: 1   Years of  education: Not on file   Highest education level: Not on file  Occupational History   Not on file  Tobacco Use   Smoking status: Some Days    Current packs/day: 0.25    Average packs/day: 0.3 packs/day for 37.0 years (9.3 ttl pk-yrs)    Types: Cigarettes   Smokeless tobacco: Never   Tobacco comments:    1 cigarette daily  Vaping Use   Vaping status: Never Used  Substance and Sexual Activity   Alcohol use: Yes    Alcohol/week: 4.0 standard drinks of alcohol    Types: 4 Cans of beer per week   Drug use: No   Sexual activity: Not Currently    Partners: Female  Other Topics Concern   Not on file  Social History Narrative   Not on file   Social Determinants of Health   Financial Resource Strain: Low Risk  (05/18/2018)   Overall Financial Resource Strain (CARDIA)    Difficulty of Paying Living Expenses: Not hard at all  Food Insecurity: No Food Insecurity (05/18/2018)   Hunger Vital Sign    Worried About Running Out of Food in the Last Year: Never true    Ran Out of Food in the Last Year: Never true  Transportation Needs: No Transportation Needs (05/18/2018)   PRAPARE - Administrator, Civil Service (Medical): No    Lack of Transportation (Non-Medical): No  Physical Activity: Inactive (05/18/2018)   Exercise Vital Sign    Days of Exercise per Week: 0 days    Minutes of Exercise per Session: 0 min  Stress: No Stress Concern Present (05/18/2018)   [employer] of Occupational Health - Occupational Stress Questionnaire    Feeling of Stress : Not at all  Social Connections: Moderately Isolated (05/18/2018)   Social Connection and Isolation Panel [NHANES]    Frequency of Communication with Friends and Family: Twice a week    Frequency of Social Gatherings with Friends and Family: Twice a week    Attends Religious Services: Never    Database administrator or Organizations: No    Attends Banker Meetings: Never    Marital Status: Divorced  Careers information officer Violence: Not At Risk (05/18/2018)   Humiliation, Afraid, Rape, and Kick questionnaire    Fear of Current or Ex-Partner: No    Emotionally Abused: No    Physically Abused: No    Sexually Abused: No   Family Status  Relation Name Status   Mother  Deceased   Father  Deceased   Sister  Alive   Brother  Alive   Son  Alive   MGM never met Deceased   MGF never met Deceased   PGM  Deceased   PGF  Deceased at age 30       heart attack  No partnership data on file   Family History  Problem Relation  Age of Onset   COPD Mother    Cancer Father    Emphysema Father    Diabetes Sister    Heart attack Brother    Heart disease Paternal Grandfather    Heart attack Paternal Grandfather    No Known Allergies    Review of Systems  Constitutional:  Negative for chills and fever.  Respiratory:  Negative for shortness of breath.   Cardiovascular:  Negative for chest pain.      Objective:     BP (!) 142/86   Pulse (!) 107   Temp 97.6 F (36.4 C)   Ht 5\' 9"  (1.753 m)   Wt 214 lb 12.8 oz (97.4 kg)   SpO2 98%   BMI 31.72 kg/m  BP Readings from Last 3 Encounters:  12/24/22 (!) 142/86  07/08/20 (!) 143/70  06/11/20 134/80   Wt Readings from Last 3 Encounters:  12/24/22 214 lb 12.8 oz (97.4 kg)  07/08/20 236 lb 6.4 oz (107.2 kg)  06/11/20 233 lb 4.8 oz (105.8 kg)      Physical Exam Constitutional:      Appearance: Normal appearance.  HENT:     Head: Normocephalic and atraumatic.     Mouth/Throat:     Mouth: Mucous membranes are moist.     Pharynx: Oropharynx is clear.  Neck:     Vascular: Carotid bruit present.  Cardiovascular:     Rate and Rhythm: Normal rate and regular rhythm.  Pulmonary:     Effort: Pulmonary effort is normal.     Breath sounds: Normal breath sounds.  Musculoskeletal:     Right lower leg: No edema.     Left lower leg: No edema.  Skin:    General: Skin is warm and dry.  Neurological:     General: No focal deficit present.     Mental  Status: He is alert. Mental status is at baseline.  Psychiatric:        Mood and Affect: Mood normal.        Behavior: Behavior normal.      No results found for any visits on 12/24/22.  Last CBC Lab Results  Component Value Date   WBC 8.4 06/11/2020   HGB 13.6 06/11/2020   HCT 38.4 (L) 06/11/2020   MCV 89.9 06/11/2020   MCH 31.9 06/11/2020   RDW 12.3 06/11/2020   PLT 295 06/11/2020   Last metabolic panel Lab Results  Component Value Date   GLUCOSE 163 (H) 06/11/2020   NA 138 06/11/2020   K 4.5 06/11/2020   CL 103 06/11/2020   CO2 26 06/11/2020   BUN 18 06/11/2020   CREATININE 1.01 06/11/2020   GFRNONAA 82 11/01/2019   CALCIUM 9.6 06/11/2020   PROT 7.6 11/01/2019   ALBUMIN 4.1 05/05/2016   BILITOT 0.5 11/01/2019   ALKPHOS 69 05/05/2016   AST 12 11/01/2019   ALT 8 (L) 11/01/2019   Last lipids Lab Results  Component Value Date   CHOL 233 (H) 06/11/2020   HDL 38 (L) 06/11/2020   LDLCALC 149 (H) 06/11/2020   TRIG 280 (H) 06/11/2020   CHOLHDL 6.1 (H) 06/11/2020   Last hemoglobin A1c Lab Results  Component Value Date   HGBA1C 6.8 (A) 06/11/2020   Last thyroid functions Lab Results  Component Value Date   TSH 1.37 05/05/2016   Last vitamin D Lab Results  Component Value Date   VD25OH 28 (L) 11/15/2017   Last vitamin B12 and Folate No results found for: "VITAMINB12", "FOLATE"  The ASCVD Risk score (Arnett DK, et al., 2019) failed to calculate for the following reasons:   The patient has a prior MI or stroke diagnosis    Assessment & Plan:   1. Pure hypercholesterolemia/Carotid artery stenosis, symptomatic, left/Vision loss of left eye/PAD (peripheral artery disease) (HCC)/History of CVA (cerebrovascular accident): Recheck lipids, restart Crestor 40 mg, Plavix and Zetia. Referral back to vascular placed. Jury duty letter given, handicap placard form filled out. Blood pressure borderline, will recheck at follow up.  - ezetimibe (ZETIA) 10 MG tablet;  Take 1 tablet (10 mg total) by mouth daily.  Dispense: 90 tablet; Refill: 3 - rosuvastatin (CRESTOR) 40 MG tablet; Take 1 tablet (40 mg total) by mouth every evening.  Dispense: 90 tablet; Refill: 3 - Lipid Profile - ezetimibe (ZETIA) 10 MG tablet; Take 1 tablet (10 mg total) by mouth daily.  Dispense: 90 tablet; Refill: 3 - Ambulatory referral to Vascular Surgery  2. Type 2 diabetes mellitus with hyperlipidemia (HCC): Labs due, recheck A1c and urine.   - CBC w/Diff/Platelet - COMPLETE METABOLIC PANEL WITH GFR - Lipid Profile - HgB A1c - Urine Microalbumin w/creat. ratio  3. Anxiety: Restart Lexapro 5 mg.  - escitalopram (LEXAPRO) 5 MG tablet; Take 1 tablet (5 mg total) by mouth daily.  Dispense: 30 tablet; Refill: 1  4. Prostate cancer screening: Screening labs added on.  - PSA  5. Need for influenza vaccination: Flu vaccine administered today.   - Flu vaccine trivalent PF, 6mos and older(Flulaval,Afluria,Fluarix,Fluzone)   Return in about 3 months (around 03/26/2023).    Margarita Mail, DO

## 2022-12-25 LAB — LIPID PANEL
Cholesterol: 239 mg/dL — ABNORMAL HIGH (ref <200)
HDL: 36 mg/dL — ABNORMAL LOW (ref >=40)
LDL Cholesterol (Calc): 151 mg/dL — ABNORMAL HIGH
Non-HDL Cholesterol (Calc): 203 mg/dL — ABNORMAL HIGH (ref <130)
Total CHOL/HDL Ratio: 6.6 (calc) — ABNORMAL HIGH (ref <5.0)
Triglycerides: 337 mg/dL — ABNORMAL HIGH (ref <150)

## 2022-12-25 LAB — HEMOGLOBIN A1C
Hgb A1c MFr Bld: 11.2 %{Hb} — ABNORMAL HIGH (ref <5.7)
Mean Plasma Glucose: 275 mg/dL
eAG (mmol/L): 15.2 mmol/L

## 2022-12-25 LAB — COMPLETE METABOLIC PANEL WITH GFR
AG Ratio: 1.3 (calc) (ref 1.0–2.5)
ALT: 7 U/L — ABNORMAL LOW (ref 9–46)
AST: 8 U/L — ABNORMAL LOW (ref 10–35)
Albumin: 4.2 g/dL (ref 3.6–5.1)
Alkaline phosphatase (APISO): 97 U/L (ref 35–144)
BUN: 18 mg/dL (ref 7–25)
CO2: 27 mmol/L (ref 20–32)
Calcium: 9.9 mg/dL (ref 8.6–10.3)
Chloride: 94 mmol/L — ABNORMAL LOW (ref 98–110)
Creat: 1.15 mg/dL (ref 0.70–1.35)
Globulin: 3.2 g/dL (ref 1.9–3.7)
Glucose, Bld: 462 mg/dL — ABNORMAL HIGH (ref 65–99)
Potassium: 4.9 mmol/L (ref 3.5–5.3)
Sodium: 132 mmol/L — ABNORMAL LOW (ref 135–146)
Total Bilirubin: 0.5 mg/dL (ref 0.2–1.2)
Total Protein: 7.4 g/dL (ref 6.1–8.1)
eGFR: 71 mL/min/{1.73_m2} (ref >=60)

## 2022-12-25 LAB — CBC WITH DIFFERENTIAL/PLATELET
Absolute Monocytes: 773 {cells}/uL (ref 200–950)
Basophils Absolute: 46 cells/uL (ref 0–200)
Basophils Relative: 0.5 %
Eosinophils Absolute: 239 {cells}/uL (ref 15–500)
Eosinophils Relative: 2.6 %
HCT: 42.5 % (ref 38.5–50.0)
Hemoglobin: 14.5 g/dL (ref 13.2–17.1)
Lymphs Abs: 1886 {cells}/uL (ref 850–3900)
MCH: 30.4 pg (ref 27.0–33.0)
MCHC: 34.1 g/dL (ref 32.0–36.0)
MCV: 89.1 fL (ref 80.0–100.0)
MPV: 11 fL (ref 7.5–12.5)
Monocytes Relative: 8.4 %
Neutro Abs: 6256 {cells}/uL (ref 1500–7800)
Neutrophils Relative %: 68 %
Platelets: 328 10*3/uL (ref 140–400)
RBC: 4.77 10*6/uL (ref 4.20–5.80)
RDW: 12.5 % (ref 11.0–15.0)
Total Lymphocyte: 20.5 %
WBC: 9.2 10*3/uL (ref 3.8–10.8)

## 2022-12-25 LAB — MICROALBUMIN / CREATININE URINE RATIO
Creatinine, Urine: 44 mg/dL (ref 20–320)
Microalb Creat Ratio: 1082 mg/g{creat} — ABNORMAL HIGH (ref <30)
Microalb, Ur: 47.6 mg/dL

## 2022-12-25 LAB — PSA: PSA: 0.15 ng/mL (ref <=4.00)

## 2022-12-28 ENCOUNTER — Encounter (HOSPITAL_COMMUNITY): Payer: Self-pay

## 2022-12-28 ENCOUNTER — Emergency Department (HOSPITAL_COMMUNITY)
Admission: EM | Admit: 2022-12-28 | Discharge: 2022-12-29 | Disposition: A | Discharge status: Home / Self Care | Payer: Medicare HMO | Attending: Emergency Medicine | Admitting: Emergency Medicine

## 2022-12-28 DIAGNOSIS — E1169 Type 2 diabetes mellitus with other specified complication: Secondary | ICD-10-CM

## 2022-12-28 DIAGNOSIS — Z7984 Long term (current) use of oral hypoglycemic drugs: Secondary | ICD-10-CM | POA: Diagnosis not present

## 2022-12-28 DIAGNOSIS — E1165 Type 2 diabetes mellitus with hyperglycemia: Secondary | ICD-10-CM | POA: Diagnosis not present

## 2022-12-28 DIAGNOSIS — R739 Hyperglycemia, unspecified: Secondary | ICD-10-CM

## 2022-12-28 DIAGNOSIS — R5383 Other fatigue: Secondary | ICD-10-CM | POA: Diagnosis not present

## 2022-12-28 LAB — CBC
HCT: 37.5 % — ABNORMAL LOW (ref 39.0–52.0)
Hemoglobin: 13.1 g/dL (ref 13.0–17.0)
MCH: 29.8 pg (ref 26.0–34.0)
MCHC: 34.9 g/dL (ref 30.0–36.0)
MCV: 85.4 fL (ref 80.0–100.0)
Platelets: 298 10*3/uL (ref 150–400)
RBC: 4.39 MIL/uL (ref 4.22–5.81)
RDW: 12.3 % (ref 11.5–15.5)
WBC: 9.6 10*3/uL (ref 4.0–10.5)
nRBC: 0 % (ref 0.0–0.2)

## 2022-12-28 LAB — I-STAT CHEM 8, ED
BUN: 12 mg/dL (ref 8–23)
Calcium, Ion: 1.13 mmol/L — ABNORMAL LOW (ref 1.15–1.40)
Chloride: 97 mmol/L — ABNORMAL LOW (ref 98–111)
Creatinine, Ser: 1 mg/dL (ref 0.61–1.24)
Glucose, Bld: 461 mg/dL — ABNORMAL HIGH (ref 70–99)
HCT: 38 % — ABNORMAL LOW (ref 39.0–52.0)
Hemoglobin: 12.9 g/dL — ABNORMAL LOW (ref 13.0–17.0)
Potassium: 4.1 mmol/L (ref 3.5–5.1)
Sodium: 133 mmol/L — ABNORMAL LOW (ref 135–145)
TCO2: 23 mmol/L (ref 22–32)

## 2022-12-28 LAB — URINALYSIS, ROUTINE W REFLEX MICROSCOPIC
Bilirubin Urine: NEGATIVE
Glucose, UA: >=500 mg/dL — AB
Hgb urine dipstick: NEGATIVE
Ketones, ur: NEGATIVE mg/dL
Leukocytes,Ua: NEGATIVE
Nitrite: NEGATIVE
Protein, ur: 100 mg/dL — AB
Specific Gravity, Urine: 1.027 (ref 1.005–1.030)
pH: 6 (ref 5.0–8.0)

## 2022-12-28 LAB — CBG MONITORING, ED
Glucose-Capillary: 442 mg/dL — ABNORMAL HIGH (ref 70–99)
Glucose-Capillary: 482 mg/dL — ABNORMAL HIGH (ref 70–99)

## 2022-12-28 MED ORDER — SODIUM CHLORIDE 0.9 % IV BOLUS
1000.0000 mL | Freq: Once | INTRAVENOUS | Status: AC
  Administered 2022-12-28: 1000 mL via INTRAVENOUS

## 2022-12-28 NOTE — ED Provider Notes (Signed)
[facility] EMERGENCY DEPARTMENT AT [facility] Provider Note   CSN: [id number] Arrival date & time: 12/28/22  1627     History  Chief Complaint  Patient presents with   Hyperglycemia    Daniel NEIDERHISER is a 67 y.o. male.  Patient sent to ED from PCP today for evaluation/treatment of high blood sugar. His appointment today was to become established with primary care and he denies any current acute symptoms. He reports being fatigued but that this is not new for him. No nausea, vomiting, pain. He has a known history of diabetes, off his Metformin for the past 2 years. He admits to being prescribed blood pressure medications as well but does not take any medications everyday. He states he does have fairly constant thirst and x3/night nocturia.   The history is provided by the spouse and the patient. No language interpreter was used.  Hyperglycemia      Home Medications Prior to Admission medications   Medication Sig Start Date End Date Taking? Authorizing Provider  metFORMIN (GLUCOPHAGE) 500 MG tablet Take 1 tablet (500 mg total) by mouth 2 (two) times daily with a meal. 12/29/22  Yes Jacori Mulrooney, PA-C  clopidogrel (PLAVIX) 75 MG tablet Take 1 tablet (75 mg total) by mouth daily. 12/24/22   Margarita Mail, DO  escitalopram (LEXAPRO) 5 MG tablet Take 1 tablet (5 mg total) by mouth daily. 12/24/22   Margarita Mail, DO  ezetimibe (ZETIA) 10 MG tablet Take 1 tablet (10 mg total) by mouth daily. 12/24/22 12/24/23  Margarita Mail, DO  fluticasone [facility]) 50 MCG/ACT nasal spray SPRAY 2 SPRAYS INTO EACH NOSTRIL EVERY DAY Patient not taking: Reported on 12/24/2022 07/08/20   Caro Laroche, DO  glucose blood (CONTOUR NEXT TEST) test strip USE AS INSTRUCTED TO CHECK BLOOD GLUCOSE ONCE DAILY*VERIFY METER WITH PT, NO ANSWER 10/30*NOTCOVERED Patient not taking: Reported on 12/24/2022 08/16/17   Cheryle Horsfall, NP  rosuvastatin (CRESTOR) 40 MG tablet Take 1 tablet (40 mg  total) by mouth every evening. 12/24/22   Margarita Mail, DO      Allergies    Patient has no known allergies.    Review of Systems   Review of Systems  Physical Exam Updated Vital Signs BP (!) 160/85   Pulse 83   Temp 98.3 F (36.8 C) (Oral)   Resp 17   SpO2 97%  Physical Exam Vitals and nursing note reviewed.  Constitutional:      General: He is not in acute distress.    Appearance: Normal appearance. He is not ill-appearing.  HENT:     Head: Normocephalic.     Mouth/Throat:     Mouth: Mucous membranes are dry.  Eyes:     Comments: Left corneal opacity, c/w history of left sided blindness.  Neck:     Vascular: No carotid bruit.  Cardiovascular:     Rate and Rhythm: Normal rate and regular rhythm.     Heart sounds: No murmur heard. Pulmonary:     Effort: Pulmonary effort is normal.     Breath sounds: No wheezing or rhonchi.  Abdominal:     Palpations: Abdomen is soft.     Tenderness: There is no abdominal tenderness.  Musculoskeletal:        General: Normal range of motion.     Cervical back: Normal range of motion and neck supple.  Skin:    General: Skin is warm and dry.  Neurological:     Mental Status: He is  alert and oriented to person, place, and time.     ED Results / Procedures / Treatments   Labs (all labs ordered are listed, but only abnormal results are displayed) Labs Reviewed  CBC - Abnormal; Notable for the following components:      Result Value   HCT 37.5 (*)    All other components within normal limits  URINALYSIS, ROUTINE W REFLEX MICROSCOPIC - Abnormal; Notable for the following components:   Glucose, UA >=500 (*)    Protein, ur 100 (*)    Bacteria, UA RARE (*)    All other components within normal limits  CBG MONITORING, ED - Abnormal; Notable for the following components:   Glucose-Capillary 442 (*)    All other components within normal limits  I-STAT CHEM 8, ED - Abnormal; Notable for the following components:   Sodium 133 (*)     Chloride 97 (*)    Glucose, Bld 461 (*)    Calcium, Ion 1.13 (*)    Hemoglobin 12.9 (*)    HCT 38.0 (*)    All other components within normal limits  CBG MONITORING, ED - Abnormal; Notable for the following components:   Glucose-Capillary 482 (*)    All other components within normal limits  CBG MONITORING, ED - Abnormal; Notable for the following components:   Glucose-Capillary 323 (*)    All other components within normal limits  COMPREHENSIVE METABOLIC PANEL    EKG EKG Interpretation Date/Time:  Tuesday December 28 2022 16:39:08 EDT Ventricular Rate:  102 PR Interval:  150 QRS Duration:  80 QT Interval:  330 QTC Calculation: 430 R Axis:   51  Text Interpretation: Sinus tachycardia Possible Left atrial enlargement Anterior infarct , age undetermined Abnormal ECG No previous ECGs available Confirmed by Fulton Reek [phone]) on 12/28/2022 10:14:51 PM  Radiology No results found.  Procedures Procedures    Medications Ordered in ED Medications  metFORMIN (GLUCOPHAGE) tablet 500 mg (has no administration in time range)  sodium chloride 0.9 % bolus 1,000 mL (0 mLs Intravenous Stopped 12/29/22 0007)    ED Course/ Medical Decision Making/ A&P Clinical Course as of 12/29/22 0013  Tue Dec 28, 2022  2221 Patient sent here from PCP office for hyperglycemia. No acute symptoms today. Noncompliant with all medications for >1 year, formerly on BID Metformin. Labs do not show evidence of acidosis, patient well appearing. Will provide IV fluids, recheck CBG. Restart Metformin. Encourage compliance with medications and PCP follow up.  [SU]  Wed Dec 29, 2022  0011 Recheck CBG after fluids 323. Will restart Metformin at 500 mg BID. Strongly encouraged PCP follow up and compliance with medications.  [SU]    Clinical Course User Index [SU] Elpidio Anis, PA-C                                 Medical Decision Making Amount and/or Complexity of Data Reviewed Labs:  ordered.  Risk Prescription drug management.           Final Clinical Impression(s) / ED Diagnoses Final diagnoses:  Hyperglycemia    Rx / DC Orders ED Discharge Orders          Ordered    metFORMIN (GLUCOPHAGE) 500 MG tablet  2 times daily with meals        12/29/22 0010              Elpidio Anis, PA-C 12/29/22 0013  Laurence Spates, MD 01/01/23 [phone]

## 2022-12-28 NOTE — ED Triage Notes (Signed)
Pt is coming from a pcp office in which he had a reported bgl of 421. He des not express any current pain but expresses feeling generally tired and weak. He is supposed to be taking metformin but has not been taking his medication for two years.

## 2022-12-29 DIAGNOSIS — E1165 Type 2 diabetes mellitus with hyperglycemia: Secondary | ICD-10-CM | POA: Diagnosis not present

## 2022-12-29 LAB — CBG MONITORING, ED: Glucose-Capillary: 323 mg/dL — ABNORMAL HIGH (ref 70–99)

## 2022-12-29 MED ORDER — METFORMIN HCL 500 MG PO TABS
500.0000 mg | ORAL_TABLET | Freq: Once | ORAL | Status: AC
  Administered 2022-12-29: 500 mg via ORAL
  Filled 2022-12-29: qty 1

## 2022-12-29 MED ORDER — METFORMIN HCL 500 MG PO TABS
500.0000 mg | ORAL_TABLET | Freq: Two times a day (BID) | ORAL | 0 refills | Status: AC
Start: 2022-12-29 — End: ?

## 2022-12-29 NOTE — Discharge Instructions (Signed)
Take Metformin twice daily as prescribed. Please follow up with your primary care provider for continuous treatment.   Return to the ED with any new or concerning symptoms at any time.

## 2023-01-10 ENCOUNTER — Other Ambulatory Visit: Payer: Self-pay | Admitting: Nurse Practitioner

## 2023-01-10 DIAGNOSIS — J8410 Pulmonary fibrosis, unspecified: Secondary | ICD-10-CM

## 2023-01-12 ENCOUNTER — Encounter: Payer: Self-pay | Admitting: Nurse Practitioner

## 2023-01-20 ENCOUNTER — Other Ambulatory Visit: Payer: Self-pay | Admitting: Internal Medicine

## 2023-01-20 ENCOUNTER — Ambulatory Visit
Admission: RE | Admit: 2023-01-20 | Discharge: 2023-01-20 | Disposition: A | Discharge status: Home / Self Care | Payer: Medicare HMO | Source: Ambulatory Visit | Attending: Nurse Practitioner | Admitting: Nurse Practitioner

## 2023-01-20 DIAGNOSIS — F419 Anxiety disorder, unspecified: Secondary | ICD-10-CM

## 2023-01-20 DIAGNOSIS — J8410 Pulmonary fibrosis, unspecified: Secondary | ICD-10-CM

## 2023-01-21 NOTE — Telephone Encounter (Signed)
Requested medication (s) are due for refill today:   Yes  New med  Requested medication (s) are on the active medication list:   Yes  Future visit scheduled:   Yes 03/29/2023   Last ordered: 12/24/2022 #30, 1 refill  Returned because phar. Requesting a 90 day supply and a DX Code.   Requested Prescriptions  Pending Prescriptions Disp Refills   escitalopram (LEXAPRO) 5 MG tablet [Pharmacy Med Name: ESCITALOPRAM 5 MG TABLET] 90 tablet 1    Sig: Take 1 tablet (5 mg total) by mouth daily.     Psychiatry:  Antidepressants - SSRI Passed - 01/20/2023  2:31 PM      Passed - Completed PHQ-2 or PHQ-9 in the last 360 days      Passed - Valid encounter within last 6 months    Recent Outpatient Visits           4 weeks ago Pure hypercholesterolemia   [facility] Margarita Mail, DO   2 years ago Hypertension, unspecified type   [facility] Caro Laroche, DO   2 years ago Type 2 diabetes mellitus with hyperlipidemia [facility])   [facility] Caro Laroche, DO   2 years ago Anxiety   [facility] Jamelle Haring, MD   3 years ago Bronchitis   [facility] Jamelle Haring, MD       Future Appointments             In 2 months Margarita Mail, DO [facility]

## 2023-02-17 ENCOUNTER — Other Ambulatory Visit (INDEPENDENT_AMBULATORY_CARE_PROVIDER_SITE_OTHER): Payer: Self-pay | Admitting: Nurse Practitioner

## 2023-02-17 DIAGNOSIS — I6523 Occlusion and stenosis of bilateral carotid arteries: Secondary | ICD-10-CM

## 2023-02-21 ENCOUNTER — Ambulatory Visit (INDEPENDENT_AMBULATORY_CARE_PROVIDER_SITE_OTHER): Payer: Medicare HMO

## 2023-02-21 ENCOUNTER — Encounter (INDEPENDENT_AMBULATORY_CARE_PROVIDER_SITE_OTHER): Payer: Self-pay | Admitting: Vascular Surgery

## 2023-02-21 ENCOUNTER — Ambulatory Visit (INDEPENDENT_AMBULATORY_CARE_PROVIDER_SITE_OTHER): Payer: Medicare HMO | Admitting: Vascular Surgery

## 2023-02-21 VITALS — BP 148/78 | HR 85 | Resp 16 | Wt 207.0 lb

## 2023-02-21 DIAGNOSIS — E782 Mixed hyperlipidemia: Secondary | ICD-10-CM

## 2023-02-21 DIAGNOSIS — E1169 Type 2 diabetes mellitus with other specified complication: Secondary | ICD-10-CM

## 2023-02-21 DIAGNOSIS — M1710 Unilateral primary osteoarthritis, unspecified knee: Secondary | ICD-10-CM | POA: Diagnosis not present

## 2023-02-21 DIAGNOSIS — I10 Essential (primary) hypertension: Secondary | ICD-10-CM

## 2023-02-21 DIAGNOSIS — I6523 Occlusion and stenosis of bilateral carotid arteries: Secondary | ICD-10-CM

## 2023-02-21 DIAGNOSIS — E785 Hyperlipidemia, unspecified: Secondary | ICD-10-CM

## 2023-02-21 NOTE — Progress Notes (Signed)
MRN : [id number]  Daniel Snyder is a 67 y.o. (1956-01-02) male who presents with chief complaint of check carotid arteries.  History of Present Illness:   The patient is seen for evaluation of carotid stenosis.   The patient denies amaurosis fugax. There is no recent history of TIA symptoms or focal motor deficits. There is a prior documented CVA with some residual right-sided weakness.  There is no history of migraine headaches. There is no history of seizures.  The patient is taking enteric-coated aspirin 81 mg daily.  No recent shortening of the patient's walking distance or new symptoms consistent with claudication.  No history of rest pain symptoms. No new ulcers or wounds of the lower extremities have occurred.  There is no history of DVT, PE or superficial thrombophlebitis. No recent episodes of angina or shortness of breath documented.   No outpatient medications have been marked as taking for the 02/21/23 encounter (Appointment) with [name], [name], MD.    Past Medical History:  Diagnosis Date   Anxiety    Arthritis    fingers   Diabetes mellitus without complication (HCC)    Hx of colonic polyps     Past Surgical History:  Procedure Laterality Date   COLONOSCOPY N/A 10/10/2014   Procedure: COLONOSCOPY;  Surgeon: [name], MD;  Location: [facility];  Service: Gastroenterology;  Laterality: N/A;  PT WANTS EARLY   POLYPECTOMY  10/10/2014   Procedure: POLYPECTOMY;  Surgeon: [name], MD;  Location: [facility];  Service: Gastroenterology;;    Social History Social History   Tobacco Use   Smoking status: Some Days    Current packs/day: 0.25    Average packs/day: 0.3 packs/day for 37.0 years (9.3 ttl pk-yrs)    Types: Cigarettes   Smokeless tobacco: Never   Tobacco comments:    1 cigarette daily  Vaping Use   Vaping status: Never Used  Substance Use Topics   Alcohol use: Yes    Alcohol/week: 4.0 standard  drinks of alcohol    Types: 4 Cans of beer per week   Drug use: No    Family History Family History  Problem Relation Age of Onset   COPD Mother    Cancer Father    Emphysema Father    Diabetes Sister    Heart attack Brother    Heart disease Paternal Grandfather    Heart attack Paternal Grandfather     No Known Allergies   REVIEW OF SYSTEMS (Negative unless checked)  Constitutional: [] Weight loss  [] Fever  [] Chills Cardiac: [] Chest pain   [] Chest pressure   [] Palpitations   [] Shortness of breath when laying flat   [] Shortness of breath with exertion. Vascular:  [x] Pain in legs with walking   [] Pain in legs at rest  [] History of DVT   [] Phlebitis   [] Swelling in legs   [] Varicose veins   [] Non-healing ulcers Pulmonary:   [] Uses home oxygen   [] Productive cough   [] Hemoptysis   [] Wheeze  [] COPD   [] Asthma Neurologic:  [] Dizziness   [] Seizures   [] History of stroke   [] History of TIA  [] Aphasia   [] Vissual changes   [] Weakness or numbness in arm   [] Weakness or numbness in leg Musculoskeletal:   [] Joint swelling   [] Joint pain   [] Low back pain Hematologic:  [] Easy bruising  [] Easy bleeding   [] Hypercoagulable  state   [] Anemic Gastrointestinal:  [] Diarrhea   [] Vomiting  [] Gastroesophageal reflux/heartburn   [] Difficulty swallowing. Genitourinary:  [] Chronic kidney disease   [] Difficult urination  [] Frequent urination   [] Blood in urine Skin:  [] Rashes   [] Ulcers  Psychological:  [] History of anxiety   []  History of major depression.  Physical Examination  There were no vitals filed for this visit. There is no height or weight on file to calculate BMI. Gen: WD/WN, NAD Head: [place]/AT, No temporalis wasting.  Ear/Nose/Throat: Hearing grossly intact, nares w/o erythema or drainage Eyes: PER, EOMI, sclera nonicteric.  Neck: Supple, no masses.  No bruit or JVD.  Pulmonary:  Good air movement, no audible wheezing, no use of accessory muscles.  Cardiac: RRR, normal S1, S2, no  Murmurs. Vascular:  carotid bruit noted left Vessel Right Left  Radial Palpable Palpable  Carotid  Palpable  Palpable  Subclav  Palpable Palpable  Gastrointestinal: soft, non-distended. No guarding/no peritoneal signs.  Musculoskeletal: M/S 5/5 throughout.  No visible deformity.  Neurologic: CN 2-12 intact. Pain and light touch intact in extremities.  Symmetrical.  Speech is fluent. Motor exam as listed above. Psychiatric: Judgment intact, Mood & affect appropriate for pt's clinical situation. Dermatologic: No rashes or ulcers noted.  No changes consistent with cellulitis.   CBC Lab Results  Component Value Date   WBC 9.6 12/28/2022   HGB 12.9 (L) 12/28/2022   HCT 38.0 (L) 12/28/2022   MCV 85.4 12/28/2022   PLT 298 12/28/2022    BMET    Component Value Date/Time   NA 133 (L) 12/28/2022 1654   K 4.1 12/28/2022 1654   CL 97 (L) 12/28/2022 1654   CO2 27 12/24/2022 1103   GLUCOSE 461 (H) 12/28/2022 1654   BUN 12 12/28/2022 1654   CREATININE 1.00 12/28/2022 1654   CREATININE 1.15 12/24/2022 1103   CALCIUM 9.9 12/24/2022 1103   GFRNONAA 82 11/01/2019 0918   GFRAA 95 11/01/2019 0918   CrCl cannot be calculated (Patient's most recent lab result is older than the maximum 21 days allowed.).  COAG No results found for: "INR", "PROTIME"  Radiology No results found.   Assessment/Plan 1. Bilateral carotid artery stenosis Recommend:  Given the patient's asymptomatic subcritical stenosis no further invasive testing or surgery at this time.  Duplex ultrasound shows RICA 1-39% and LICA occlusion (known) stenosis bilaterally.  Continue antiplatelet therapy as prescribed Continue management of CAD, HTN and Hyperlipidemia Healthy heart diet,  encouraged exercise at least 4 times per week  Follow up in 12 months with duplex ultrasound and physical exam  - VAS US CAROTID; Future  2. Primary hypertension Continue antihypertensive medications as already ordered, these  medications have been reviewed and there are no changes at this time.  3. Type 2 diabetes mellitus with hyperlipidemia (HCC) Continue hypoglycemic medications as already ordered, these medications have been reviewed and there are no changes at this time.  Hgb A1C to be monitored as already arranged by primary service  4. Primary osteoarthritis of knee, unspecified laterality Continue medications to treat the patient's degenerative disease as already ordered, these medications have been reviewed and there are no changes at this time.  Continued activity and therapy was stressed.  5. Mixed hyperlipidemia Continue statin as ordered and reviewed, no changes at this time    Levora Dredge, MD  02/21/2023 1:28 PM

## 2023-02-27 ENCOUNTER — Encounter (INDEPENDENT_AMBULATORY_CARE_PROVIDER_SITE_OTHER): Payer: Self-pay | Admitting: Vascular Surgery

## 2023-02-27 DIAGNOSIS — I6529 Occlusion and stenosis of unspecified carotid artery: Secondary | ICD-10-CM | POA: Insufficient documentation

## 2023-03-28 NOTE — Progress Notes (Unsigned)
Established Patient Office Visit  Subjective   Patient ID: Daniel Snyder, male    DOB: 1955/06/20  Age: 67 y.o. MRN: [id number]  No chief complaint on file.   HPI  Patient here for follow up on chronic medical conditions.   Hypertension/Dysautonomia: -Medications: None -Patient is compliant with above medications and reports no side effects. -Checking BP at home (average):  -Denies any SOB, CP, vision changes, LE edema or symptoms of hypotension  HLD/Carotid Artery Stenosis/Hx of CVA: -Medications: Restarted on Plavix, Crestor and Zetia at LOV -Patient is compliant with above medications and reports no side effects.  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    Patient Active Problem List   Diagnosis Date Noted   Carotid stenosis 02/27/2023   Dysautonomia (HCC) 07/08/2020   Hypertension 06/12/2020   Tobacco dependence 11/01/2019   Swelling of both lower extremities 11/01/2019   Vision loss of left eye 11/01/2019   Right sided weakness 03/19/2019   Insomnia 03/19/2019   Current moderate episode of major depressive disorder without  prior episode (HCC) 03/19/2019   Carotid artery stenosis, symptomatic, left 01/23/2018   Class 1 obesity due to excess calories with serious comorbidity and body mass index (BMI) of 34.0 to 34.9 in adult 12/16/2017   Mixed hyperlipidemia 08/11/2016   Vitamin D deficiency 08/11/2016   Type 2 diabetes mellitus with hyperlipidemia (HCC) 05/05/2016   Paronychia of finger of right hand 03/09/2016   Osteoarthritis of knee 09/08/2015   Olecranon bursitis 09/08/2015   Swelling of right elbow joint 08/27/2015   Right knee pain 08/27/2015   Numbness in feet 08/27/2015   Anxiety 08/27/2015   Rectal polyp    Past Medical History:  Diagnosis Date   Anxiety    Arthritis    fingers   Diabetes mellitus without complication (HCC)    Hx of colonic polyps    Past Surgical History:  Procedure Laterality Date   COLONOSCOPY N/A 10/10/2014   Procedure: COLONOSCOPY;  Surgeon: [name], MD;  Location: [facility];  Service: Gastroenterology;  Laterality: N/A;  PT WANTS EARLY   POLYPECTOMY  10/10/2014   Procedure: POLYPECTOMY;  Surgeon: [name], MD;  Location: [facility];  Service: Gastroenterology;;   Social History   Tobacco Use   Smoking status: Some Days    Current packs/day: 0.25    Average packs/day: 0.3 packs/day for 37.0 years (9.3 ttl pk-yrs)    Types: Cigarettes   Smokeless tobacco: Never   Tobacco comments:    1 cigarette daily  Vaping Use   Vaping status: Never Used  Substance Use Topics   Alcohol use: Yes    Alcohol/week: 4.0 standard drinks of alcohol    Types: 4 Cans of beer per week   Drug use: No   Social History   Socioeconomic History   Marital status: Single    Spouse name: Not on file   Number of children: 1   Years of education: Not on file   Highest education level: Not on file  Occupational History   Not on file  Tobacco Use   Smoking status: Some Days    Current packs/day: 0.25    Average packs/day: 0.3 packs/day for 37.0 years (9.3  ttl pk-yrs)    Types: Cigarettes   Smokeless tobacco: Never   Tobacco comments:    1 cigarette daily  Vaping Use   Vaping status: Never Used  Substance and Sexual Activity   Alcohol use: Yes    Alcohol/week: 4.0 standard drinks of alcohol    Types: 4 Cans of beer per week   Drug use: No   Sexual activity: Not Currently    Partners: Female  Other Topics Concern   Not on file  Social History Narrative   Not on file   Social Determinants of Health   Financial Resource Strain: Low Risk  (05/18/2018)   Overall Financial Resource Strain (CARDIA)    Difficulty of Paying Living Expenses: Not hard at all  Food Insecurity: No Food Insecurity (05/18/2018)   Hunger Vital Sign    Worried About Running Out of Food in the Last Year: Never true    Ran Out of Food in the Last Year: Never true  Transportation Needs: No Transportation Needs (05/18/2018)   PRAPARE - Administrator, Civil Service (Medical): No    Lack of Transportation (Non-Medical): No  Physical Activity: Inactive (05/18/2018)   Exercise Vital Sign    Days of Exercise per Week: 0 days    Minutes of Exercise per Session: 0 min  Stress: No Stress Concern Present (05/18/2018)   [employer] of Occupational Health - Occupational Stress Questionnaire    Feeling of Stress : Not at all  Social Connections: Moderately Isolated (05/18/2018)   Social Connection and Isolation Panel [NHANES]    Frequency of Communication with Friends and Family: Twice a week    Frequency of Social Gatherings with Friends and Family: Twice a week    Attends Religious Services: Never    Database administrator or Organizations: No    Attends Banker Meetings: Never    Marital Status: Divorced  Catering manager Violence: Not At Risk (05/18/2018)   Humiliation, Afraid, Rape, and Kick questionnaire    Fear of Current or Ex-Partner: No    Emotionally Abused: No    Physically Abused: No    Sexually Abused: No   Family Status   Relation Name Status   Mother  Deceased   Father  Deceased   Sister  Alive   Brother  Alive   Son  Alive   MGM never met Deceased   MGF never met Deceased   PGM  Deceased   PGF  Deceased at age 45       heart attack  No partnership data on file   Family History  Problem Relation Age of Onset   COPD Mother    Cancer Father    Emphysema Father    Diabetes Sister    Heart attack Brother    Heart disease Paternal Grandfather    Heart attack  Paternal Grandfather    No Known Allergies    Review of Systems  Constitutional:  Negative for chills and fever.  Respiratory:  Negative for shortness of breath.   Cardiovascular:  Negative for chest pain.      Objective:     There were no vitals taken for this visit. BP Readings from Last 3 Encounters:  02/21/23 (!) 148/78  12/29/22 (!) 155/74  12/24/22 (!) 142/86   Wt Readings from Last 3 Encounters:  02/21/23 207 lb (93.9 kg)  12/24/22 214 lb 12.8 oz (97.4 kg)  07/08/20 236 lb 6.4 oz (107.2 kg)      Physical Exam Constitutional:      Appearance: Normal appearance.  HENT:     Head: Normocephalic and atraumatic.     Mouth/Throat:     Mouth: Mucous membranes are moist.     Pharynx: Oropharynx is clear.  Neck:     Vascular: Carotid bruit present.  Cardiovascular:     Rate and Rhythm: Normal rate and regular rhythm.  Pulmonary:     Effort: Pulmonary effort is normal.     Breath sounds: Normal breath sounds.  Musculoskeletal:     Right lower leg: No edema.     Left lower leg: No edema.  Skin:    General: Skin is warm and dry.  Neurological:     General: No focal deficit present.     Mental Status: He is alert. Mental status is at baseline.  Psychiatric:        Mood and Affect: Mood normal.        Behavior: Behavior normal.      No results found for any visits on 03/29/23.  Last CBC Lab Results  Component Value Date   WBC 9.6 12/28/2022   HGB 12.9 (L) 12/28/2022   HCT 38.0 (L) 12/28/2022   MCV 85.4  12/28/2022   MCH 29.8 12/28/2022   RDW 12.3 12/28/2022   PLT 298 12/28/2022   Last metabolic panel Lab Results  Component Value Date   GLUCOSE 461 (H) 12/28/2022   NA 133 (L) 12/28/2022   K 4.1 12/28/2022   CL 97 (L) 12/28/2022   CO2 27 12/24/2022   BUN 12 12/28/2022   CREATININE 1.00 12/28/2022   GFRNONAA 82 11/01/2019   CALCIUM 9.9 12/24/2022   PROT 7.4 12/24/2022   ALBUMIN 4.1 05/05/2016   BILITOT 0.5 12/24/2022   ALKPHOS 69 05/05/2016   AST 8 (L) 12/24/2022   ALT 7 (L) 12/24/2022   Last lipids Lab Results  Component Value Date   CHOL 239 (H) 12/24/2022   HDL 36 (L) 12/24/2022   LDLCALC 151 (H) 12/24/2022   TRIG 337 (H) 12/24/2022   CHOLHDL 6.6 (H) 12/24/2022   Last hemoglobin A1c Lab Results  Component Value Date   HGBA1C 11.2 (H) 12/24/2022   Last thyroid functions Lab Results  Component Value Date   TSH 1.37 05/05/2016   Last vitamin D Lab Results  Component Value Date   VD25OH 28 (L) 11/15/2017   Last vitamin B12 and Folate No results found for: "VITAMINB12", "FOLATE"    The ASCVD Risk score (Arnett DK, et al., 2019) failed to calculate for the following reasons:   The patient has a prior MI or stroke diagnosis    Assessment & Plan:   1. Pure hypercholesterolemia/Carotid artery stenosis, symptomatic, left/Vision loss of left eye/PAD (peripheral artery disease) (HCC)/History of CVA (cerebrovascular accident): Recheck lipids, restart Crestor 40 mg, Plavix and Zetia. Referral back to vascular  placed. Jury duty letter given, handicap placard form filled out. Blood pressure borderline, will recheck at follow up.  - ezetimibe (ZETIA) 10 MG tablet; Take 1 tablet (10 mg total) by mouth daily.  Dispense: 90 tablet; Refill: 3 - rosuvastatin (CRESTOR) 40 MG tablet; Take 1 tablet (40 mg total) by mouth every evening.  Dispense: 90 tablet; Refill: 3 - Lipid Profile - ezetimibe (ZETIA) 10 MG tablet; Take 1 tablet (10 mg total) by mouth daily.  Dispense: 90  tablet; Refill: 3 - Ambulatory referral to Vascular Surgery  2. Type 2 diabetes mellitus with hyperlipidemia (HCC): Labs due, recheck A1c and urine.   - CBC w/Diff/Platelet - COMPLETE METABOLIC PANEL WITH GFR - Lipid Profile - HgB A1c - Urine Microalbumin w/creat. ratio  3. Anxiety: Restart Lexapro 5 mg.  - escitalopram (LEXAPRO) 5 MG tablet; Take 1 tablet (5 mg total) by mouth daily.  Dispense: 30 tablet; Refill: 1  4. Prostate cancer screening: Screening labs added on.  - PSA  5. Need for influenza vaccination: Flu vaccine administered today.   - Flu vaccine trivalent PF, 6mos and older(Flulaval,Afluria,Fluarix,Fluzone)   No follow-ups on file.    Margarita Mail, DO

## 2023-03-29 ENCOUNTER — Ambulatory Visit: Payer: BC Managed Care – PPO | Admitting: Internal Medicine

## 2023-05-09 ENCOUNTER — Encounter: Payer: Self-pay | Admitting: Podiatry

## 2023-05-09 ENCOUNTER — Ambulatory Visit: Payer: Medicare HMO | Admitting: Podiatry

## 2023-05-09 ENCOUNTER — Ambulatory Visit (INDEPENDENT_AMBULATORY_CARE_PROVIDER_SITE_OTHER): Payer: Medicare HMO

## 2023-05-09 DIAGNOSIS — E1149 Type 2 diabetes mellitus with other diabetic neurological complication: Secondary | ICD-10-CM

## 2023-05-09 DIAGNOSIS — L97522 Non-pressure chronic ulcer of other part of left foot with fat layer exposed: Secondary | ICD-10-CM | POA: Diagnosis not present

## 2023-05-09 DIAGNOSIS — L03119 Cellulitis of unspecified part of limb: Secondary | ICD-10-CM | POA: Diagnosis not present

## 2023-05-09 DIAGNOSIS — M778 Other enthesopathies, not elsewhere classified: Secondary | ICD-10-CM

## 2023-05-09 MED ORDER — AMOXICILLIN-POT CLAVULANATE 875-125 MG PO TABS: 1.0000 | ORAL_TABLET | Freq: Two times a day (BID) | ORAL | 0 refills | Status: DC

## 2023-05-09 MED ORDER — MUPIROCIN 2 % EX OINT: 1.0000 | TOPICAL_OINTMENT | Freq: Two times a day (BID) | CUTANEOUS | 2 refills | Status: DC

## 2023-05-10 NOTE — Progress Notes (Signed)
 Subjective:  Patient ID: Daniel Snyder, male    DOB: November 27, 1955,  MRN: [id number]  Chief Complaint  Patient presents with   Callouses    RM#13 Left foot callus causing pain and has a black spot.    Discussed the use of AI scribe software for clinical note transcription with the patient, who gave verbal consent to proceed.  History of Present Illness         68 y.o. male , presents with a left foot callus that has been present for a couple of months. He denies any known injury or prior treatment. He has been using a callus remover on the area. He reports sharp pains in his feet when sitting around and that his feet are cold all the time. His last known A1c was 11.2 in August of the previous year, but he reports that his blood sugar has been well controlled since a hospitalization for hyperglycemia. He denies any other open wounds on his feet.     Objective:    Physical Exam          General: AAO x3, NAD  Dermatological: On the left foot submetatarsal 5 is a hyperkeratotic lesion with what appears to be an old blister which his peeling around it. Upon debridement there is a granular ulceration noted centrally measuring 0.3 x 0.3 x 0.1 cm. There is no probing, undermining, tunneling. There is some slight edema and erythema around the area. No malodor. No other open lesions.    Vascular: Dorsalis Pedis artery and Posterior Tibial artery pedal pulses are 2/4 bilateral with immedate capillary fill time.  There is no pain with calf compression, swelling, warmth, erythema.   Neruologic: Sensation decreased left worse then right.   Musculoskeletal: No pain on exam, but he does have neuropathy  Gait: Unassisted, Nonantalgic.   No images are attached to the encounter.    Results          Assessment:   Ulcer left foot, localized infection   Plan:  Patient was evaluated and treated and all questions answered.  Assessment and Plan          Foot Ulcer Open wound on left foot  present for a couple of months, possibly secondary to callus removal. No signs of deep infection or foreign body but there is some localized edema and erythema, although mild. Noted decreased sensation in both feet, worse on the left, possibly due to diabetic neuropathy or other nerve pathology. -Medically necessary wound debridement was preformed today. A 315 blade scalpel was utilized to sharply debride the nonviable, devitalized tissue to reveal the underlying ulcer. Difficult to measure the ulcer prior to debridement. Post-debridement measurements noted above. No blood loss noted. Cleaned with saline. Antibiotic ointment and bandage applied.  -Start Augmentin  and Mupirocin  ointment, both to be picked up at [facility] in [place] on [address] -Apply offloading pads to reduce pressure on the wound. Dispensed dancer pads to help decrease pressure.  -Follow-up in 2 weeks to assess wound healing. -Advise patient to contact the office or go to the hospital if there are signs of infection (increased redness, swelling, fever, chills).  Diabetes Mellitus Last A1c was 11.2 in August of the previous year, recent blood glucose was 170. Patient has neuropathy and a foot ulcer, indicating poor control. -Recommend follow-up with primary care provider for diabetes management and possible re-evaluation of back pain due to asymmetric neuropathy, or other pathologies.   Radiology: 3 views of the left foot obtained. No evidence  of acute fracture. No cortical destruction to suggest osteomyelitis. No foreign body noted. Calcaneal spurring noted.      Return in about 2 weeks (around 05/23/2023) for ulcer left foot.

## 2023-05-23 ENCOUNTER — Ambulatory Visit: Payer: Medicare HMO | Admitting: Podiatry

## 2023-05-23 DIAGNOSIS — L03119 Cellulitis of unspecified part of limb: Secondary | ICD-10-CM | POA: Diagnosis not present

## 2023-05-23 DIAGNOSIS — L97522 Non-pressure chronic ulcer of other part of left foot with fat layer exposed: Secondary | ICD-10-CM | POA: Diagnosis not present

## 2023-05-23 MED ORDER — DOXYCYCLINE HYCLATE 100 MG PO TABS: 100.0000 mg | ORAL_TABLET | Freq: Two times a day (BID) | ORAL | 0 refills | Status: DC

## 2023-05-23 NOTE — Patient Instructions (Signed)
Monitor for any signs/symptoms of infection. Call the office immediately if any occur or go directly to the emergency room. Call with any questions/concerns.

## 2023-05-25 NOTE — Progress Notes (Signed)
  Subjective:  Patient ID: Daniel Snyder, male    DOB: [birth year],  MRN: [id number]  No chief complaint on file.   68 y.o. male , presents for follow evaluation of left foot ulceration.  Finished his course of antibiotics.  Does describe discomfort to the area.  Denies any drainage or pus.  It is tender with pressure.  No fevers or chills.  No other concerns.   Objective:    Physical Exam          General: AAO x3, NAD  Dermatological: There is a tissue with UNDERNEATH this noted.  I was able to debride this area and purulent drainage was expressed which was cultured.  See procedure note below.  After debrided the ulceration was 0.8 x 0.5 x 0.4 cm.  No probing, undermining or tunneling.  Still pain with erythema but no ascending cellulitis.  Noted but no crepitation or fluctuation noted.    Vascular: Dorsalis Pedis artery and Posterior Tibial artery pedal pulses are 2/4 bilateral with immedate capillary fill time.  There is no pain with calf compression, swelling, warmth, erythema.   Neruologic: Sensation decreased left worse then right.   Musculoskeletal: No pain on exam, but he does have neuropathy  Gait: Unassisted, Nonantalgic.   No images are attached to the encounter.    Results          Assessment:   Ulcer left foot, localized infection   Plan:  Patient was evaluated and treated and all questions answered.  Assessment and Plan          Foot Ulcer -Medically necessary wound debridement was preformed today. A 315 blade scalpel was utilized to sharply debride the nonviable, devitalized tissue to reveal the underlying ulcer.  I was able to remove purulent drainage which was cultured today.  After debridement there is no further purulence or drainage noted.  There is no sign of blood loss.  The area saline.  Antibiotic ointment is applied followed by dressing.  Continue with daily dressing changes.  Continue to recommend dressing change daily. -CAM boot for  immobilization, offloading.  Cellulitis/abscess -Doxycycline -ABI ordered -Blood work ordered including CBC, sed rate, CRP.  If abnormal likely order MRI. -CAM dispensed verbalization, offloading  Return in about 2 weeks (around 06/06/2023).  Vivi Barrack DPM

## 2023-05-26 LAB — CBC WITH DIFFERENTIAL/PLATELET
Basophils Absolute: 0 10*3/uL (ref 0.0–0.2)
Basos: 1 %
EOS (ABSOLUTE): 0.2 10*3/uL (ref 0.0–0.4)
Eos: 3 %
Hematocrit: 40.2 % (ref 37.5–51.0)
Hemoglobin: 13.4 g/dL (ref 13.0–17.7)
Immature Grans (Abs): 0 10*3/uL (ref 0.0–0.1)
Immature Granulocytes: 0 %
Lymphocytes Absolute: 1.6 10*3/uL (ref 0.7–3.1)
Lymphs: 19 %
MCH: 30.9 pg (ref 26.6–33.0)
MCHC: 33.3 g/dL (ref 31.5–35.7)
MCV: 93 fL (ref 79–97)
Monocytes Absolute: 0.5 10*3/uL (ref 0.1–0.9)
Monocytes: 6 %
Neutrophils Absolute: 6 10*3/uL (ref 1.4–7.0)
Neutrophils: 71 %
Platelets: 281 10*3/uL (ref 150–450)
RBC: 4.33 x10E6/uL (ref 4.14–5.80)
RDW: 12.1 % (ref 11.6–15.4)
WBC: 8.4 10*3/uL (ref 3.4–10.8)

## 2023-05-26 LAB — WOUND CULTURE
MICRO NUMBER:: 16001726
SPECIMEN QUALITY:: ADEQUATE

## 2023-05-26 LAB — C-REACTIVE PROTEIN: CRP: 9 mg/L (ref 0–10)

## 2023-05-26 LAB — SEDIMENTATION RATE: Sed Rate: 66 mm/h — ABNORMAL HIGH (ref 0–30)

## 2023-05-31 ENCOUNTER — Encounter: Payer: Self-pay | Admitting: Podiatry

## 2023-06-06 ENCOUNTER — Encounter: Payer: Self-pay | Admitting: Podiatry

## 2023-06-06 ENCOUNTER — Ambulatory Visit: Payer: Medicare HMO | Admitting: Podiatry

## 2023-06-06 DIAGNOSIS — L97522 Non-pressure chronic ulcer of other part of left foot with fat layer exposed: Secondary | ICD-10-CM | POA: Diagnosis not present

## 2023-06-06 NOTE — Progress Notes (Signed)
  Subjective:  Patient ID: Daniel Snyder, male    DOB: 1955/06/27,  MRN: [id number]  Chief Complaint  Patient presents with   Foot Ulcer    RM#11 Left foot ulcer follow up patient states doing well then he was no redness no signs of infection.No pain at this time feels better .     68 y.o. male  presents for follow evaluation of left foot ulceration.  He states he is doing much better.  He is wearing cam boot all the time.  Not see any drainage or pus.  No present swelling or redness.  He denies any fevers or chills.  No other concerns today.    Objective:    Physical Exam        General: AAO x3, NAD  Dermatological: Hyperkeratotic lesion noted submetatarsal 5.  Upon debridement there is 1 small area of bleeding a superficial area of skin breakdown but overall the area is doing much better.  There is no drainage or pus noted today.  There is no fluctuation or crepitation.  There is no odor.  No significant pain on exam today.  No new ulcerations are identified.  Vascular: Dorsalis Pedis artery and Posterior Tibial artery pedal pulses are 2/4 bilateral with immedate capillary fill time.  There is no pain with calf compression, swelling, warmth, erythema.   Neruologic: Sensation decreased left worse then right.   Musculoskeletal: No pain on exam, but he does have neuropathy  Gait: Unassisted, Nonantalgic.   No images are attached to the encounter.    Results          Assessment:   Ulcer left foot, localized infection   Plan:  Patient was evaluated and treated and all questions answered.  Assessment and Plan          Foot Ulcer -Medically necessary wound debridement was preformed today. A 315 blade scalpel was utilized to sharply debride the nonviable, devitalized tissue to reveal the underlying ulcer.  I was not able to measure the wound prior to debridement after debridement was measuring 0.2 x 0.2 x 0.1 cm and superficial.  There is no purulence or drainage noted  today.  There is minimal bleeding.  Hemostasis achieved for manual compression.  He tolerated the procedure well and complications.  Recommended continue antibiotic ointment dressing changes daily. -CAM boot for immobilization, offloading.  Cellulitis/abscess -Much improved.  No signs of infection today. -ABI ordered-not yet completed -Blood work ordered including CBC, sed rate, CRP-reviewed with the patient.  Clinically doing much better. -Wound culture grew skin flora. -CAM dispensed verbalization, offloading  Return in about 2 weeks (around 06/20/2023) for left foot ulcer.  Vivi Barrack DPM

## 2023-06-20 ENCOUNTER — Ambulatory Visit: Payer: Medicare HMO | Admitting: Podiatry

## 2023-06-20 ENCOUNTER — Encounter: Payer: Self-pay | Admitting: Podiatry

## 2023-06-20 DIAGNOSIS — E1149 Type 2 diabetes mellitus with other diabetic neurological complication: Secondary | ICD-10-CM | POA: Diagnosis not present

## 2023-06-20 DIAGNOSIS — L03119 Cellulitis of unspecified part of limb: Secondary | ICD-10-CM | POA: Diagnosis not present

## 2023-06-20 DIAGNOSIS — L97522 Non-pressure chronic ulcer of other part of left foot with fat layer exposed: Secondary | ICD-10-CM | POA: Diagnosis not present

## 2023-06-20 NOTE — Progress Notes (Unsigned)
  Subjective:  Patient ID: Daniel Snyder, male    DOB: 10-Jul-1955,  MRN: [id number]  Chief Complaint  Patient presents with   Diabetic Ulcer    RM#13 Follow up on left foot ulcer no signs of infection.      68 y.o. male  presents for follow evaluation of left foot ulceration.  States has been doing well.  No pain.  No swelling redness or any drainage.  No recent injuries.  Has no other concerns.  He has remained in the cam boot the majority of the time.  No new concerns.    Objective:    Physical Exam        General: AAO x3, NAD  Dermatological: Hyperkeratotic lesion noted submetatarsal 5.  Upon debridement there is no open lesion identified.  The area is preulcerative but overall doing much better.  There is no edema, erythema or signs of infection.  There is no new open lesions identified.    Vascular: Dorsalis Pedis artery and Posterior Tibial artery pedal pulses are 2/4 bilateral with immedate capillary fill time.  There is no pain with calf compression, swelling, warmth, erythema.   Neruologic: Sensation decreased left worse then right.   Musculoskeletal: No pain on exam, but he does have neuropathy. Prominent metatarsal head.  Gait: Unassisted, Nonantalgic.        Assessment:   Ulcer left foot, localized infection   Plan:  Patient was evaluated and treated and all questions answered.  Assessment and Plan      Foot Ulcer with resolved cellulitis -Ulceration base of heel.  Sharply debrided hyperkeratotic tissue without any complications or bleeding.  Currently no signs of infection.  Discussed he can gradually start to transition to regular shoe as tolerated.  Continue offloading.  Dispensed metatarsal pads (I dispensed the grey ones today).  -Discussed the importance of daily foot inspection should there be any recurrence or new issues or open lesions or signs of infection return to the cam boot limit now.  Return in about 4 weeks (around 07/18/2023) for  ulcer.  Vivi Barrack DPM

## 2023-06-23 ENCOUNTER — Other Ambulatory Visit: Payer: Self-pay | Admitting: Internal Medicine

## 2023-06-23 DIAGNOSIS — F419 Anxiety disorder, unspecified: Secondary | ICD-10-CM

## 2023-06-23 NOTE — Telephone Encounter (Signed)
 Requested medications are due for refill today.  yes  Requested medications are on the active medications list.  yes  Last refill. 01/21/2023 #90 1 rf  Future visit scheduled.   no  Notes to clinic.  PT last seen 12/24/2022 Pt is more than 3 motnsh overdue for an OV. PT no showed last appt on 12/32024.No pcp listed.   Requested Prescriptions  Pending Prescriptions Disp Refills   escitalopram (LEXAPRO) 5 MG tablet [Pharmacy Med Name: ESCITALOPRAM 5 MG TABLET] 90 tablet 1    Sig: TAKE 1 TABLET (5 MG TOTAL) BY MOUTH DAILY.     Psychiatry:  Antidepressants - SSRI Failed - 06/23/2023  4:37 PM      Failed - Valid encounter within last 6 months    Recent Outpatient Visits           6 months ago Pure hypercholesterolemia   [facility] Margarita Mail, DO   2 years ago Hypertension, unspecified type   [facility] Caro Laroche, DO   3 years ago Type 2 diabetes mellitus with hyperlipidemia [facility])   [facility] Caro Laroche, DO   3 years ago Anxiety   [facility] Jamelle Haring, MD   3 years ago Bronchitis   [facility] Jamelle Haring, MD              Passed - Completed PHQ-2 or PHQ-9 in the last 360 days

## 2023-06-30 ENCOUNTER — Telehealth (HOSPITAL_COMMUNITY): Payer: Self-pay

## 2023-06-30 NOTE — Telephone Encounter (Signed)
 Called to schedule ordered VAS [place] study. No answer. Left message. Second attempt. Provided direct contact number to schedule appointment.

## 2023-07-18 ENCOUNTER — Encounter: Payer: Self-pay | Admitting: Podiatry

## 2023-07-18 ENCOUNTER — Ambulatory Visit: Payer: Medicare HMO | Admitting: Podiatry

## 2023-07-18 DIAGNOSIS — L97522 Non-pressure chronic ulcer of other part of left foot with fat layer exposed: Secondary | ICD-10-CM

## 2023-07-18 DIAGNOSIS — E1149 Type 2 diabetes mellitus with other diabetic neurological complication: Secondary | ICD-10-CM

## 2023-07-19 ENCOUNTER — Telehealth (HOSPITAL_COMMUNITY): Payer: Self-pay

## 2023-07-19 NOTE — Telephone Encounter (Signed)
 Attempted to contact the patient to schedule VAS [place].  No answer.  Left message.  Third Attempt. Provided direct contact number for scheduling: [phone].

## 2023-07-21 NOTE — Progress Notes (Signed)
  Subjective:  Patient ID: Daniel Snyder, male    DOB: 02/16/1956,  MRN: [id number] Chief Complaint  Patient presents with   Foot Ulcer    RM#12 Left foot ulcer area looks in a good state of healing patient has no concerns.       68 y.o. male  presents for follow evaluation of left foot ulceration.  States he has been doing well.  Denies any reoccurrence.  No pain.  No swelling, redness or any drainage.  No fevers or chills.  No other concerns.     Objective:    Physical Exam        General: AAO x3, NAD  Dermatological: There is minimal hyperkeratotic tissue noted submetatarsal 5.  Upon debridement there is no underlying ulceration, drainage or any signs of infection.  There is no new lesions identified.   Vascular: Dorsalis Pedis artery and Posterior Tibial artery pedal pulses are palpable bilateral with immedate capillary fill time.  There is no pain with calf compression, swelling, warmth, erythema.   Neruologic: Sensation decreased left worse then right.   Musculoskeletal: No pain on exam, but he does have neuropathy. Prominent metatarsal head.  Gait: Unassisted, Nonantalgic.        Assessment:   Ulcer left foot, localized infection   Plan:  Patient was evaluated and treated and all questions answered.  Assessment and Plan      Foot Ulcer with resolved cellulitis -Ulceration is healed.  There is minimal hyperkeratotic tissue which I sharply debrided today without any complications or bleeding.  Continue moisturizer, offloading.  Discussed importance daily foot inspection.  Should there be any reoccurrence or further issues swelling immediately.  Verbalized understanding.  No further questions today.  Vivi Barrack DPM

## 2023-09-13 ENCOUNTER — Encounter (INDEPENDENT_AMBULATORY_CARE_PROVIDER_SITE_OTHER): Payer: Self-pay

## 2023-09-27 ENCOUNTER — Ambulatory Visit: Admitting: Podiatry

## 2023-09-27 DIAGNOSIS — M216X2 Other acquired deformities of left foot: Secondary | ICD-10-CM

## 2023-09-27 DIAGNOSIS — E1149 Type 2 diabetes mellitus with other diabetic neurological complication: Secondary | ICD-10-CM | POA: Diagnosis not present

## 2023-09-27 DIAGNOSIS — L84 Corns and callosities: Secondary | ICD-10-CM

## 2023-09-27 DIAGNOSIS — Z872 Personal history of diseases of the skin and subcutaneous tissue: Secondary | ICD-10-CM | POA: Diagnosis not present

## 2023-09-27 NOTE — Progress Notes (Unsigned)
 Subjective: Chief Complaint  Patient presents with   Foot Pain    Sub 5th MPJ left - small, callused area x 1-2 weeks, tender walking, "feels like a rock", diabetic - 70.47    69 year old male presents the office with above concerns.  Along with the wound at previously on the left foot submetatarsal 5 he has noted a hard spot which is caused pain.  Not see any skin breakdown or any ulcerations or any drainage.  He has not had any recent treatment for this.  No injuries or stepping any foreign objects that he recalls.  Objective: AAO x3, NAD DP/PT pulses palpable bilaterally, CRT less than 3 seconds On the left foot submetatarsal 5 is a hyperkeratotic lesion.  Upon debridement there is no underlying ulceration, drainage or any signs of infection noted today.  There is prominent metatarsal head plantarly. No pain with calf compression, swelling, warmth, erythema  Assessment: Left foot preulcerative callus with prominent metatarsal head history of ulceration  Plan: -All treatment options discussed with the patient including all alternatives, risks, complications.  -I sharply debrided hyperkeratotic lesion with any complications or bleeding.  Discussed moisturizer, offloading.  I dispensed metatarsal pads to apply today. -I do think he will benefit from diabetic shoes with custom orthotics to offload to help facilitate healing and prevent further skin breakdown, ulcerations and infection.  This will be sent to [facility]. -Patient encouraged to call the office with any questions, concerns, change in symptoms.   Return in about 4 weeks (around 10/25/2023) for pre-ulcerative callus.  Charity Conch DPM

## 2023-09-27 NOTE — Patient Instructions (Signed)

## 2023-10-27 ENCOUNTER — Ambulatory Visit: Admitting: Podiatry

## 2023-10-27 DIAGNOSIS — E1149 Type 2 diabetes mellitus with other diabetic neurological complication: Secondary | ICD-10-CM

## 2023-10-27 DIAGNOSIS — L84 Corns and callosities: Secondary | ICD-10-CM

## 2023-10-27 DIAGNOSIS — M216X2 Other acquired deformities of left foot: Secondary | ICD-10-CM | POA: Diagnosis not present

## 2023-10-31 NOTE — Progress Notes (Signed)
 Subjective: Chief Complaint  Patient presents with   Foot Ulcer    RM#13 Follow up on left foot ulcer in good healing state patient has no concerns at this time.     68 year old male presents the office with above concerns.  He states that the spot on the side of the left foot is starting to cause some pain, but no open lesions. No swelling or redness.   No injuries or new concerns today.   Objective: AAO x3, NAD DP/PT pulses palpable bilaterally, CRT less than 3 seconds Left foot submetatarsal 5 is a hyperkeratotic lesion.  Upon debridement there is no underlying ulceration, drainage or any signs of infection noted today.  There is prominent metatarsal head plantarly. No pain with calf compression, swelling, warmth, erythema  Assessment: Left foot preulcerative callus with prominent metatarsal head history of ulceration  Plan: -All treatment options discussed with the patient including all alternatives, risks, complications.  -I sharply debrided hyperkeratotic lesion with any complications or bleeding.  Discussed moisturizer, offloading.  I added offloading pads to the inserts in his shoes to help further offload.  -Previously ordered diabetic shoes through [facility].  -Daily foot inspection. -Patient encouraged to call the office with any questions, concerns, change in symptoms.   Return in about 3 months (around 01/27/2024) for pre-ulcerative callus.  Donnice JONELLE Fees DPM

## 2024-01-19 ENCOUNTER — Ambulatory Visit: Admitting: Podiatry

## 2024-01-19 VITALS — Ht 69.0 in | Wt 207.0 lb

## 2024-01-19 DIAGNOSIS — L84 Corns and callosities: Secondary | ICD-10-CM

## 2024-01-19 DIAGNOSIS — E1149 Type 2 diabetes mellitus with other diabetic neurological complication: Secondary | ICD-10-CM

## 2024-01-19 NOTE — Progress Notes (Signed)
 Subjective: Chief Complaint  Patient presents with   Callouses    Rm 13 Patient is here for a pre-ulcerative callus on plantar aspect of the left foot.   68 year old male presents the office with above concerns.  He states that the spot on the side of the left foot is starting to cause some discomfort but does not report any open lesions.  No swelling redness or drainage.  Still waiting diabetic shoes, inserts.  Objective: AAO x3, NAD DP/PT pulses palpable bilaterally, CRT less than 3 seconds Left foot submetatarsal 5 is a hyperkeratotic lesion.  Upon debridement there is no underlying ulceration, drainage or any signs of infection noted today.  There is prominent metatarsal head plantarly. No pain with calf compression, swelling, warmth, erythema  Assessment: Left foot preulcerative callus with prominent metatarsal head history of ulceration  Plan: -All treatment options discussed with the patient including all alternatives, risks, complications.  -I sharply debrided hyperkeratotic lesion with any complications or bleeding.  Discussed moisturizer, offloading.   -Previously ordered diabetic shoes-we will refax an order for this. -Daily foot inspection. -Patient encouraged to call the office with any questions, concerns, change in symptoms.   Return in about 3 months (around 04/19/2024).  Donnice JONELLE Fees DPM

## 2024-01-27 ENCOUNTER — Ambulatory Visit: Admitting: Podiatry

## 2024-02-23 ENCOUNTER — Ambulatory Visit (INDEPENDENT_AMBULATORY_CARE_PROVIDER_SITE_OTHER): Payer: Medicare HMO | Admitting: Nurse Practitioner

## 2024-02-23 ENCOUNTER — Encounter (INDEPENDENT_AMBULATORY_CARE_PROVIDER_SITE_OTHER): Payer: Medicare HMO

## 2024-04-17 ENCOUNTER — Ambulatory Visit: Admitting: Podiatry

## 2024-04-17 VITALS — Ht 69.0 in | Wt 207.0 lb

## 2024-04-17 DIAGNOSIS — M216X2 Other acquired deformities of left foot: Secondary | ICD-10-CM | POA: Diagnosis not present

## 2024-04-17 DIAGNOSIS — L84 Corns and callosities: Secondary | ICD-10-CM | POA: Diagnosis not present

## 2024-04-17 DIAGNOSIS — E1149 Type 2 diabetes mellitus with other diabetic neurological complication: Secondary | ICD-10-CM

## 2024-04-17 NOTE — Progress Notes (Signed)
 Subjective: Chief Complaint  Patient presents with   Diabetes    RM 12 DFC   Callouses    Rm 12 Patient is here for callus on the left foot (lateral). Callus has a dark discoloration. Patient states observing color change one week ago. (A1C 6.8).    68 year old male presents the office with above concerns.  He has been doing well except about a week ago the calcifications that he noticed some darkened discoloration under the callus.  He has not seen any bleeding, drainage.  Does not report any fevers or chills.  No recent swelling or redness.  No other concerns.   Objective: AAO x3, NAD-presents wearing cam boot DP/PT pulses palpable bilaterally, CRT less than 3 seconds Sensation decreased Left foot submetatarsal 5 is a hyperkeratotic lesion.  There is dried blood present under the callus.  Upon debride this still small dried blood present under the skin and the area is preulcerative but there is no further skin breakdown.  There is no edema, erythema.  There is no fluctuation or crepitation.  There is no malodor.  No signs of infection today.  Prominent metatarsal head present.  No pain with calf compression, swelling, warmth, erythema  Assessment: Left foot preulcerative callus with prominent metatarsal head history of ulceration  Plan: -Sharply debrided the hyperkeratotic lesion with any complications or bleeding.  Discussed moisturizer, offloading.  He states he has paperwork to be filled out for his shoes, inserts and hoping that by the office for us .  Discussed long-term we got a get the pressure off to help prevent reoccurrence of the wound, infection.  Long-term consider surgical intervention if symptoms persist.  Daily foot inspection.  Continue the cam boot for now until the area has resolved.  Return for pre-ulcerative lesion in 4-6 weeks.  Donnice JONELLE Fees DPM

## 2024-05-01 ENCOUNTER — Other Ambulatory Visit: Payer: Self-pay | Admitting: Internal Medicine

## 2024-05-01 DIAGNOSIS — Z136 Encounter for screening for cardiovascular disorders: Secondary | ICD-10-CM

## 2024-05-01 DIAGNOSIS — F17210 Nicotine dependence, cigarettes, uncomplicated: Secondary | ICD-10-CM

## 2024-05-01 DIAGNOSIS — E782 Mixed hyperlipidemia: Secondary | ICD-10-CM

## 2024-05-02 ENCOUNTER — Encounter: Payer: Self-pay | Admitting: Internal Medicine

## 2024-05-02 DIAGNOSIS — E782 Mixed hyperlipidemia: Secondary | ICD-10-CM

## 2024-05-02 DIAGNOSIS — Z136 Encounter for screening for cardiovascular disorders: Secondary | ICD-10-CM

## 2024-05-21 ENCOUNTER — Ambulatory Visit: Admitting: Podiatry

## 2024-05-30 ENCOUNTER — Ambulatory Visit
